# Patient Record
Sex: Male | Born: 1990 | Race: White | Hispanic: No | Marital: Single | Smoking: Current some day smoker
Health system: Southern US, Community
[De-identification: ages and names within clinical notes are randomized; demographics above are authoritative.]

## PROBLEM LIST (undated history)

## (undated) DIAGNOSIS — F4310 Post-traumatic stress disorder, unspecified: Secondary | ICD-10-CM

## (undated) DIAGNOSIS — F329 Major depressive disorder, single episode, unspecified: Secondary | ICD-10-CM

## (undated) DIAGNOSIS — T1491XA Suicide attempt, initial encounter: Secondary | ICD-10-CM

## (undated) DIAGNOSIS — F319 Bipolar disorder, unspecified: Secondary | ICD-10-CM

## (undated) DIAGNOSIS — K219 Gastro-esophageal reflux disease without esophagitis: Secondary | ICD-10-CM

## (undated) DIAGNOSIS — R519 Headache, unspecified: Secondary | ICD-10-CM

## (undated) DIAGNOSIS — E079 Disorder of thyroid, unspecified: Secondary | ICD-10-CM

## (undated) DIAGNOSIS — F32A Depression, unspecified: Secondary | ICD-10-CM

## (undated) DIAGNOSIS — Z87442 Personal history of urinary calculi: Secondary | ICD-10-CM

## (undated) DIAGNOSIS — E039 Hypothyroidism, unspecified: Secondary | ICD-10-CM

## (undated) DIAGNOSIS — R51 Headache: Secondary | ICD-10-CM

## (undated) DIAGNOSIS — T50901A Poisoning by unspecified drugs, medicaments and biological substances, accidental (unintentional), initial encounter: Secondary | ICD-10-CM

## (undated) HISTORY — DX: Poisoning by unspecified drugs, medicaments and biological substances, accidental (unintentional), initial encounter: T50.901A

---
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
## 2010-06-12 LAB — ETHANOL: Alcohol, Ethyl (B): <5 mg/dL (ref 0–10)

## 2010-06-12 LAB — URINALYSIS, ROUTINE W REFLEX MICROSCOPIC
Bilirubin Urine: NEGATIVE
Glucose, UA: NEGATIVE mg/dL
Ketones, ur: NEGATIVE mg/dL
pH: 6 (ref 5.0–8.0)

## 2010-06-12 LAB — CBC
HCT: 45.2 % (ref 39.0–52.0)
Platelets: 217 10*3/uL (ref 150–400)
RDW: 12.6 % (ref 11.5–15.5)

## 2010-06-12 LAB — DIFFERENTIAL
Basophils Absolute: 0.1 10*3/uL (ref 0.0–0.1)
Basophils Relative: 1 % (ref 0–1)
Eosinophils Absolute: 0.5 10*3/uL (ref 0.0–0.7)
Eosinophils Relative: 5 % (ref 0–5)
Lymphocytes Relative: 48 % — ABNORMAL HIGH (ref 12–46)

## 2010-06-12 LAB — POCT I-STAT, CHEM 8
BUN: 13 mg/dL (ref 6–23)
Sodium: 140 mEq/L (ref 135–145)
TCO2: 27 mmol/L (ref 0–100)

## 2010-06-12 LAB — RAPID URINE DRUG SCREEN, HOSP PERFORMED
Benzodiazepines: POSITIVE — AB
Cocaine: NOT DETECTED

## 2010-07-14 ENCOUNTER — Inpatient Hospital Stay: Payer: Self-pay | Admitting: Unknown Physician Specialty
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
## 2010-12-01 LAB — DIFFERENTIAL
Basophils Relative: 0
Lymphocytes Relative: 42
Monocytes Absolute: 0.9
Monocytes Relative: 7
Neutro Abs: 6.4

## 2010-12-01 LAB — DRUGS OF ABUSE SCREEN W/O ALC, ROUTINE URINE
Amphetamine Screen, Ur: NEGATIVE
Barbiturate Quant, Ur: NEGATIVE
Benzodiazepines.: NEGATIVE
Cocaine Metabolites: NEGATIVE
Methadone: NEGATIVE
Phencyclidine (PCP): NEGATIVE

## 2010-12-01 LAB — CBC
HCT: 46.2
Hemoglobin: 15.9
MCHC: 34.3
RBC: 5.32

## 2010-12-01 LAB — URINALYSIS, ROUTINE W REFLEX MICROSCOPIC
Bilirubin Urine: NEGATIVE
Glucose, UA: NEGATIVE
Hgb urine dipstick: NEGATIVE
Ketones, ur: NEGATIVE
pH: 6

## 2010-12-01 LAB — LIPID PANEL
LDL Cholesterol: 92
Triglycerides: 133

## 2010-12-01 LAB — HEPATIC FUNCTION PANEL
AST: 23
Albumin: 3.6
Alkaline Phosphatase: 63
Total Bilirubin: 0.9

## 2010-12-01 LAB — BASIC METABOLIC PANEL
CO2: 24
Calcium: 9.2
Potassium: 3.9
Sodium: 137

## 2010-12-01 LAB — HEMOGLOBIN A1C
Hgb A1c MFr Bld: 5.5
Mean Plasma Glucose: 119

## 2010-12-22 LAB — BASIC METABOLIC PANEL
BUN: 10
Chloride: 105
Creatinine, Ser: 0.81

## 2010-12-22 LAB — DRUGS OF ABUSE SCREEN W/O ALC, ROUTINE URINE
Amphetamine Screen, Ur: NEGATIVE
Creatinine,U: 284.2
Marijuana Metabolite: POSITIVE — AB
Propoxyphene: NEGATIVE

## 2010-12-22 LAB — URINALYSIS, ROUTINE W REFLEX MICROSCOPIC
Ketones, ur: NEGATIVE
Nitrite: NEGATIVE
Specific Gravity, Urine: 1.036 — ABNORMAL HIGH
pH: 6

## 2010-12-22 LAB — DIFFERENTIAL
Basophils Absolute: 0
Basophils Relative: 0
Eosinophils Absolute: 0.2
Neutrophils Relative %: 50

## 2010-12-22 LAB — GC/CHLAMYDIA PROBE AMP, URINE: Chlamydia, Swab/Urine, PCR: NEGATIVE

## 2010-12-22 LAB — OPIATE, QUANTITATIVE, URINE
Hydrocodone: 3000 ng/mL
Hydromorphone GC/MS Conf: NEGATIVE

## 2010-12-22 LAB — HEPATIC FUNCTION PANEL
Bilirubin, Direct: 0.2
Indirect Bilirubin: 0.6

## 2010-12-22 LAB — COMPREHENSIVE METABOLIC PANEL
AST: 25
Albumin: 3.5
Calcium: 9
Creatinine, Ser: 0.82

## 2010-12-22 LAB — TSH: TSH: 1.224

## 2010-12-22 LAB — LIPID PANEL
LDL Cholesterol: 77
Total CHOL/HDL Ratio: 2.8
Triglycerides: 81
VLDL: 16

## 2010-12-22 LAB — GAMMA GT: GGT: 26

## 2010-12-22 LAB — CBC
MCHC: 33.9
MCV: 85.6
Platelets: 282
WBC: 9.6

## 2010-12-22 LAB — T4, FREE: Free T4: 1.17

## 2011-12-09 LAB — COMPREHENSIVE METABOLIC PANEL
Albumin: 3.5 g/dL (ref 3.4–5.0)
Alkaline Phosphatase: 69 U/L (ref 50–136)
Calcium, Total: 7.8 mg/dL — ABNORMAL LOW (ref 8.5–10.1)
Chloride: 111 mmol/L — ABNORMAL HIGH (ref 98–107)
Co2: 24 mmol/L (ref 21–32)
Potassium: 3.6 mmol/L (ref 3.5–5.1)
SGOT(AST): 89 U/L — ABNORMAL HIGH (ref 15–37)
Sodium: 146 mmol/L — ABNORMAL HIGH (ref 136–145)
Total Protein: 6.5 g/dL (ref 6.4–8.2)

## 2011-12-09 LAB — DRUG SCREEN, URINE
Amphetamines, Ur Screen: NEGATIVE (ref ?–1000)
Benzodiazepine, Ur Scrn: NEGATIVE (ref ?–200)
Cannabinoid 50 Ng, Ur ~~LOC~~: NEGATIVE (ref ?–50)
Cocaine Metabolite,Ur ~~LOC~~: NEGATIVE (ref ?–300)
MDMA (Ecstasy)Ur Screen: NEGATIVE (ref ?–500)
Opiate, Ur Screen: NEGATIVE (ref ?–300)
Phencyclidine (PCP) Ur S: NEGATIVE (ref ?–25)

## 2011-12-09 LAB — TSH: Thyroid Stimulating Horm: 1.04 u[IU]/mL

## 2011-12-09 LAB — URINALYSIS, COMPLETE
Blood: NEGATIVE
Ketone: NEGATIVE
Nitrite: NEGATIVE
Protein: NEGATIVE
Specific Gravity: 1.002 (ref 1.003–1.030)
WBC UR: NONE SEEN /HPF (ref 0–5)

## 2011-12-09 LAB — CBC
HGB: 16 g/dL (ref 13.0–18.0)
MCH: 31.4 pg (ref 26.0–34.0)
RBC: 5.08 10*6/uL (ref 4.40–5.90)
WBC: 8.6 10*3/uL (ref 3.8–10.6)

## 2011-12-09 LAB — SALICYLATE LEVEL: Salicylates, Serum: 2.4 mg/dL

## 2011-12-09 LAB — ACETAMINOPHEN LEVEL: Acetaminophen: <2 ug/mL

## 2011-12-10 ENCOUNTER — Inpatient Hospital Stay: Payer: Self-pay | Admitting: Psychiatry

## 2011-12-10 LAB — ETHANOL: Ethanol %: 0.127 % — ABNORMAL HIGH (ref 0.000–0.080)

## 2013-01-02 ENCOUNTER — Ambulatory Visit: Payer: Self-pay

## 2013-01-12 ENCOUNTER — Ambulatory Visit: Payer: Self-pay

## 2013-04-19 ENCOUNTER — Ambulatory Visit: Payer: Self-pay | Admitting: Internal Medicine

## 2013-08-11 ENCOUNTER — Inpatient Hospital Stay: Payer: Self-pay | Admitting: Psychiatry

## 2013-08-11 LAB — COMPREHENSIVE METABOLIC PANEL
AST: 35 U/L (ref 15–37)
Albumin: 4.4 g/dL (ref 3.4–5.0)
Alkaline Phosphatase: 57 U/L
Anion Gap: 9 (ref 7–16)
BUN: 12 mg/dL (ref 7–18)
Bilirubin,Total: 0.3 mg/dL (ref 0.2–1.0)
CALCIUM: 8.6 mg/dL (ref 8.5–10.1)
CHLORIDE: 113 mmol/L — AB (ref 98–107)
CO2: 19 mmol/L — AB (ref 21–32)
Creatinine: 0.69 mg/dL (ref 0.60–1.30)
EGFR (African American): >60
EGFR (Non-African Amer.): >60
GLUCOSE: 103 mg/dL — AB (ref 65–99)
Osmolality: 281 (ref 275–301)
Potassium: 3.5 mmol/L (ref 3.5–5.1)
SGPT (ALT): 68 U/L (ref 12–78)
Sodium: 141 mmol/L (ref 136–145)
Total Protein: 7.7 g/dL (ref 6.4–8.2)

## 2013-08-11 LAB — URINALYSIS, COMPLETE
BACTERIA: NONE SEEN
Bilirubin,UR: NEGATIVE
Blood: NEGATIVE
Glucose,UR: NEGATIVE mg/dL (ref 0–75)
Ketone: NEGATIVE
LEUKOCYTE ESTERASE: NEGATIVE
Nitrite: NEGATIVE
Ph: 7 (ref 4.5–8.0)
Protein: NEGATIVE
RBC,UR: <1 /HPF (ref 0–5)
SPECIFIC GRAVITY: 1.002 (ref 1.003–1.030)
SQUAMOUS EPITHELIAL: NONE SEEN
WBC UR: <1 /HPF (ref 0–5)

## 2013-08-11 LAB — DRUG SCREEN, URINE

## 2013-08-11 LAB — SALICYLATE LEVEL: SALICYLATES, SERUM: 2.2 mg/dL

## 2013-08-11 LAB — CBC
HCT: 51.5 % (ref 40.0–52.0)
HGB: 17.4 g/dL (ref 13.0–18.0)
MCH: 30.1 pg (ref 26.0–34.0)
MCHC: 33.8 g/dL (ref 32.0–36.0)
MCV: 89 fL (ref 80–100)
PLATELETS: 200 10*3/uL (ref 150–440)
RBC: 5.79 10*6/uL (ref 4.40–5.90)
RDW: 14 % (ref 11.5–14.5)
WBC: 14.2 10*3/uL — AB (ref 3.8–10.6)

## 2013-08-11 LAB — ETHANOL
ETHANOL %: 0.151 % — AB (ref 0.000–0.080)
Ethanol: 151 mg/dL

## 2013-08-11 LAB — ACETAMINOPHEN LEVEL

## 2013-09-06 ENCOUNTER — Emergency Department: Payer: Self-pay | Admitting: Emergency Medicine

## 2013-09-06 LAB — CBC WITH DIFFERENTIAL/PLATELET
BASOS ABS: 0.1 10*3/uL (ref 0.0–0.1)
BASOS PCT: 0.4 %
EOS ABS: 0 10*3/uL (ref 0.0–0.7)
EOS PCT: 0 %
HCT: 53.3 % — AB (ref 40.0–52.0)
HGB: 18.3 g/dL — ABNORMAL HIGH (ref 13.0–18.0)
LYMPHS ABS: 2.4 10*3/uL (ref 1.0–3.6)
LYMPHS PCT: 11.3 %
MCH: 30.1 pg (ref 26.0–34.0)
MCHC: 34.4 g/dL (ref 32.0–36.0)
MCV: 88 fL (ref 80–100)
MONO ABS: 0.6 x10 3/mm (ref 0.2–1.0)
Monocyte %: 2.7 %
NEUTROS ABS: 18.1 10*3/uL — AB (ref 1.4–6.5)
NEUTROS PCT: 85.6 %
Platelet: 297 10*3/uL (ref 150–440)
RBC: 6.09 10*6/uL — ABNORMAL HIGH (ref 4.40–5.90)
RDW: 13.4 % (ref 11.5–14.5)
WBC: 21.2 10*3/uL — AB (ref 3.8–10.6)

## 2013-09-06 LAB — URINALYSIS, COMPLETE
Bacteria: NONE SEEN
Bilirubin,UR: NEGATIVE
Blood: NEGATIVE
Glucose,UR: NEGATIVE mg/dL (ref 0–75)
Leukocyte Esterase: NEGATIVE
Nitrite: NEGATIVE
Ph: 6 (ref 4.5–8.0)
Protein: 30
RBC,UR: 1 /HPF (ref 0–5)
Specific Gravity: 1.025 (ref 1.003–1.030)
Squamous Epithelial: 1
WBC UR: 1 /HPF (ref 0–5)

## 2013-09-07 LAB — COMPREHENSIVE METABOLIC PANEL
ALT: 63 U/L (ref 12–78)
ANION GAP: 9 (ref 7–16)
Albumin: 4.1 g/dL (ref 3.4–5.0)
Alkaline Phosphatase: 66 U/L
BILIRUBIN TOTAL: 0.8 mg/dL (ref 0.2–1.0)
BUN: 12 mg/dL (ref 7–18)
CREATININE: 0.8 mg/dL (ref 0.60–1.30)
Calcium, Total: 9.5 mg/dL (ref 8.5–10.1)
Chloride: 105 mmol/L (ref 98–107)
Co2: 25 mmol/L (ref 21–32)
EGFR (Non-African Amer.): >60
Glucose: 116 mg/dL — ABNORMAL HIGH (ref 65–99)
OSMOLALITY: 278 (ref 275–301)
POTASSIUM: 3 mmol/L — AB (ref 3.5–5.1)
SGOT(AST): 42 U/L — ABNORMAL HIGH (ref 15–37)
Sodium: 139 mmol/L (ref 136–145)
Total Protein: 8.1 g/dL (ref 6.4–8.2)

## 2013-09-07 LAB — LIPASE, BLOOD: LIPASE: 87 U/L (ref 73–393)

## 2014-05-01 ENCOUNTER — Emergency Department: Payer: Self-pay | Admitting: Emergency Medicine

## 2014-06-25 NOTE — H&P (Signed)
PATIENT NAME:  [name], [name] MR#:  [id number] DATE OF BIRTH:  1990/09/06  DATE OF ADMISSION:  12/10/2011  IDENTIFYING INFORMATION AND CHIEF COMPLAINT: This is a 24 year old man brought to the Emergency Room by his father because of excessive alcohol abuse.   CHIEF COMPLAINT: "I drank too much yesterday."   HISTORY OF PRESENT ILLNESS: Information obtained from the patient and from the chart. He was brought to the hospital by his father who reported that the patient has been drinking excessively ever since his birthday on 09/25/2011. The patient has been drinking almost constantly. He has become extremely dysfunctional. The patient admits to all of this today and me that he has been going through about four to five of the 1/2 gallon bottles of vodka each week. He has an impression that this has been an excessive amount. He says he does this because it helps him to relax. He admits that it has been causing him difficulties on several fronts. It has disrupted his sleep schedule and made his life schedule generally chaotic, makes him sick to his stomach, makes him feel tired, has cost him a great deal of money and has made it impossible for him to do much of anything else. The patient also claims that he has been using his prescribed medication correctly. He denies that he has been using any other drugs. He says that his mood recently has been mostly okay. He denies any suicidal or homicidal ideation. He denies hallucinations. He denies manic symptoms. He denies psychosis. He says that generally he feels like he gets along fairly well at home. He has been going to see Dr. Janeece RiggersSu for outpatient treatment. The patient claims his current medications are Lamictal 100 mg twice a day, Celexa 40 mg a day, Nexium 40 mg a day, Xanax 1 mg three times daily, and Synthroid 75 mcg a day.   PAST PSYCHIATRIC HISTORY: The patient has had a prior psychiatric hospitalization at our facility twice and has also been evaluated by  psychiatric consult. His history began in childhood and he has a history of attention deficit disorder symptoms and of being given a diagnosis of bipolar disorder as a child. More recently he has a history of abuse of multiple drugs. He has been hospitalized at least once for intentional overdose of drugs in the intention of getting high rather than killing himself. He was here last in May of 2012 and at that time was abusing Valium. It is not clear that he has any serious suicide attempts. He does not appear to have a history of violence in the past.   SOCIAL HISTORY: The patient is living with his biological parents. He not currently in school. He has been at [employer] in the past, but is not currently going to school or working. He says that his heavy drinking prevents him from doing much else. His social life seems to be pretty much limited to staying at home drinking heavily.   PAST MEDICAL HISTORY:  1. Hypothyroidism.  2. Gross obesity.  3. Gastric reflux symptoms.   SUBSTANCE ABUSE HISTORY: As noted above, he has a history of abuse of multiple drugs, especially benzodiazepines. Most recently has started drinking heavily. He seems to have a tendency to become rapidly out of control with substances when he does use them. He does not have a history of DTs or seizures because he has not yet had an opportunity to try to detox.   MEDICATIONS: As noted above, they appear  to be the following. 1. Lamictal 100 mg twice a day.  2. Celexa 40 mg per day.  3. Nexium 40 mg per day.  4. Xanax 1 mg three times daily. (I am not sure if I believe the Xanax. His drug screen is currently negative for benzodiazepines).  5. Synthroid 75 mcg per day.   ALLERGIES: Sulfa drugs and latex.   REVIEW OF SYSTEMS: When I see him today he complains of feeling hot and cold and shaky all over. Feeling very tired. Somewhat confused. Denies that he is having hallucinations. Denies suicidal or homicidal  ideation. Has felt somewhat sick to his stomach and had a hard time keeping food down.   MENTAL STATUS EXAMINATION: Extremely overweight young man. Very disheveled, dirty, and extremely malodorous. Passively cooperative with the interview. Eye contact decreased. Psychomotor activity sluggish. Speech decreased in total amount and slow. Affect constricted, a little blunted, but he does smile appropriately at times. Mood stated as feeling sick. Thoughts are slow, but there is no sign of loosening of associations or delusional content. He denies hallucinations. He denies any suicidal or homicidal ideation. Short and longer term memory both impaired. Overall intelligence appears to probably be about average. Judgment and insight have recently been poor, although he does understand and admits that he has been drinking too much.    PHYSICAL EXAMINATION:   GENERAL: The patient weighs 356 pounds. He is sluggish but does not appear to be in great physical distress. There are no skin lesions identified.   HEENT: Pupils are equal and reactive. Mucous membranes dry. Face is symmetric.   NECK AND BACK: Nontender.   MUSCULOSKELETAL: Full range of motion at all extremities. Strength and reflexes normal, upper and lower.   NEUROLOGICAL: Cranial nerves symmetric and normal.   LUNGS: Clear to auscultation.   HEART: Regular rate and rhythm. No extra sounds.   ABDOMEN: Obese, normal bowel sounds.   VITAL SIGNS: Blood pressure 131/59, respirations 18, pulse 79, and temperature 98.7.   ASSESSMENT: A 24 year old man who as a child was given the diagnoses of attention deficit/hyperactivity disorder and bipolar disorder but who as an adult seems to have primarily presented with substance abuse problems overwhelming everything else. Currently he has been drinking an extraordinary amount and presented to the Emergency Room with a blood alcohol level of almost 300. He currently has normal vital signs and is lucid and  not delirious. He is feeling sick but is not grossly shaky. He is already being treated with the alcohol withdrawal protocol. He needs detox treatment and consideration of further substance abuse treatment.   TREATMENT PLAN: Admit to the hospital and put him on the detox protocol. Continue the other medications, but not the Xanax. His benzodiazepine screen was negative which makes me a little bit less concerned about benzodiazepine withdrawal, but that should be covered by the CIWA protocol as well. Engage him in groups and activities, especially those oriented towards substance abuse. Try and get collateral history with the full treatment team. Engage him in planning for further substance abuse treatment.     DIAGNOSIS PRINCIPLE AND PRIMARY:   AXIS I: Alcohol dependence.   SECONDARY DIAGNOSES:   AXIS I:  1. Benzodiazepine dependence.  2. Bipolar disorder, not otherwise specified.   AXIS II: Deferred.   AXIS III: Morbid obesity, hypothyroidism.   AXIS IV: Severe from the heavy substance abuse, lack of any current social stimulation or activity.   AXIS V: Functioning at time of evaluation is 30. ____________________________  Audery Amel, MD jtc:slb D: 12/10/2011 16:04:00 ET T: 12/10/2011 16:33:08 ET JOB#: [id number]  cc: Audery Amel, MD, <Dictator> Audery Amel MD ELECTRONICALLY SIGNED 12/10/2011 17:10

## 2014-06-25 NOTE — Consult Note (Signed)
was seen for discharge disposition at the request of Attending, Audery AmelJohn T. Clapacs, MD to explore possibility of CD-IOP treatment at this inpatient discharge. Patient was positive for past treatment in the CD-IOP and was able to express desires of returning to treatment in the CD-IOP with goals of developing relapse prevention skills. Based on his history of substance abuse as well as distress in family, his management of emotional distress and his history of inability to remain abstinence the option of residential treatment for substance abuse treatment was also explored and may in fact be the better option upon discharge from this inpatient treatment. He insisted that the CD-IOP would be the best option for him. He was oriented to the CD-IOP. If discharge plans are for patient to participate in the CD-IOP he may began CD-IOP Friday December 17, 2011 at 4PM at which time he will sign all documents for admission into the CD-IOP.    Electronic Signatures: Huel Cotehomas, Richard (PsyD)  (Signed on 08-Oct-13 23:02)  Authored  Last Updated: 08-Oct-13 23:02 by Huel Cotehomas, Richard (PsyD)

## 2014-06-25 NOTE — Discharge Summary (Signed)
PATIENT NAME:  [name], [name] MR#:  [id number] DATE OF BIRTH:  Jul 27, 1990  DATE OF ADMISSION:  12/10/2011 DATE OF DISCHARGE:  12/15/2011  HOSPITAL COURSE: See dictated history and physical for details of admission. This 24 year old man was admitted to the hospital very intoxicated, somewhat agitated. He had been drinking a very large amount of vodka and had rapidly escalated his drinking habit since his 21st birthday a couple of months ago. He had been somewhat aggressive when he came into the hospital, incoherent, disorganized and potentially threatening. Family could not manage him. In the hospital, patient has not been violent or agitated or threatening. He was treated with the standard alcohol detox plan and has tolerated that well. No seizures or delirium tremens developed. He was able to detox readily over a few days with only minor shakiness and vital sign abnormalities. At this time his blood pressure has improved greatly. He was still running a bit of a high blood pressure even after other symptoms of detox had subsided and so was started on a low dose of antihypertensive medicine. He was engaged in daily individual therapy as well as therapy groups and educated about substance abuse problems. His affect and mood became euthymic and appropriate. He was treated with his usual outpatient medicines for depression and tolerated these well. A consultation was obtained with Dr. Maisie Fus from the intensive outpatient program who has worked with the patient before. Dr. Maisie Fus is willing to have the patient return to IOP and that will be the plan for follow up starting this Friday. Patient will also continue to follow up with Dr. Janeece Riggers in the community. We recommended to the patient that he can consider going to a residential program but he declined and is not interested at this point and would prefer to go to outpatient treatment.   LABORATORY, DIAGNOSTIC AND RADIOLOGICAL DATA: Admission labs showed a chemistry  panel with a glucose slightly elevated at 111, BUN low at 4, sodium 146, chloride 111, calcium 7.8, ALT 91, AST 89. Initial alcohol level in the Emergency Room was 296. TSH 1.04. Drug screen negative. Follow-up alcohol level after several hours was down to 127. CBC was normal. Urinalysis unremarkable. Acetaminophen and salicylates negative.   DISCHARGE MEDICATIONS:  1. Lisinopril 10 mg p.o. daily.  2. Trazodone 100 mg p.o. at bedtime.  3. Levothyroxine 75 mcg p.o. q.a.m.  4. Lamictal 100 mg b.i.d.  5. Nexium 40 mg b.i.d.  6. Celexa 40 mg per day.   MENTAL STATUS EXAM AT DISCHARGE: Casually but neatly dressed young man, looks his stated age. Cooperative and pleasant in the interaction. Good eye contact. Normal psychomotor activity. Speech normal in rate, tone, and volume. Affect euthymic, reactive, and appropriate. Mood stated as being much better. Thoughts are lucid with no sign of loosening of associations or delusional thinking. Denies any hallucinations. Denies suicidal or homicidal ideation. Normal intelligence is evident. Normal short-term and longer term memory with improved judgment and insight.   DISPOSITION: Discharge back home with his family. Follow up with IOP and with Dr. Janeece Riggers and also encourage him to get involved with Alcoholics Anonymous as a longer term program.   DIAGNOSIS PRINCIPLE AND PRIMARY:  AXIS I: Alcohol dependence.   SECONDARY DIAGNOSES:  AXIS I:  1. Depression, not otherwise specified, rule out bipolar disorder type II.  2. Benzodiazepine abuse.    AXIS II: Deferred.   AXIS III:  1. Hypertension. 2. Obesity. 3. Gastric reflux symptoms.   AXIS IV: Moderate to severe  stress from lack of a social life or much activity.   AXIS V: Functioning at time of discharge 60.   ____________________________ Audery AmelJohn T. Ilhan Debenedetto, MD jtc:cms D: 12/15/2011 12:36:16 ET T: 12/15/2011 13:09:21 ET JOB#: [id number] cc: Audery AmelJohn T. Rozalia Dino, MD, <Dictator> Audery AmelJOHN T Ankita Newcomer  MD ELECTRONICALLY SIGNED 12/15/2011 16:58

## 2014-06-29 NOTE — Consult Note (Signed)
Brief Consult Note: Diagnosis: Schizoaffective disorder.   Patient was seen by consultant.   Recommend further assessment or treatment.   Orders entered.   Comments: Psychotic patient to be admitted to psychiatry when bed available. No behavioral problems, accepts medication but is very disorganized and hallucinating.  Electronic Signatures: Kristine LineaPucilowska, Augustus Zurawski (MD)  (Signed 06-Jun-15 10:26)  Authored: Brief Consult Note   Last Updated: 06-Jun-15 10:26 by Kristine LineaPucilowska, Darleen Moffitt (MD)

## 2014-06-29 NOTE — H&P (Signed)
PATIENT NAME:  [name], [name] MR#:  [id number] DATE OF BIRTH:  04-Jun-1990  DATE OF ADMISSION:  08/11/2013  REFERRING PHYSICIAN: Emergency Room M.D.   ATTENDING PHYSICIAN: Devlyn Parish B. Jennet MaduroPucilowska, M.D.   IDENTIFYING DATA: [name] is a 24 year old male with history of bipolar disorder, PTSD and substance abuse.   CHIEF COMPLAINT: "I need help."   HISTORY OF PRESENT ILLNESS: [name] has a history of alcohol and opioid dependence. He was at [facility] about a year ago. He maintained sobriety up until one month ago when he relapsed on alcohol and pain killers. He is drinking a case of beer each day.  He has been taking up 300 mg of oxycodone daily, He steals oxycodone from his parents.  His mother has cancer and the father has pain issues as well. He became increasingly depressed and guilty about his substance abuse and decided to come to the hospital for help. He also has a history of bipolar illness and has been treated on and off since the age of 24. For the past two years, he has been in the care of Dr. Janeece RiggersSu at [facility]. He is being prescribed Lamictal and Celexa for bipolar and Xanax 1 mg 3 times a day for PTSD. He endorses many symptoms of depression with poor sleep, decreased appetite, anhedonia, feeling of guilt, hopelessness, worthlessness, poor memory and concentration, low energy, social isolation and decreased interest. He denies feeling suicide. He denies symptoms suggestive of bipolar mania. There are no psychotic symptoms. He denies IV drug use, but in the past, he did use heroin.   PAST PSYCHIATRIC HISTORY: First hospitalization at the age of 24 after an overdose on multiple opioids. He was hospitalized then. He was in the hospital 6 times about.  He went most often for substance abuse, detox or depression. He had one substance abuse rehab treatment for one year ago at [facility].   FAMILY PSYCHIATRIC HISTORY: There is no history of bipolar, substance abuse or  suicide in the family.   PAST MEDICAL HISTORY: None.   ALLERGIES: SULFA AND LATEX.   MEDICATIONS ON ADMISSION: Lamictal 150 mg twice daily, Celexa 40 mg daily, Xanax 1 mg 3 times daily.   SOCIAL HISTORY: Dropped out of school in the eleventh grade. He had some drug charges, went to jail and missed too much school and no GED. He lives with his parents. He has insurance through his parents. He denies any current legal charges.   REVIEW OF SYSTEMS:  CONSTITUTIONAL: Positive for chills from withdrawal and positive for gradual weight gain.  EYES: No double or blurred vision.  ENT: No hearing loss.  RESPIRATORY: No shortness of breath or cough.  CARDIOVASCULAR: No chest pain or orthopnea.  GASTROINTESTINAL: Positive for nausea and diarrhea.  GENITOURINARY: No incontinence or frequency.  ENDOCRINE: No heat or cold intolerance.  LYMPHATIC: No anemia or easy bruising.  INTEGUMENTARY: No acne or rash.  MUSCULOSKELETAL: Positive for muscle aches.  NEUROLOGIC: No tingling or weakness.  PSYCHIATRIC: See history of present illness for details.   PHYSICAL EXAMINATION: VITAL SIGNS:  Blood pressure 138/100, pulse 99, respirations 20, temperature 98.3.  GENERAL: This is an obese, young male in no acute distress.  HEENT: The pupils are equal, round, and reactive to light. Sclerae are anicteric.  NECK: Supple. No thyromegaly.  LUNGS: Clear to auscultation. No dullness to percussion.  HEART: Regular rhythm and rate. No murmurs, rubs, or gallops.  ABDOMEN: Soft, nontender, nondistended. Positive bowel sounds.  MUSCULOSKELETAL: Normal muscle strength in all extremities.  SKIN: No rashes or bruises.  LYMPHATIC: No cervical adenopathy.  NEUROLOGIC: Cranial nerves II through XII are intact.   LABORATORY DATA: Chemistries are within normal limits. Blood alcohol level is 0.151. LFTs within normal limits. Urine tox screen negative for substances. CBC within normal limits except for white blood count of  14.2. Urinalysis is not suggestive of urinary tract infection. Serum acetaminophen and salicylate are low.   MENTAL STATUS EXAMINATION ON ADMISSION: The patient is alert and oriented to person, place, time and situation. He is interviewed in his room he is in bed, feeling physically sick. He maintains limited eye contact. He is poorly groomed. His speech is slow. There is _____  of speech. His mood is depressed with flat affect. Thought process is logical and goal oriented. He denies thoughts of hurting himself or others. There are no delusions or paranoia. There are no auditory or visual hallucinations. His cognition is grossly intact. His registration, recall, short and long-term memory are intact. He is of average intelligence and fund of knowledge. His insight and judgment are poor.   SUICIDE RISK ASSESSMENT ON ADMISSION: This is a patient with a history of depression and mood instability, anxiety and substance use, who came to the hospital asking for opioid dependence and alcohol detox:   INITIAL DIAGNOSES:  AXIS I: Bipolar I disorder, depressed; alcohol dependence, opioid dependence.  AXIS II: Deferred.  AXIS III: Obesity. Opioids and alcohol withdrawal.  AXIS IV: Mental illness, substance abuse.  AXIS V: Global assessment of functioning: 35.   PLAN: The patient was admitted to [facility] unit for safety, stabilization and medication management. He was initially placed on suicide precautions and was closely monitored for any unsafe behaviors. He underwent full psychiatric and risk assessment. He received pharmacotherapy, individual and group psychotherapy, substance abuse counseling, and support from therapeutic milieu.  1. Alcohol detox: He is on the CIWA protocol, we will monitor for symptoms of withdrawal.  2. Opioid detox. He is treated symptomatically for opiates withdrawal. 3.  Mood. We will continue Lamictal and Celexa as prescribed by Dr. Janeece Riggers.   4. Anxiety: The patient claims that he has Xanax for PTSD, we will hold that.  5. Substance abuse treatment: The patient realizes that he should go back to substance abuse rehab, uncertain if he wants to do so.   DISPOSITION: To be established.    ____________________________ Ellin Goodie. Jennet Maduro, MD jbp:sg D: 08/12/2013 11:21:50 ET T: 08/12/2013 11:54:13 ET JOB#: [id number]  cc: Christyanna Mckeon B. Jennet Maduro, MD, <Dictator> Shari Prows MD ELECTRONICALLY SIGNED 08/15/2013 6:59

## 2014-09-24 ENCOUNTER — Emergency Department: Payer: Federal, State, Local not specified - PPO

## 2014-09-24 ENCOUNTER — Encounter: Payer: Self-pay | Admitting: Emergency Medicine

## 2014-09-24 ENCOUNTER — Emergency Department
Admission: EM | Admit: 2014-09-24 | Discharge: 2014-09-24 | Disposition: A | Discharge status: Home / Self Care | Payer: Federal, State, Local not specified - PPO | Attending: Emergency Medicine | Admitting: Emergency Medicine

## 2014-09-24 DIAGNOSIS — R109 Unspecified abdominal pain: Secondary | ICD-10-CM

## 2014-09-24 DIAGNOSIS — N200 Calculus of kidney: Secondary | ICD-10-CM | POA: Insufficient documentation

## 2014-09-24 DIAGNOSIS — Z720 Tobacco use: Secondary | ICD-10-CM | POA: Diagnosis not present

## 2014-09-24 HISTORY — DX: Disorder of thyroid, unspecified: E07.9

## 2014-09-24 LAB — COMPREHENSIVE METABOLIC PANEL
ALK PHOS: 55 U/L (ref 38–126)
ALT: 35 U/L (ref 17–63)
AST: 24 U/L (ref 15–41)
Albumin: 4.8 g/dL (ref 3.5–5.0)
Anion gap: 9 (ref 5–15)
BILIRUBIN TOTAL: 0.9 mg/dL (ref 0.3–1.2)
BUN: 10 mg/dL (ref 6–20)
CO2: 25 mmol/L (ref 22–32)
Calcium: 9 mg/dL (ref 8.9–10.3)
Chloride: 106 mmol/L (ref 101–111)
Creatinine, Ser: 1.04 mg/dL (ref 0.61–1.24)
GFR calc non Af Amer: >60 mL/min (ref >60)
Glucose, Bld: 109 mg/dL — ABNORMAL HIGH (ref 65–99)
POTASSIUM: 3.9 mmol/L (ref 3.5–5.1)
Sodium: 140 mmol/L (ref 135–145)
TOTAL PROTEIN: 7.6 g/dL (ref 6.5–8.1)

## 2014-09-24 LAB — URINALYSIS COMPLETE WITH MICROSCOPIC (ARMC ONLY)
Bilirubin Urine: NEGATIVE
Glucose, UA: NEGATIVE mg/dL
Leukocytes, UA: NEGATIVE
Nitrite: NEGATIVE
Protein, ur: 100 mg/dL — AB
SQUAMOUS EPITHELIAL / LPF: NONE SEEN
Specific Gravity, Urine: 1.024 (ref 1.005–1.030)
pH: 5 (ref 5.0–8.0)

## 2014-09-24 LAB — CBC
HEMATOCRIT: 51.7 % (ref 40.0–52.0)
Hemoglobin: 17.2 g/dL (ref 13.0–18.0)
MCH: 29.5 pg (ref 26.0–34.0)
MCHC: 33.3 g/dL (ref 32.0–36.0)
MCV: 88.6 fL (ref 80.0–100.0)
Platelets: 201 10*3/uL (ref 150–440)
RBC: 5.83 MIL/uL (ref 4.40–5.90)
RDW: 14 % (ref 11.5–14.5)
WBC: 12.7 10*3/uL — ABNORMAL HIGH (ref 3.8–10.6)

## 2014-09-24 MED ORDER — KETOROLAC TROMETHAMINE 10 MG PO TABS
10.0000 mg | ORAL_TABLET | Freq: Once | ORAL | Status: AC
  Administered 2014-09-24: 10 mg via ORAL
  Filled 2014-09-24: qty 1

## 2014-09-24 MED ORDER — KETOROLAC TROMETHAMINE 30 MG/ML IJ SOLN
INTRAMUSCULAR | Status: AC
  Administered 2014-09-24: 30 mg via INTRAVENOUS
  Filled 2014-09-24: qty 1

## 2014-09-24 MED ORDER — KETOROLAC TROMETHAMINE 30 MG/ML IJ SOLN
30.0000 mg | Freq: Once | INTRAMUSCULAR | Status: AC
  Administered 2014-09-24: 30 mg via INTRAVENOUS

## 2014-09-24 MED ORDER — ONDANSETRON HCL 4 MG/2ML IJ SOLN
4.0000 mg | Freq: Once | INTRAMUSCULAR | Status: AC
Start: 2014-09-24 — End: 2014-09-24
  Administered 2014-09-24: 4 mg via INTRAVENOUS

## 2014-09-24 MED ORDER — ONDANSETRON HCL 4 MG/2ML IJ SOLN
4.0000 mg | Freq: Once | INTRAMUSCULAR | Status: AC
  Administered 2014-09-24: 4 mg via INTRAVENOUS

## 2014-09-24 MED ORDER — ONDANSETRON HCL 4 MG/2ML IJ SOLN
INTRAMUSCULAR | Status: AC
  Administered 2014-09-24: 4 mg via INTRAVENOUS
  Filled 2014-09-24: qty 2

## 2014-09-24 MED ORDER — KETOROLAC TROMETHAMINE 10 MG PO TABS: 10.0000 mg | ORAL_TABLET | Freq: Three times a day (TID) | ORAL | Status: DC | PRN

## 2014-09-24 MED ORDER — MORPHINE SULFATE 4 MG/ML IJ SOLN
4.0000 mg | Freq: Once | INTRAMUSCULAR | Status: AC
  Administered 2014-09-24: 4 mg via INTRAVENOUS

## 2014-09-24 MED ORDER — SODIUM CHLORIDE 0.9 % IV BOLUS (SEPSIS)
1000.0000 mL | Freq: Once | INTRAVENOUS | Status: AC
  Administered 2014-09-24: 1000 mL via INTRAVENOUS

## 2014-09-24 MED ORDER — MORPHINE SULFATE 4 MG/ML IJ SOLN
INTRAMUSCULAR | Status: AC
  Administered 2014-09-24: 4 mg via INTRAVENOUS
  Filled 2014-09-24: qty 1

## 2014-09-24 NOTE — ED Notes (Signed)

## 2014-09-24 NOTE — Discharge Instructions (Signed)

## 2014-09-24 NOTE — ED Notes (Signed)
Attempted IV access x2 unsuccessful 

## 2014-09-24 NOTE — ED Notes (Signed)
Patient transported to CT 

## 2014-09-24 NOTE — ED Notes (Addendum)
Patient present to ED with complaint of back pain since 10 pm last night, reports vomited x 1 here. Patient states was sleeping when back pain woke him up. Patient diaphoretic and shivering during triage. Patient has even and unlabored respirations. Patient denies history of kidney stone or gallstones. Patient alert and oriented x 4, call bell within reach.

## 2014-09-24 NOTE — ED Provider Notes (Signed)
[facility] Emergency Department Provider Note  ____________________________________________  Time seen: 1:00 AM  I have reviewed the triage vital signs and the nursing notes.   HISTORY  Chief Complaint Back Pain and Emesis     HPI [name] is a 24 y.o. male presents with acute onset of right back/flank pain at 10 PM accompanied by one episode of nonbloody emesis. He denies any fever no diarrhea or constipation. Patient denies any dysuria or hematuria     Past Medical History  Diagnosis Date  . Thyroid disease     There are no active problems to display for this patient.   History reviewed. No pertinent past surgical history.  Current Outpatient Rx  Name  Route  Sig  Dispense  Refill  . ketorolac (TORADOL) 10 MG tablet   Oral   Take 1 tablet (10 mg total) by mouth every 8 (eight) hours as needed.   20 tablet   0     Allergies Sulfa antibiotics  No family history on file.  Social History History  Substance Use Topics  . Smoking status: Current Some Day Smoker -- 1.00 packs/day    Types: Cigarettes  . Smokeless tobacco: Not on file  . Alcohol Use: No    Review of Systems  Constitutional: Negative for fever. Eyes: Negative for visual changes. ENT: Negative for sore throat. Cardiovascular: Negative for chest pain. Respiratory: Negative for shortness of breath. Gastrointestinal: Positive for right flank pain and vomiting Genitourinary: Negative for dysuria. Musculoskeletal: Positive for back pain. Skin: Negative for rash. Neurological: Negative for headaches, focal weakness or numbness.   10-point ROS otherwise negative.  ____________________________________________   PHYSICAL EXAM:  VITAL SIGNS: ED Triage Vitals  Enc Vitals Group     BP 09/24/14 0041 135/83 mmHg     Pulse Rate 09/24/14 0041 87     Resp 09/24/14 0041 20     Temp 09/24/14 0041 97.4 F (36.3 C)     Temp Source 09/24/14 0041 Oral     SpO2  09/24/14 0041 97 %     Weight 09/24/14 0041 320 lb (145.151 kg)     Height 09/24/14 0041  (1.702 m)     Head Cir --      Peak Flow --      Pain Score 09/24/14 0042 10     Pain Loc --      Pain Edu? --      Excl. in GC? --      Constitutional: Alert and oriented. Apparent distress Eyes: Conjunctivae are normal. PERRL. Normal extraocular movements. ENT   Head: Normocephalic and atraumatic.   Nose: No congestion/rhinnorhea.   Mouth/Throat: Mucous membranes are moist.   Neck: No stridor. Hematological/Lymphatic/Immunilogical: No cervical lymphadenopathy. Cardiovascular: Normal rate, regular rhythm. Normal and symmetric distal pulses are present in all extremities. No murmurs, rubs, or gallops. Respiratory: Normal respiratory effort without tachypnea nor retractions. Breath sounds are clear and equal bilaterally. No wheezes/rales/rhonchi. Gastrointestinal: Soft and nontender. No distention. There is no CVA tenderness. Genitourinary: deferred Musculoskeletal: Nontender with normal range of motion in all extremities. No joint effusions.  No lower extremity tenderness nor edema. Neurologic:  Normal speech and language. No gross focal neurologic deficits are appreciated. Speech is normal.  Skin:  Skin is warm, dry and intact. No rash noted. Psychiatric: Mood and affect are normal. Speech and behavior are normal. Patient exhibits appropriate insight and judgment.  ____________________________________________    LABS (pertinent positives/negatives)  Labs Reviewed  CBC -  Abnormal; Notable for the following:    WBC 12.7 (*)    All other components within normal limits  COMPREHENSIVE METABOLIC PANEL - Abnormal; Notable for the following:    Glucose, Bld 109 (*)    All other components within normal limits  URINALYSIS COMPLETEWITH MICROSCOPIC ([facility] ONLY) - Abnormal; Notable for the following:    Color, Urine AMBER (*)    APPearance HAZY (*)    Ketones, ur TRACE (*)     Hgb urine dipstick 3+ (*)    Protein, ur 100 (*)    Bacteria, UA RARE (*)    All other components within normal limits      RADIOLOGY  CT abdomen and pelvis revealed:  IMPRESSION: Minimal right-sided hydronephrosis, with an obstructing 5 mm stone noted in the distal right ureter, just above the right vesicoureteral junction.   Electronically Signed By: Roanna RaiderJeffery Chang M.D. On: 09/24/2014 02:18     INITIAL IMPRESSION / ASSESSMENT AND PLAN / ED COURSE  Pertinent labs & imaging results that were available during my care of the patient were reviewed by me and considered in my medical decision making (see chart for details).  History physical exam CAT scan consistent with right kidney stone. Patient received IV morphine and Zofran. Pain persisted as such patient received IV Toradol 30 mg as improvement of pain  ____________________________________________   FINAL CLINICAL IMPRESSION(S) / ED DIAGNOSES  Final diagnoses:  Right flank pain  Right kidney stone      Darci Currentandolph N Brown, MD 09/24/14 [phone]

## 2014-10-01 ENCOUNTER — Encounter: Payer: Self-pay | Admitting: Emergency Medicine

## 2014-10-01 ENCOUNTER — Emergency Department: Payer: Federal, State, Local not specified - PPO

## 2014-10-01 ENCOUNTER — Emergency Department
Admission: EM | Admit: 2014-10-01 | Discharge: 2014-10-01 | Disposition: A | Discharge status: Home / Self Care | Payer: Federal, State, Local not specified - PPO | Attending: Emergency Medicine | Admitting: Emergency Medicine

## 2014-10-01 DIAGNOSIS — R109 Unspecified abdominal pain: Secondary | ICD-10-CM | POA: Diagnosis present

## 2014-10-01 DIAGNOSIS — Z79899 Other long term (current) drug therapy: Secondary | ICD-10-CM | POA: Insufficient documentation

## 2014-10-01 DIAGNOSIS — Z720 Tobacco use: Secondary | ICD-10-CM | POA: Insufficient documentation

## 2014-10-01 DIAGNOSIS — N200 Calculus of kidney: Secondary | ICD-10-CM | POA: Diagnosis not present

## 2014-10-01 LAB — URINALYSIS COMPLETE WITH MICROSCOPIC (ARMC ONLY)
BACTERIA UA: NONE SEEN
BILIRUBIN URINE: NEGATIVE
Glucose, UA: NEGATIVE mg/dL
LEUKOCYTES UA: NEGATIVE
NITRITE: NEGATIVE
PH: 5 (ref 5.0–8.0)
Protein, ur: 30 mg/dL — AB
SQUAMOUS EPITHELIAL / LPF: NONE SEEN
Specific Gravity, Urine: 1.027 (ref 1.005–1.030)

## 2014-10-01 LAB — BASIC METABOLIC PANEL
Anion gap: 10 (ref 5–15)
BUN: 15 mg/dL (ref 6–20)
CO2: 24 mmol/L (ref 22–32)
Calcium: 9.3 mg/dL (ref 8.9–10.3)
Chloride: 106 mmol/L (ref 101–111)
Creatinine, Ser: 1.29 mg/dL — ABNORMAL HIGH (ref 0.61–1.24)
GFR calc non Af Amer: >60 mL/min (ref >60)
Glucose, Bld: 93 mg/dL (ref 65–99)
POTASSIUM: 3.7 mmol/L (ref 3.5–5.1)
Sodium: 140 mmol/L (ref 135–145)

## 2014-10-01 MED ORDER — SODIUM CHLORIDE 0.9 % IV BOLUS (SEPSIS)
1000.0000 mL | Freq: Once | INTRAVENOUS | Status: AC
  Administered 2014-10-01: 1000 mL via INTRAVENOUS

## 2014-10-01 MED ORDER — KETOROLAC TROMETHAMINE 30 MG/ML IJ SOLN
30.0000 mg | Freq: Once | INTRAMUSCULAR | Status: AC
  Administered 2014-10-01: 30 mg via INTRAVENOUS

## 2014-10-01 MED ORDER — OXYCODONE-ACETAMINOPHEN 10-325 MG PO TABS: 1.0000 | ORAL_TABLET | Freq: Four times a day (QID) | ORAL | Status: DC | PRN

## 2014-10-01 MED ORDER — ONDANSETRON HCL 4 MG/2ML IJ SOLN
INTRAMUSCULAR | Status: AC
  Administered 2014-10-01: 4 mg via INTRAVENOUS
  Filled 2014-10-01: qty 2

## 2014-10-01 MED ORDER — ONDANSETRON HCL 4 MG/2ML IJ SOLN
4.0000 mg | Freq: Once | INTRAMUSCULAR | Status: AC
  Administered 2014-10-01: 4 mg via INTRAVENOUS

## 2014-10-01 MED ORDER — KETOROLAC TROMETHAMINE 30 MG/ML IJ SOLN
INTRAMUSCULAR | Status: AC
  Administered 2014-10-01: 30 mg via INTRAVENOUS
  Filled 2014-10-01: qty 15

## 2014-10-01 MED ORDER — HYDROMORPHONE HCL 1 MG/ML IJ SOLN
INTRAMUSCULAR | Status: AC
  Administered 2014-10-01: 1 mg via INTRAVENOUS
  Filled 2014-10-01: qty 1

## 2014-10-01 MED ORDER — HYDROMORPHONE HCL 1 MG/ML IJ SOLN
1.0000 mg | Freq: Once | INTRAMUSCULAR | Status: AC
  Administered 2014-10-01: 1 mg via INTRAVENOUS

## 2014-10-01 NOTE — ED Provider Notes (Signed)
[facility] Emergency Department Provider Note  ____________________________________________  Time seen: 5:30 AM  I have reviewed the triage vital signs and the nursing notes.   HISTORY  Chief Complaint Flank Pain      HPI [name] is a 24 y.o. male presents with worsening right flank pain and vomiting since last night. Patient recently diagnosed on 09/24/2014 with a 5 mm distal right ureter stone. Of note patient has an appointment Dr. Evelene Croon on Thursday. Patient states that he is unable to tolerate by mouth meds secondary to pain and vomiting     Past Medical History  Diagnosis Date  . Thyroid disease     There are no active problems to display for this patient.   History reviewed. No pertinent past surgical history.  Current Outpatient Rx  Name  Route  Sig  Dispense  Refill  . citalopram (CELEXA) 40 MG tablet   Oral   Take 40 mg by mouth daily.         Marland Kitchen esomeprazole (NEXIUM) 40 MG capsule   Oral   Take 40 mg by mouth daily at 12 noon.         Marland Kitchen ketorolac (TORADOL) 10 MG tablet   Oral   Take 1 tablet (10 mg total) by mouth every 8 (eight) hours as needed.   20 tablet   0   . lamoTRIgine (LAMICTAL) 150 MG tablet   Oral   Take 150 mg by mouth 2 (two) times daily.         Marland Kitchen levothyroxine (SYNTHROID, LEVOTHROID) 25 MCG tablet   Oral   Take 25 mcg by mouth daily before breakfast.         . naltrexone (DEPADE) 50 MG tablet   Oral   Take 25 mg by mouth daily.           Allergies Sulfa antibiotics  History reviewed. No pertinent family history.  Social History History  Substance Use Topics  . Smoking status: Current Some Day Smoker -- 1.00 packs/day    Types: Cigarettes  . Smokeless tobacco: Not on file  . Alcohol Use: No    Review of Systems  Constitutional: Negative for fever. Eyes: Negative for visual changes. ENT: Negative for sore throat. Cardiovascular: Negative for chest pain. Respiratory:  Negative for shortness of breath. Gastrointestinal: Positive for abdominal pain & vomiting. Genitourinary: Positive for dysuria. Musculoskeletal: Negative for back pain. Positive for right flank pain Skin: Negative for rash. Neurological: Negative for headaches, focal weakness or numbness.   10-point ROS otherwise negative.  ____________________________________________   PHYSICAL EXAM:  VITAL SIGNS: ED Triage Vitals  Enc Vitals Group     BP 10/01/14 0429 117/65 mmHg     Pulse Rate 10/01/14 0429 95     Resp 10/01/14 0429 18     Temp 10/01/14 0429 97.9 F (36.6 C)     Temp Source 10/01/14 0429 Oral     SpO2 10/01/14 0429 100 %     Weight 10/01/14 0429 320 lb (145.151 kg)     Height 10/01/14 0429  (1.702 m)     Head Cir --      Peak Flow --      Pain Score 10/01/14 0428 10     Pain Loc --      Pain Edu? --      Excl. in GC? --     Constitutional: Alert and oriented. Apparent distress Eyes: Conjunctivae are normal. PERRL. Normal extraocular movements. ENT  Head: Normocephalic and atraumatic.   Nose: No congestion/rhinnorhea.   Mouth/Throat: Mucous membranes are moist.   Neck: No stridor. Hematological/Lymphatic/Immunilogical: No cervical lymphadenopathy. Cardiovascular: Normal rate, regular rhythm. Normal and symmetric distal pulses are present in all extremities. No murmurs, rubs, or gallops. Respiratory: Normal respiratory effort without tachypnea nor retractions. Breath sounds are clear and equal bilaterally. No wheezes/rales/rhonchi. Gastrointestinal: Soft and nontender. No distention. There is no CVA tenderness. Genitourinary: deferred Musculoskeletal: Nontender with normal range of motion in all extremities. No joint effusions.  No lower extremity tenderness nor edema. Neurologic:  Normal speech and language. No gross focal neurologic deficits are appreciated. Speech is normal.  Skin:  Skin is warm, dry and intact. No rash noted. Psychiatric: Mood  and affect are normal. Speech and behavior are normal. Patient exhibits appropriate insight and judgment.  ____________________________________________    LABS (pertinent positives/negatives)  Labs Reviewed  BASIC METABOLIC PANEL - Abnormal; Notable for the following:    Creatinine, Ser 1.29 (*)    All other components within normal limits  URINALYSIS COMPLETEWITH MICROSCOPIC ([facility] ONLY)      RADIOLOGY  DG Abd 1 View (Final result) Result time: 10/01/14 07:04:39   Final result by Rad Results In Interface (10/01/14 07:04:39)   Narrative:   CLINICAL DATA: Initial evaluation for acute right kidney stone.  EXAM: ABDOMEN - 1 VIEW  COMPARISON: Prior CT from 09/24/2014.  FINDINGS: Persistent small 4-5 mm calcification overlying the right hemipelvis, suspicious for persistent distal right ureteral stone. No other calcifications along the expected course of the right renal collecting system. Left-sided vascular phlebolith noted. No left-sided calculi identified.  Visualized bowel gas pattern within normal limits.  No acute osseus abnormality.  IMPRESSION: Persistent 4-5 mm calcific density overlying the right hemipelvis, suspicious for persistent distal right ureteral stone given the patient's symptoms.   Electronically Signed By: Rise Mu M.D. On: 10/01/2014 07:04       INITIAL IMPRESSION / ASSESSMENT AND PLAN / ED COURSE  Pertinent labs & imaging results that were available during my care of the patient were reviewed by me and considered in my medical decision making (see chart for details).  Patient received Dilaudid and Zofran for analgesia and antiemetic   ____________________________________________   FINAL CLINICAL IMPRESSION(S) / ED DIAGNOSES  Final diagnoses:  Right kidney stone      Darci Current, MD 10/01/14 2250

## 2014-10-01 NOTE — ED Notes (Addendum)
Pt to triage via w/c, appears uncomfortable, grimacing; st dx with kidney stone here; has appt with Dr Evelene Croon Thursday am; st having right flank pain radiating in abdomen accomp by N/V

## 2014-10-01 NOTE — Discharge Instructions (Signed)

## 2014-11-12 ENCOUNTER — Encounter: Payer: Self-pay | Admitting: Emergency Medicine

## 2014-11-12 ENCOUNTER — Emergency Department: Payer: Federal, State, Local not specified - PPO

## 2014-11-12 ENCOUNTER — Inpatient Hospital Stay
Admission: EM | Admit: 2014-11-12 | Discharge: 2014-11-25 | DRG: 981 | Discharge status: Against Medical Advice | Payer: Federal, State, Local not specified - PPO | Attending: Internal Medicine | Admitting: Internal Medicine

## 2014-11-12 DIAGNOSIS — E039 Hypothyroidism, unspecified: Secondary | ICD-10-CM | POA: Diagnosis present

## 2014-11-12 DIAGNOSIS — K5900 Constipation, unspecified: Secondary | ICD-10-CM | POA: Diagnosis present

## 2014-11-12 DIAGNOSIS — T50901A Poisoning by unspecified drugs, medicaments and biological substances, accidental (unintentional), initial encounter: Secondary | ICD-10-CM | POA: Diagnosis present

## 2014-11-12 DIAGNOSIS — R0602 Shortness of breath: Secondary | ICD-10-CM

## 2014-11-12 DIAGNOSIS — J690 Pneumonitis due to inhalation of food and vomit: Secondary | ICD-10-CM | POA: Diagnosis present

## 2014-11-12 DIAGNOSIS — F840 Autistic disorder: Secondary | ICD-10-CM | POA: Diagnosis present

## 2014-11-12 DIAGNOSIS — E876 Hypokalemia: Secondary | ICD-10-CM | POA: Diagnosis not present

## 2014-11-12 DIAGNOSIS — F17210 Nicotine dependence, cigarettes, uncomplicated: Secondary | ICD-10-CM | POA: Diagnosis present

## 2014-11-12 DIAGNOSIS — J80 Acute respiratory distress syndrome: Secondary | ICD-10-CM | POA: Diagnosis present

## 2014-11-12 DIAGNOSIS — Z6841 Body Mass Index (BMI) 40.0 and over, adult: Secondary | ICD-10-CM

## 2014-11-12 DIAGNOSIS — T424X2A Poisoning by benzodiazepines, intentional self-harm, initial encounter: Secondary | ICD-10-CM | POA: Diagnosis not present

## 2014-11-12 DIAGNOSIS — E870 Hyperosmolality and hypernatremia: Secondary | ICD-10-CM | POA: Diagnosis present

## 2014-11-12 DIAGNOSIS — Z4659 Encounter for fitting and adjustment of other gastrointestinal appliance and device: Secondary | ICD-10-CM

## 2014-11-12 DIAGNOSIS — J9602 Acute respiratory failure with hypercapnia: Secondary | ICD-10-CM | POA: Diagnosis present

## 2014-11-12 DIAGNOSIS — I959 Hypotension, unspecified: Secondary | ICD-10-CM | POA: Diagnosis present

## 2014-11-12 DIAGNOSIS — R001 Bradycardia, unspecified: Secondary | ICD-10-CM | POA: Diagnosis present

## 2014-11-12 DIAGNOSIS — E8770 Fluid overload, unspecified: Secondary | ICD-10-CM | POA: Diagnosis present

## 2014-11-12 DIAGNOSIS — A419 Sepsis, unspecified organism: Secondary | ICD-10-CM | POA: Diagnosis present

## 2014-11-12 DIAGNOSIS — J9690 Respiratory failure, unspecified, unspecified whether with hypoxia or hypercapnia: Secondary | ICD-10-CM

## 2014-11-12 DIAGNOSIS — J9600 Acute respiratory failure, unspecified whether with hypoxia or hypercapnia: Secondary | ICD-10-CM

## 2014-11-12 DIAGNOSIS — J9601 Acute respiratory failure with hypoxia: Secondary | ICD-10-CM | POA: Diagnosis present

## 2014-11-12 DIAGNOSIS — R1319 Other dysphagia: Secondary | ICD-10-CM | POA: Diagnosis present

## 2014-11-12 DIAGNOSIS — F1210 Cannabis abuse, uncomplicated: Secondary | ICD-10-CM | POA: Diagnosis present

## 2014-11-12 DIAGNOSIS — R40243 Glasgow coma scale score 3-8: Secondary | ICD-10-CM | POA: Diagnosis present

## 2014-11-12 DIAGNOSIS — R197 Diarrhea, unspecified: Secondary | ICD-10-CM | POA: Diagnosis present

## 2014-11-12 DIAGNOSIS — Z452 Encounter for adjustment and management of vascular access device: Secondary | ICD-10-CM

## 2014-11-12 DIAGNOSIS — Y92003 Bedroom of unspecified non-institutional (private) residence as the place of occurrence of the external cause: Secondary | ICD-10-CM

## 2014-11-12 DIAGNOSIS — D6489 Other specified anemias: Secondary | ICD-10-CM | POA: Diagnosis present

## 2014-11-12 DIAGNOSIS — R031 Nonspecific low blood-pressure reading: Secondary | ICD-10-CM | POA: Diagnosis present

## 2014-11-12 DIAGNOSIS — R451 Restlessness and agitation: Secondary | ICD-10-CM | POA: Diagnosis present

## 2014-11-12 DIAGNOSIS — J15211 Pneumonia due to Methicillin susceptible Staphylococcus aureus: Secondary | ICD-10-CM | POA: Diagnosis present

## 2014-11-12 DIAGNOSIS — G92 Toxic encephalopathy: Secondary | ICD-10-CM | POA: Diagnosis present

## 2014-11-12 DIAGNOSIS — F4310 Post-traumatic stress disorder, unspecified: Secondary | ICD-10-CM | POA: Diagnosis present

## 2014-11-12 DIAGNOSIS — R6520 Severe sepsis without septic shock: Secondary | ICD-10-CM | POA: Diagnosis present

## 2014-11-12 DIAGNOSIS — T17890A Other foreign object in other parts of respiratory tract causing asphyxiation, initial encounter: Secondary | ICD-10-CM | POA: Diagnosis present

## 2014-11-12 DIAGNOSIS — T50904A Poisoning by unspecified drugs, medicaments and biological substances, undetermined, initial encounter: Secondary | ICD-10-CM

## 2014-11-12 DIAGNOSIS — F319 Bipolar disorder, unspecified: Secondary | ICD-10-CM | POA: Diagnosis present

## 2014-11-12 LAB — CBC
HEMATOCRIT: 44.6 % (ref 40.0–52.0)
HEMOGLOBIN: 15 g/dL (ref 13.0–18.0)
MCH: 30.1 pg (ref 26.0–34.0)
MCHC: 33.6 g/dL (ref 32.0–36.0)
MCV: 89.4 fL (ref 80.0–100.0)
Platelets: 167 10*3/uL (ref 150–440)
RBC: 4.98 MIL/uL (ref 4.40–5.90)
RDW: 14.1 % (ref 11.5–14.5)
WBC: 10.6 10*3/uL (ref 3.8–10.6)

## 2014-11-12 NOTE — ED Notes (Signed)
Pt presents to ED via EMS from personal home with c/o of drug overdose. EMS states pt was found on floor face down, conscious but not alert and oriented. 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 EMS states they are unknown to consumed substance this evening. Pt arrived to ER noted sleeping soundly, difficult to arouse.

## 2014-11-12 NOTE — ED Provider Notes (Signed)
[facility] Emergency Department Provider Note  ____________________________________________  Time seen: Approximately 11:22 PM  I have reviewed the triage vital signs and the nursing notes.   HISTORY  Chief Complaint Drug Overdose  Limited by unresponsiveness  HPI [name] is a 24 y.o. male with a history of "high functioning autism" and morbid obesity who presents by EMS after they were called out by family for possible drug overdose.  The patient was found face down on the floor, apparently conscious but not responding more alert.  The family does not know what if any substances he might have taken.  He does admit to marijuana use to the family, but it is unknown if he smoked it today.  Upon arrival in the emergency department he is soundly asleep and sonorous. He has been "down" an unknown period of time, unknown speed of onset, but his condition is severe at this point.  EMS reports that he was at SPO2 88% on room air.  In the emergency department on 4 L nasal cannulae he is 94%.   Past Medical History  Diagnosis Date  . Thyroid disease     Patient Active Problem List   Diagnosis Date Noted  . Respiratory failure 11/13/2014    History reviewed. No pertinent past surgical history.  Current Outpatient Rx  Name  Route  Sig  Dispense  Refill  . citalopram (CELEXA) 40 MG tablet   Oral   Take 40 mg by mouth daily.         Marland Kitchen esomeprazole (NEXIUM) 40 MG capsule   Oral   Take 40 mg by mouth daily at 12 noon.         Marland Kitchen ketorolac (TORADOL) 10 MG tablet   Oral   Take 1 tablet (10 mg total) by mouth every 8 (eight) hours as needed.   20 tablet   0   . lamoTRIgine (LAMICTAL) 150 MG tablet   Oral   Take 150 mg by mouth 2 (two) times daily.         Marland Kitchen levothyroxine (SYNTHROID, LEVOTHROID) 25 MCG tablet   Oral   Take 25 mcg by mouth daily before breakfast.         . naltrexone (DEPADE) 50 MG tablet   Oral   Take 25 mg by mouth  daily.         Marland Kitchen oxyCODONE-acetaminophen (PERCOCET) 10-325 MG per tablet   Oral   Take 1 tablet by mouth every 6 (six) hours as needed for pain.   20 tablet   0     Allergies Sulfa antibiotics  No family history on file.  Social History Social History  Substance Use Topics  . Smoking status: Current Some Day Smoker -- 1.00 packs/day    Types: Cigarettes  . Smokeless tobacco: None  . Alcohol Use: No    Review of Systems Unable to obtain secondary to obtundation  ____________________________________________   PHYSICAL EXAM:  VITAL SIGNS: ED Triage Vitals  Enc Vitals Group     BP 11/12/14 2310 102/91 mmHg     Pulse Rate 11/12/14 2310 72     Resp 11/12/14 2310 17     Temp 11/12/14 2310 97.6 F (36.4 C)     Temp Source 11/12/14 2310 Axillary     SpO2 11/12/14 2310 94 %     Weight 11/12/14 2310 317 lb 7.4 oz (144 kg)     Height 11/12/14 2310 5\' 8"  (1.727 m)     Head  Cir --      Peak Flow --      Pain Score --      Pain Loc --      Pain Edu? --      Excl. in GC? --     Constitutional: Morbidly obese, obtunded, snoring Eyes: Conjunctivae are normal.  Sluggish and minimally responsive pupils.  Random eye movements when the lids are forcibly opened. Head: Atraumatic. Nose: No congestion/rhinnorhea. Mouth/Throat: Mucous membranes are moist.  Oropharynx non-erythematous.  No gag reflex with tongue depressor Neck: No stridor.   Cardiovascular: Normal rate, regular rhythm. Grossly normal heart sounds.  Good peripheral circulation. Respiratory: Normal respiratory effort.  No retractions. Lungs CTAB. Gastrointestinal: Soft, obese, and nontender. No distention. No abdominal bruits. No CVA tenderness. Musculoskeletal: No lower extremity tenderness nor edema.  No joint effusions. Neurologic:  Moving all 4 extremities in a jerking manner and randomly, pulling at EKG wires.  Unable to otherwise assess neurological status.  GCS 6 for minimal withdrawal to pain Skin:  Skin  is warm, dry and intact. No rash noted.   ____________________________________________   LABS (all labs ordered are listed, but only abnormal results are displayed)  Labs Reviewed  COMPREHENSIVE METABOLIC PANEL - Abnormal; Notable for the following:    Calcium 8.5 (*)    Total Protein 6.2 (*)    All other components within normal limits  ACETAMINOPHEN LEVEL - Abnormal; Notable for the following:    Acetaminophen (Tylenol), Serum <10 (*)    All other components within normal limits  URINE DRUG SCREEN, QUALITATIVE ([facility] ONLY) - Abnormal; Notable for the following:    Cannabinoid 50 Ng, Ur [place] POSITIVE (*)    Benzodiazepine, Ur Scrn POSITIVE (*)    All other components within normal limits  BLOOD GAS, ARTERIAL - Abnormal; Notable for the following:    pH, Arterial 7.26 (*)    pCO2 arterial 56 (*)    pO2, Arterial 68 (*)    Acid-base deficit 2.8 (*)    Allens test (pass/fail) POSITIVE (*)    All other components within normal limits  ETHANOL  SALICYLATE LEVEL  CBC  MAGNESIUM  TROPONIN I  LACTIC ACID, PLASMA  URINALYSIS COMPLETEWITH MICROSCOPIC ([facility] ONLY)  ____________________________________________  EKG  ED ECG REPORT #1 I, Anvith Mauriello, the attending physician, personally viewed and interpreted this ECG.   Date: 11/12/2014  EKG Time: 23:29  Rate: 142  Rhythm: Sinus rhythm, but extensive artifact makes this an invalid ECG  Axis: Unable to determine  Intervals:Unable to determine  ST&T Change: Unable to appreciate.  Extensive artifact requires at the ECG is performed again.  This one is somewhat concerning for torsades but I do not believe it is true.  ED ECG REPORT #2 I, Matei Magnone, the attending physician, personally viewed and interpreted this ECG.  Date: 11/12/2014 EKG Time: 23:34 Rate: 65 Rhythm: normal sinus rhythm QRS Axis: normal Intervals: normal ST/T Wave abnormalities: normal Conduction Disutrbances: none Narrative Interpretation:  unremarkable   ____________________________________________  RADIOLOGY   Ct Head Wo Contrast  11/13/2014   CLINICAL DATA:  History drug overdose. Unresponsive. Found down on floor. Difficult to arouse.  EXAM: CT HEAD WITHOUT CONTRAST  TECHNIQUE: Contiguous axial images were obtained from the base of the skull through the vertex without intravenous contrast.  COMPARISON:  04/19/2013  FINDINGS: Ventricles and sulci appear symmetrical. No mass effect or midline shift. No abnormal extra-axial fluid collections. Gray-white matter junctions are distinct. Basal cisterns are not effaced. No evidence of  acute intracranial hemorrhage. No depressed skull fractures. Visualized paranasal sinuses and mastoid air cells are not opacified.  IMPRESSION: No acute intracranial abnormalities.   Electronically Signed   By: Burman Nieves M.D.   On: 11/13/2014 00:50   Dg Chest Portable 1 View  11/13/2014   CLINICAL DATA:  Post intubation  EXAM: PORTABLE CHEST - 1 VIEW  COMPARISON:  06/30/2009  FINDINGS: Interval placement of an endotracheal tube with tip measuring 3.2 cm above the carina. Shallow inspiration. Heart size and pulmonary vascularity are normal for technique. No focal airspace disease or consolidation in the lungs. No blunting of costophrenic angles. No pneumothorax.  IMPRESSION: Endotracheal tube tip measures 3.2 cm above the carina. Shallow inspiration. No evidence of active pulmonary disease.   Electronically Signed   By: Burman Nieves M.D.   On: 11/13/2014 02:19    ____________________________________________   PROCEDURES  Procedure(s) performed: intubation, see procedure note(s).   INTUBATION Performed by: Loleta Rose  Required items: required blood products, implants, devices, and special equipment available Patient identity confirmed: provided demographic data and hospital-assigned identification number Time out: Immediately prior to procedure a "time out" was called to verify the correct  patient, procedure, equipment, support staff and site/side marked as required.  Indications: airway protection, GCS 6  Intubation method: Glidescope Laryngoscopy   Preoxygenation: NRB and passive  oxygenation during procedure  Sedatives: Etomidate 20 mg IV Paralytic: Succinylcholine 140 mg IV  Tube Size: 8.0 cuffed (placed at 22 cm at teeth)  Post-procedure assessment: chest rise and ETCO2 monitor Breath sounds: equal and absent over the epigastrium Tube secured with: ETT holder Chest x-ray interpreted by radiologist and me.  Chest x-ray findings: endotracheal tube in appropriate position  Patient tolerated the procedure well.  Extensive oral secretions were present during the intubation even after suctioning.  He was mildly bradycardic prior to the procedure with a heart rate around 50.  His heart rate dropped to 39 during the procedure and I gave him atropine 1 mg IV which improved his heart rate back up to the 60s.    Critical Care performed: Yes, see critical care note(s)   CRITICAL CARE Performed by: Loleta Rose   Total critical care time: 45 minutes  Critical care time was exclusive of separately billable procedures and treating other patients.  Critical care was necessary to treat or prevent imminent or life-threatening deterioration.  Critical care was time spent personally by me on the following activities: development of treatment plan with patient and/or surrogate as well as nursing, discussions with consultants, evaluation of patient's response to treatment, examination of patient, obtaining history from patient or surrogate, ordering and performing treatments and interventions, ordering and review of laboratory studies, ordering and review of radiographic studies, pulse oximetry and re-evaluation of patient's condition.  ____________________________________________   INITIAL IMPRESSION / ASSESSMENT AND PLAN / ED COURSE  Pertinent labs & imaging results that  were available during my care of the patient were reviewed by me and considered in my medical decision making (see chart for details).  The patient is essentially unresponsive.  He is sonorous and morbidly obese.  While stabilizing him, we checked a urine drug screen which is negative for narcotics, so there is no point in trying naloxone.  After his initial resuscitation and workup was done, I reassessed him and he has no gag reflex.  His GCS is 64 slightly withdrawing to pain.    ____________________________________________  FINAL CLINICAL IMPRESSION(S) / ED DIAGNOSES  Final diagnoses:  Acute respiratory  failure, unspecified whether with hypoxia or hypercapnia  Overdose, undetermined intent, initial encounter      NEW MEDICATIONS STARTED DURING THIS VISIT:  New Prescriptions   No medications on file     Loleta Rose, MD 11/13/14 [phone]

## 2014-11-13 ENCOUNTER — Emergency Department: Payer: Federal, State, Local not specified - PPO

## 2014-11-13 DIAGNOSIS — A419 Sepsis, unspecified organism: Secondary | ICD-10-CM | POA: Diagnosis not present

## 2014-11-13 DIAGNOSIS — Z6841 Body Mass Index (BMI) 40.0 and over, adult: Secondary | ICD-10-CM | POA: Diagnosis not present

## 2014-11-13 DIAGNOSIS — F319 Bipolar disorder, unspecified: Secondary | ICD-10-CM | POA: Diagnosis not present

## 2014-11-13 DIAGNOSIS — T17890A Other foreign object in other parts of respiratory tract causing asphyxiation, initial encounter: Secondary | ICD-10-CM | POA: Diagnosis present

## 2014-11-13 DIAGNOSIS — G9340 Encephalopathy, unspecified: Secondary | ICD-10-CM

## 2014-11-13 DIAGNOSIS — G92 Toxic encephalopathy: Secondary | ICD-10-CM | POA: Diagnosis present

## 2014-11-13 DIAGNOSIS — E870 Hyperosmolality and hypernatremia: Secondary | ICD-10-CM | POA: Diagnosis present

## 2014-11-13 DIAGNOSIS — J80 Acute respiratory distress syndrome: Secondary | ICD-10-CM | POA: Diagnosis not present

## 2014-11-13 DIAGNOSIS — R401 Stupor: Secondary | ICD-10-CM | POA: Diagnosis not present

## 2014-11-13 DIAGNOSIS — R451 Restlessness and agitation: Secondary | ICD-10-CM | POA: Diagnosis present

## 2014-11-13 DIAGNOSIS — J690 Pneumonitis due to inhalation of food and vomit: Secondary | ICD-10-CM | POA: Diagnosis not present

## 2014-11-13 DIAGNOSIS — R6520 Severe sepsis without septic shock: Secondary | ICD-10-CM | POA: Diagnosis present

## 2014-11-13 DIAGNOSIS — F4310 Post-traumatic stress disorder, unspecified: Secondary | ICD-10-CM | POA: Diagnosis present

## 2014-11-13 DIAGNOSIS — J15211 Pneumonia due to Methicillin susceptible Staphylococcus aureus: Secondary | ICD-10-CM | POA: Diagnosis present

## 2014-11-13 DIAGNOSIS — F1210 Cannabis abuse, uncomplicated: Secondary | ICD-10-CM | POA: Diagnosis present

## 2014-11-13 DIAGNOSIS — D6489 Other specified anemias: Secondary | ICD-10-CM | POA: Diagnosis present

## 2014-11-13 DIAGNOSIS — E876 Hypokalemia: Secondary | ICD-10-CM | POA: Diagnosis not present

## 2014-11-13 DIAGNOSIS — R001 Bradycardia, unspecified: Secondary | ICD-10-CM | POA: Diagnosis present

## 2014-11-13 DIAGNOSIS — R410 Disorientation, unspecified: Secondary | ICD-10-CM | POA: Diagnosis not present

## 2014-11-13 DIAGNOSIS — E8770 Fluid overload, unspecified: Secondary | ICD-10-CM | POA: Diagnosis present

## 2014-11-13 DIAGNOSIS — I959 Hypotension, unspecified: Secondary | ICD-10-CM | POA: Diagnosis not present

## 2014-11-13 DIAGNOSIS — J9602 Acute respiratory failure with hypercapnia: Secondary | ICD-10-CM | POA: Diagnosis present

## 2014-11-13 DIAGNOSIS — J9601 Acute respiratory failure with hypoxia: Secondary | ICD-10-CM | POA: Diagnosis not present

## 2014-11-13 DIAGNOSIS — J9600 Acute respiratory failure, unspecified whether with hypoxia or hypercapnia: Secondary | ICD-10-CM | POA: Diagnosis not present

## 2014-11-13 DIAGNOSIS — E039 Hypothyroidism, unspecified: Secondary | ICD-10-CM | POA: Diagnosis present

## 2014-11-13 DIAGNOSIS — T424X2A Poisoning by benzodiazepines, intentional self-harm, initial encounter: Secondary | ICD-10-CM | POA: Diagnosis present

## 2014-11-13 DIAGNOSIS — R031 Nonspecific low blood-pressure reading: Secondary | ICD-10-CM | POA: Diagnosis present

## 2014-11-13 DIAGNOSIS — T50904A Poisoning by unspecified drugs, medicaments and biological substances, undetermined, initial encounter: Secondary | ICD-10-CM | POA: Diagnosis not present

## 2014-11-13 DIAGNOSIS — R40243 Glasgow coma scale score 3-8: Secondary | ICD-10-CM | POA: Diagnosis present

## 2014-11-13 DIAGNOSIS — K5900 Constipation, unspecified: Secondary | ICD-10-CM | POA: Diagnosis present

## 2014-11-13 DIAGNOSIS — R1319 Other dysphagia: Secondary | ICD-10-CM | POA: Diagnosis present

## 2014-11-13 DIAGNOSIS — F840 Autistic disorder: Secondary | ICD-10-CM | POA: Diagnosis present

## 2014-11-13 DIAGNOSIS — Y92003 Bedroom of unspecified non-institutional (private) residence as the place of occurrence of the external cause: Secondary | ICD-10-CM | POA: Diagnosis not present

## 2014-11-13 DIAGNOSIS — F17210 Nicotine dependence, cigarettes, uncomplicated: Secondary | ICD-10-CM | POA: Diagnosis present

## 2014-11-13 DIAGNOSIS — R197 Diarrhea, unspecified: Secondary | ICD-10-CM | POA: Diagnosis present

## 2014-11-13 HISTORY — DX: Acute respiratory failure with hypoxia: J96.01

## 2014-11-13 LAB — URINALYSIS COMPLETE WITH MICROSCOPIC (ARMC ONLY)
Bacteria, UA: NONE SEEN
Bilirubin Urine: NEGATIVE
Glucose, UA: NEGATIVE mg/dL
Hgb urine dipstick: NEGATIVE
Ketones, ur: NEGATIVE mg/dL
Leukocytes, UA: NEGATIVE
Nitrite: NEGATIVE
Protein, ur: NEGATIVE mg/dL
RBC / HPF: NONE SEEN RBC/hpf (ref 0–5)
Specific Gravity, Urine: 1.017 (ref 1.005–1.030)
pH: 6 (ref 5.0–8.0)

## 2014-11-13 LAB — BLOOD GAS, ARTERIAL
ACID-BASE DEFICIT: 2.8 mmol/L — AB (ref 0.0–2.0)
Allens test (pass/fail): POSITIVE — AB
BICARBONATE: 25.1 meq/L (ref 21.0–28.0)
FIO2: 0.5
O2 Saturation: 90.1 %
PEEP/CPAP: 5 cmH2O
PH ART: 7.26 — AB (ref 7.350–7.450)
Patient temperature: 37
RATE: 16 resp/min
VT: 550 mL
pCO2 arterial: 56 mmHg — ABNORMAL HIGH (ref 32.0–48.0)
pO2, Arterial: 68 mmHg — ABNORMAL LOW (ref 83.0–108.0)

## 2014-11-13 LAB — URINE DRUG SCREEN, QUALITATIVE (ARMC ONLY)
Amphetamines, Ur Screen: NOT DETECTED
BARBITURATES, UR SCREEN: NOT DETECTED
BENZODIAZEPINE, UR SCRN: POSITIVE — AB
CANNABINOID 50 NG, UR ~~LOC~~: POSITIVE — AB
COCAINE METABOLITE, UR ~~LOC~~: NOT DETECTED
MDMA (Ecstasy)Ur Screen: NOT DETECTED
METHADONE SCREEN, URINE: NOT DETECTED
OPIATE, UR SCREEN: NOT DETECTED
PHENCYCLIDINE (PCP) UR S: NOT DETECTED
Tricyclic, Ur Screen: NOT DETECTED

## 2014-11-13 LAB — COMPREHENSIVE METABOLIC PANEL
ALK PHOS: 51 U/L (ref 38–126)
ALT: 28 U/L (ref 17–63)
ANION GAP: 6 (ref 5–15)
AST: 21 U/L (ref 15–41)
Albumin: 3.8 g/dL (ref 3.5–5.0)
BILIRUBIN TOTAL: 0.7 mg/dL (ref 0.3–1.2)
BUN: 15 mg/dL (ref 6–20)
CALCIUM: 8.5 mg/dL — AB (ref 8.9–10.3)
CO2: 27 mmol/L (ref 22–32)
Chloride: 108 mmol/L (ref 101–111)
Creatinine, Ser: 0.82 mg/dL (ref 0.61–1.24)
GFR calc non Af Amer: >60 mL/min (ref >60)
Glucose, Bld: 93 mg/dL (ref 65–99)
Potassium: 3.8 mmol/L (ref 3.5–5.1)
Sodium: 141 mmol/L (ref 135–145)
TOTAL PROTEIN: 6.2 g/dL — AB (ref 6.5–8.1)

## 2014-11-13 LAB — LACTIC ACID, PLASMA: LACTIC ACID, VENOUS: 0.7 mmol/L (ref 0.5–2.0)

## 2014-11-13 LAB — ETHANOL
Alcohol, Ethyl (B): <5 mg/dL (ref <5)
Alcohol, Ethyl (B): <5 mg/dL (ref <5)

## 2014-11-13 LAB — ACETAMINOPHEN LEVEL

## 2014-11-13 LAB — SALICYLATE LEVEL

## 2014-11-13 LAB — TRIGLYCERIDES: Triglycerides: 36 mg/dL (ref <150)

## 2014-11-13 LAB — HEMOGLOBIN A1C: Hgb A1c MFr Bld: 5.4 % (ref 4.0–6.0)

## 2014-11-13 LAB — MRSA PCR SCREENING: MRSA by PCR: NEGATIVE

## 2014-11-13 LAB — MAGNESIUM: MAGNESIUM: 2.2 mg/dL (ref 1.7–2.4)

## 2014-11-13 LAB — TROPONIN I

## 2014-11-13 MED ORDER — SUCCINYLCHOLINE CHLORIDE 20 MG/ML IJ SOLN
INTRAMUSCULAR | Status: AC | PRN
  Administered 2014-11-13: 140 mg via INTRAVENOUS

## 2014-11-13 MED ORDER — CITALOPRAM HYDROBROMIDE 20 MG PO TABS
40.0000 mg | ORAL_TABLET | Freq: Every day | ORAL | Status: DC
  Administered 2014-11-13: 40 mg via ORAL
  Filled 2014-11-13 (×2): qty 2

## 2014-11-13 MED ORDER — HEPARIN SODIUM (PORCINE) 5000 UNIT/ML IJ SOLN
5000.0000 [IU] | Freq: Three times a day (TID) | INTRAMUSCULAR | Status: DC
  Administered 2014-11-13 – 2014-11-22 (×27): 5000 [IU] via SUBCUTANEOUS
  Filled 2014-11-13 (×27): qty 1

## 2014-11-13 MED ORDER — PROPOFOL 1000 MG/100ML IV EMUL
5.0000 ug/kg/min | Freq: Once | INTRAVENOUS | Status: AC
  Administered 2014-11-13: 5 ug/kg/min via INTRAVENOUS
  Filled 2014-11-13: qty 100

## 2014-11-13 MED ORDER — VECURONIUM BROMIDE 10 MG IV SOLR
10.0000 mg | Freq: Once | INTRAVENOUS | Status: AC
  Administered 2014-11-13: 10 mg via INTRAVENOUS

## 2014-11-13 MED ORDER — VECURONIUM BROMIDE 10 MG IV SOLR
10.0000 mg | INTRAVENOUS | Status: AC
  Administered 2014-11-13: 10 mg via INTRAVENOUS

## 2014-11-13 MED ORDER — VECURONIUM BROMIDE 10 MG IV SOLR
10.0000 mg | INTRAVENOUS | Status: DC | PRN
  Administered 2014-11-13: 10 mg via INTRAVENOUS

## 2014-11-13 MED ORDER — FENTANYL 2500MCG IN NS 250ML (10MCG/ML) PREMIX INFUSION
10.0000 ug/h | INTRAVENOUS | Status: DC
  Administered 2014-11-13: 25 ug/h via INTRAVENOUS
  Administered 2014-11-13: 10 ug/h via INTRAVENOUS
  Filled 2014-11-13 (×2): qty 250

## 2014-11-13 MED ORDER — VECURONIUM BROMIDE 10 MG IV SOLR
INTRAVENOUS | Status: AC
  Filled 2014-11-13: qty 10

## 2014-11-13 MED ORDER — LEVOTHYROXINE SODIUM 25 MCG PO TABS
25.0000 ug | ORAL_TABLET | Freq: Every day | ORAL | Status: DC
  Administered 2014-11-13: 25 ug
  Filled 2014-11-13 (×2): qty 1

## 2014-11-13 MED ORDER — SODIUM CHLORIDE 0.9 % IV BOLUS (SEPSIS)
1000.0000 mL | INTRAVENOUS | Status: AC
  Administered 2014-11-13: 1000 mL via INTRAVENOUS

## 2014-11-13 MED ORDER — PANTOPRAZOLE SODIUM 40 MG IV SOLR
40.0000 mg | INTRAVENOUS | Status: DC
  Administered 2014-11-13 – 2014-11-18 (×6): 40 mg via INTRAVENOUS
  Filled 2014-11-13 (×6): qty 40

## 2014-11-13 MED ORDER — CHLORHEXIDINE GLUCONATE 0.12% ORAL RINSE (MEDLINE KIT)
15.0000 mL | Freq: Two times a day (BID) | OROMUCOSAL | Status: DC
  Administered 2014-11-13 – 2014-11-16 (×6): 15 mL via OROMUCOSAL
  Filled 2014-11-13 (×9): qty 15

## 2014-11-13 MED ORDER — ATROPINE SULFATE 1 MG/ML IJ SOLN
INTRAMUSCULAR | Status: AC | PRN
  Administered 2014-11-13: 1 mg via INTRAVENOUS

## 2014-11-13 MED ORDER — ANTISEPTIC ORAL RINSE SOLUTION (CORINZ)
7.0000 mL | Freq: Four times a day (QID) | OROMUCOSAL | Status: DC
  Administered 2014-11-13 – 2014-11-16 (×12): 7 mL via OROMUCOSAL
  Filled 2014-11-13 (×17): qty 7

## 2014-11-13 MED ORDER — ATROPINE SULFATE 0.1 MG/ML IJ SOLN
INTRAMUSCULAR | Status: AC
  Administered 2014-11-13: 1 mg
  Filled 2014-11-13: qty 10

## 2014-11-13 MED ORDER — ETOMIDATE 2 MG/ML IV SOLN: 20.0000 mg | Freq: Once | INTRAVENOUS | Status: AC

## 2014-11-13 MED ORDER — ALPRAZOLAM 1 MG PO TABS
1.0000 mg | ORAL_TABLET | Freq: Three times a day (TID) | ORAL | Status: DC
  Administered 2014-11-13 (×2): 1 mg via ORAL
  Filled 2014-11-13 (×3): qty 1

## 2014-11-13 MED ORDER — MORPHINE SULFATE (PF) 2 MG/ML IV SOLN: 2.0000 mg | INTRAVENOUS | Status: DC | PRN

## 2014-11-13 MED ORDER — PROPOFOL 1000 MG/100ML IV EMUL
5.0000 ug/kg/min | INTRAVENOUS | Status: DC
  Administered 2014-11-13: 15 ug/kg/min via INTRAVENOUS
  Administered 2014-11-13: 20 ug/kg/min via INTRAVENOUS
  Administered 2014-11-13: 25 ug/kg/min via INTRAVENOUS
  Administered 2014-11-14: 15.046 ug/kg/min via INTRAVENOUS
  Filled 2014-11-13 (×4): qty 100

## 2014-11-13 MED ORDER — INFLUENZA VAC SPLIT QUAD 0.5 ML IM SUSY
0.5000 mL | PREFILLED_SYRINGE | INTRAMUSCULAR | Status: DC
  Filled 2014-11-13 (×2): qty 0.5

## 2014-11-13 MED ORDER — SODIUM CHLORIDE 0.9 % IV SOLN
INTRAVENOUS | Status: DC
  Administered 2014-11-13: 14:00:00 via INTRAVENOUS
  Administered 2014-11-13: 125 mL/h via INTRAVENOUS
  Administered 2014-11-13 – 2014-11-14 (×4): via INTRAVENOUS

## 2014-11-13 MED ORDER — ACETAMINOPHEN 325 MG PO TABS: 650.0000 mg | ORAL_TABLET | Freq: Four times a day (QID) | ORAL | Status: DC | PRN

## 2014-11-13 MED ORDER — LAMOTRIGINE 25 MG PO TABS
150.0000 mg | ORAL_TABLET | Freq: Two times a day (BID) | ORAL | Status: DC
  Administered 2014-11-13: 150 mg via ORAL
  Filled 2014-11-13 (×2): qty 6

## 2014-11-13 MED ORDER — SUCCINYLCHOLINE CHLORIDE 20 MG/ML IJ SOLN: 140.0000 mg | Freq: Once | INTRAMUSCULAR | Status: AC

## 2014-11-13 MED ORDER — ATROPINE SULFATE 0.1 MG/ML IJ SOLN
INTRAMUSCULAR | Status: AC
  Filled 2014-11-13: qty 10

## 2014-11-13 MED ORDER — ETOMIDATE 2 MG/ML IV SOLN
INTRAVENOUS | Status: AC | PRN
  Administered 2014-11-13: 20 mg via INTRAVENOUS

## 2014-11-13 MED ORDER — ACETAMINOPHEN 650 MG RE SUPP: 650.0000 mg | Freq: Four times a day (QID) | RECTAL | Status: DC | PRN

## 2014-11-13 NOTE — Progress Notes (Signed)
eLink Physician-Brief Progress Note Patient Name: [name] DOB: 24-Dec-1990 MRN: [id number]   Date of Service  11/13/2014  HPI/Events of Note  26 M with h/o of hypothyroid, high- functioning autism and MO presenting to ED with obtundation/AMS and concern for drug OD.  UDS positive for Cannabis and benzos.  Sinus brady on EKB but HD stable.  ABG shows resp acidosis.  Patient intubated in ED due to [facility] and AMS.  Admitted to hospitalist service.   Current sedation of propofol/fentanyl.  eICU Interventions  Plan of care per primary team PPI added as stress ulcer propy - best practice Continue to monitor via [facility]     Intervention Category Intermediate Interventions: Best-practice therapies (e.g. DVT, beta blocker, etc.) Evaluation Type: New Patient Evaluation  Markeia Harkless 11/13/2014, 4:01 AM

## 2014-11-13 NOTE — ED Notes (Signed)
Pt's father, Saxon Barich, can be reached at [phone]

## 2014-11-13 NOTE — H&P (Addendum)
[name] is an 24 y.o. male.   Chief Complaint: Loss of consciousness HPI: The patient presents emergency department via EMS after being found face down in his room. His parents report hearing a fall and then finding the patient as described above. In the emergency department the patient was initially sonorous but responsive to pain with coordinated motor response. However, initial physical examination is documented as having no gag reflex. Despite this finding the patient was initially handling his secretions appropriately. Respiratory rate was also normal albeit somewhat shallow. The patient was sent to CT to evaluate for intracranial abnormalities and was observed by this examiner to cooperate with radiology tech and partially rolled to the side as would a child who does not want to be disturbed while sleeping. CT head was normal. However, after return from radiology the patient reportedly had copious secretions and was more deeply obtunded. He also reportedly displayed some decorticate posturing which prompted the emergency department physician to intubate and call the hospitalist service for admission.  Past Medical History  Diagnosis Date  . Thyroid disease     History reviewed. No pertinent past surgical history.  No family history on file. unable to obtain as the patient's family is not at bedside Social History:  reports that he has been smoking Cigarettes.  He has been smoking about 1.00 pack per day. He does not have any smokeless tobacco history on file. He reports that he does not drink alcohol. His drug history is not on file.  Allergies:  Allergies  Allergen Reactions  . Sulfa Antibiotics     Medications Prior to Admission  Medication Sig Dispense Refill  . citalopram (CELEXA) 40 MG tablet Take 40 mg by mouth daily.    Marland Kitchen esomeprazole (NEXIUM) 40 MG capsule Take 40 mg by mouth daily at 12 noon.    Marland Kitchen ketorolac (TORADOL) 10 MG tablet Take 1 tablet (10 mg total) by mouth  every 8 (eight) hours as needed. 20 tablet 0  . lamoTRIgine (LAMICTAL) 150 MG tablet Take 150 mg by mouth 2 (two) times daily.    Marland Kitchen levothyroxine (SYNTHROID, LEVOTHROID) 25 MCG tablet Take 25 mcg by mouth daily before breakfast.    . naltrexone (DEPADE) 50 MG tablet Take 25 mg by mouth daily.    Marland Kitchen oxyCODONE-acetaminophen (PERCOCET) 10-325 MG per tablet Take 1 tablet by mouth every 6 (six) hours as needed for pain. 20 tablet 0    Results for orders placed or performed during the hospital encounter of 11/12/14 (from the past 48 hour(s))  Comprehensive metabolic panel     Status: Abnormal   Collection Time: 11/12/14 11:26 PM  Result Value Ref Range   Sodium 141 135 - 145 mmol/L   Potassium 3.8 3.5 - 5.1 mmol/L   Chloride 108 101 - 111 mmol/L   CO2 27 22 - 32 mmol/L   Glucose, Bld 93 65 - 99 mg/dL   BUN 15 6 - 20 mg/dL   Creatinine, Ser 0.82 0.61 - 1.24 mg/dL   Calcium 8.5 (L) 8.9 - 10.3 mg/dL   Total Protein 6.2 (L) 6.5 - 8.1 g/dL   Albumin 3.8 3.5 - 5.0 g/dL   AST 21 15 - 41 U/L   ALT 28 17 - 63 U/L   Alkaline Phosphatase 51 38 - 126 U/L   Total Bilirubin 0.7 0.3 - 1.2 mg/dL   GFR calc non Af Amer >60 >60 mL/min   GFR calc Af Amer >60 >60 mL/min    Comment: (  NOTE) The eGFR has been calculated using the CKD EPI equation. This calculation has not been validated in all clinical situations. eGFR's persistently <60 mL/min signify possible Chronic Kidney Disease.    Anion gap 6 5 - 15  Ethanol (ETOH)     Status: None   Collection Time: 11/12/14 11:26 PM  Result Value Ref Range   Alcohol, Ethyl (B) <5 <5 mg/dL    Comment:        LOWEST DETECTABLE LIMIT FOR SERUM ALCOHOL IS 5 mg/dL FOR MEDICAL PURPOSES ONLY   Salicylate level     Status: None   Collection Time: 11/12/14 11:26 PM  Result Value Ref Range   Salicylate Lvl <5.0 2.8 - 30.0 mg/dL  Acetaminophen level     Status: Abnormal   Collection Time: 11/12/14 11:26 PM  Result Value Ref Range   Acetaminophen (Tylenol), Serum  <10 (L) 10 - 30 ug/mL    Comment:        THERAPEUTIC CONCENTRATIONS VARY SIGNIFICANTLY. A RANGE OF 10-30 ug/mL MAY BE AN EFFECTIVE CONCENTRATION FOR MANY PATIENTS. HOWEVER, SOME ARE BEST TREATED AT CONCENTRATIONS OUTSIDE THIS RANGE. ACETAMINOPHEN CONCENTRATIONS >150 ug/mL AT 4 HOURS AFTER INGESTION AND >50 ug/mL AT 12 HOURS AFTER INGESTION ARE OFTEN ASSOCIATED WITH TOXIC REACTIONS.   CBC     Status: None   Collection Time: 11/12/14 11:26 PM  Result Value Ref Range   WBC 10.6 3.8 - 10.6 K/uL   RBC 4.98 4.40 - 5.90 MIL/uL   Hemoglobin 15.0 13.0 - 18.0 g/dL   HCT 44.6 40.0 - 52.0 %   MCV 89.4 80.0 - 100.0 fL   MCH 30.1 26.0 - 34.0 pg   MCHC 33.6 32.0 - 36.0 g/dL   RDW 14.1 11.5 - 14.5 %   Platelets 167 150 - 440 K/uL  Magnesium     Status: None   Collection Time: 11/12/14 11:26 PM  Result Value Ref Range   Magnesium 2.2 1.7 - 2.4 mg/dL  Troponin I     Status: None   Collection Time: 11/12/14 11:26 PM  Result Value Ref Range   Troponin I <0.03 <0.031 ng/mL    Comment:        NO INDICATION OF MYOCARDIAL INJURY.   Urine Drug Screen, Qualitative ([facility] only)     Status: Abnormal   Collection Time: 11/12/14 11:43 PM  Result Value Ref Range   Tricyclic, Ur Screen NONE DETECTED NONE DETECTED   Amphetamines, Ur Screen NONE DETECTED NONE DETECTED   MDMA (Ecstasy)Ur Screen NONE DETECTED NONE DETECTED   Cocaine Metabolite,Ur [place] NONE DETECTED NONE DETECTED   Opiate, Ur Screen NONE DETECTED NONE DETECTED   Phencyclidine (PCP) Ur S NONE DETECTED NONE DETECTED   Cannabinoid 50 Ng, Ur [place] POSITIVE (A) NONE DETECTED   Barbiturates, Ur Screen NONE DETECTED NONE DETECTED   Benzodiazepine, Ur Scrn POSITIVE (A) NONE DETECTED   Methadone Scn, Ur NONE DETECTED NONE DETECTED    Comment: (NOTE) 932  Tricyclics, urine               Cutoff 1000 ng/mL 200  Amphetamines, urine             Cutoff 1000 ng/mL 300  MDMA (Ecstasy), urine           Cutoff 500 ng/mL 400  Cocaine Metabolite, urine        Cutoff 300 ng/mL 500  Opiate, urine  Cutoff 300 ng/mL 600  Phencyclidine (PCP), urine      Cutoff 25 ng/mL 700  Cannabinoid, urine              Cutoff 50 ng/mL 800  Barbiturates, urine             Cutoff 200 ng/mL 900  Benzodiazepine, urine           Cutoff 200 ng/mL 1000 Methadone, urine                Cutoff 300 ng/mL 1100 1200 The urine drug screen provides only a preliminary, unconfirmed 1300 analytical test result and should not be used for non-medical 1400 purposes. Clinical consideration and professional judgment should 1500 be applied to any positive drug screen result due to possible 1600 interfering substances. A more specific alternate chemical method 1700 must be used in order to obtain a confirmed analytical result.  1800 Gas chromato graphy / mass spectrometry (GC/MS) is the preferred 1900 confirmatory method.   Lactic acid, plasma     Status: None   Collection Time: 11/12/14 11:43 PM  Result Value Ref Range   Lactic Acid, Venous 0.7 0.5 - 2.0 mmol/L  Blood gas, arterial     Status: Abnormal (Preliminary result)   Collection Time: 11/13/14  2:26 AM  Result Value Ref Range   FIO2 0.50    Delivery systems VENTILATOR    Mode ASSIST CONTROL    VT 550 mL   LHR 16 resp/min   Peep/cpap 5.0 cm H20   Expiratory PAP PENDING    pH, Arterial 7.26 (L) 7.350 - 7.450   pCO2 arterial 56 (H) 32.0 - 48.0 mmHg   pO2, Arterial 68 (L) 83.0 - 108.0 mmHg   Bicarbonate 25.1 21.0 - 28.0 mEq/L   Acid-base deficit 2.8 (H) 0.0 - 2.0 mmol/L   O2 Saturation 90.1 %   Patient temperature 37.0    Collection site RIGHT RADIAL    Sample type ARTERIAL DRAW    Allens test (pass/fail) POSITIVE (A) PASS   Ct Head Wo Contrast  11/13/2014   CLINICAL DATA:  History drug overdose. Unresponsive. Found down on floor. Difficult to arouse.  EXAM: CT HEAD WITHOUT CONTRAST  TECHNIQUE: Contiguous axial images were obtained from the base of the skull through the vertex without intravenous  contrast.  COMPARISON:  04/19/2013  FINDINGS: Ventricles and sulci appear symmetrical. No mass effect or midline shift. No abnormal extra-axial fluid collections. Gray-white matter junctions are distinct. Basal cisterns are not effaced. No evidence of acute intracranial hemorrhage. No depressed skull fractures. Visualized paranasal sinuses and mastoid air cells are not opacified.  IMPRESSION: No acute intracranial abnormalities.   Electronically Signed   By: Lucienne Capers M.D.   On: 11/13/2014 00:50   Dg Chest Portable 1 View  11/13/2014   CLINICAL DATA:  Post intubation  EXAM: PORTABLE CHEST - 1 VIEW  COMPARISON:  06/30/2009  FINDINGS: Interval placement of an endotracheal tube with tip measuring 3.2 cm above the carina. Shallow inspiration. Heart size and pulmonary vascularity are normal for technique. No focal airspace disease or consolidation in the lungs. No blunting of costophrenic angles. No pneumothorax.  IMPRESSION: Endotracheal tube tip measures 3.2 cm above the carina. Shallow inspiration. No evidence of active pulmonary disease.   Electronically Signed   By: Lucienne Capers M.D.   On: 11/13/2014 02:19    Review of Systems  Unable to perform ROS: intubated    Blood pressure 116/81, pulse 60, temperature 97.6  F (36.4 C), temperature source Axillary, resp. rate 16, height '5\' 8"'  (1.727 m), weight 144 kg (317 lb 7.4 oz), SpO2 100 %. Physical Exam  Nursing note and vitals reviewed. Constitutional: He appears well-developed and well-nourished. He appears lethargic. He is intubated.  HENT:  Head: Normocephalic and atraumatic.  Mouth/Throat: Oropharynx is clear and moist.  Eyes: Conjunctivae are normal. No scleral icterus. Right pupil is not reactive. Left pupil is not reactive. Pupils are equal.  Does not track  Neck: Neck supple. No JVD present. No tracheal deviation present. No thyromegaly present.  Cardiovascular: Normal heart sounds and intact distal pulses.  Exam reveals no gallop  and no friction rub.   No murmur heard. Intermittent bradycardia  Respiratory: Breath sounds normal. He is intubated.  GI: Soft. Bowel sounds are normal. He exhibits no distension. There is no tenderness.  Genitourinary: Penis normal.  Musculoskeletal: Normal range of motion. He exhibits no edema.  Patient not following commands but exhibits normal range of motion in all 4 extremities when agitated  Lymphadenopathy:    He has no cervical adenopathy.  Neurological: He has normal strength. He appears lethargic. No cranial nerve deficit or sensory deficit. GCS eye subscore is 2. GCS verbal subscore is 1. GCS motor subscore is 6.  Skin: Skin is warm and dry. No rash noted. No erythema.  Psychiatric:  Patient is nonverbal and thus cannot dissipate and psychological evaluation     Assessment/Plan This is a 24 year old Caucasian male admitted for altered mental status and acute respiratory failure. 1. Altered mental status: Urine tox screen positive for benzodiazepines and cannabis. It is unclear if the patient is a chronic and is long-term benzodiazepine user but wisely, he was not given flumazenil to reverse potential overdose as he may have a seizure disorder based on his home medication of Lamictal. However, it is also possible this medication is being used for mood stabilization. Nonetheless, I will defer flumazenil administration at this time as the patient is already intubated. His home medication list also includes daily naltrexone. It is unclear why the patient has this medication. He is a known marijuana smoker however there is no reported history of opiate abuse. Different diagnosis for altered mental status also includes overdose of Lamictal. 2. Acute respiratory failure: Initial ABG shows mild hypercarbia. Following intubation mechanical ventilation rate increased from 16 to 20. Oxygenation tidal volumes appropriate for ideal body weight. Lungs are clear. Prior to pharmacologic paralyzation  the patient was breathing over the ventilator. Wean ventilator support as mental status improves.  No indication of pneumonia.  No signs or symptoms of sepsis. 3. Obtundation: Neurologic insult difficult to interpret as the patient's level of alertness appears to have waxed and waned. There is no report by EMS of respiratory support en route. The patient maintained oxygen saturations and appropriate respiratory rate for the majority of the time in the emergency department. He was intubated mostly for airway protection. Also the report of posturing is contrary to what we observe at this time when the patient was not pharmacologically paralyzed. Furthermore, original physical exam is positive for absence of gag reflex which we did not see when trying to reposition the tube (advanced 2 cm) prior to pharmacologic paralysis. At this time the patient is sedated and paralyzed with propofol, fentanyl, and vecuronium when necessary as my goal is to steadily lessen sedation and pharmacologic paralysis in order to obtain a reliable neurologic exam by morning. Ideally, I would like to place patient on an infusion of  Precedex but he originally had bradycardia of 50 bpm that decreased to 20 bpm following intubation which prompted the emergency staff to give atropine. At that time, only propofol was used to maintain sedation, however repositioning of the endotracheal tube required more propofol which dropped the patient's pressure. Thus, a combination of propofol, fentanyl, and as needed vecuronium bolus will be used for sedation. 4. Bradycardia: Stable; continue to monitor telemetry 5. Hypothyroidism: Check TSH. Continue Synthroid 6. Morbid Obesity: BMI is 47.7; encouraged healthy diet and exercise 7. Fluid/electrolytes/nutrition: Normal saline at maintenance rate 8. DVT prophylaxis: heparin 9. GI prophylaxis: ppi if intubated longer than 24 hours The patient is a full code. Time spent on admission orders and critical  patient care approximately 45 minutes   Harrie Foreman 11/13/2014, 3:45 AM

## 2014-11-13 NOTE — ED Notes (Signed)
This RN, RT, Luis, RN and [employer] remains at bedside.

## 2014-11-13 NOTE — Consult Note (Signed)
[facility] Consultation      Name: [name] MRN: [id number] DOB: 14-Aug-1990    ADMISSION DATE:  11/12/2014   CHIEF COMPLAINT:   Acute mental status changes, resp failure   HISTORY OF PRESENT ILLNESS  24 yo white male admitted to ICU for acute mental status changes, The patient presents emergency department via EMS after being found face down in his room. His parents report hearing a fall and then finding the patient as described above.   In the emergency department the patient was initially sonorous but responsive to pain with coordinated motor response. initial physical examination is documented as having no gag reflex. Despite this finding the patient was initially handling his secretions appropriately.    CT head was normal.  after return from radiology the patient reportedly had copious secretions and was more deeply obtunded. He also reportedly displayed some decorticate posturing which prompted the emergency department physician to intubate.     SIGNIFICANT EVENTS   Intubated 9/6 Suicide attempt?   PAST MEDICAL HISTORY    :  Past Medical History  Diagnosis Date  . Thyroid disease    History reviewed. No pertinent past surgical history. Prior to Admission medications   Medication Sig Start Date End Date Taking? Authorizing Provider  citalopram (CELEXA) 40 MG tablet Take 40 mg by mouth daily.    Historical Provider, MD  esomeprazole (NEXIUM) 40 MG capsule Take 40 mg by mouth daily at 12 noon.    Historical Provider, MD  ketorolac (TORADOL) 10 MG tablet Take 1 tablet (10 mg total) by mouth every 8 (eight) hours as needed. 09/24/14   Gregor Hams, MD  lamoTRIgine (LAMICTAL) 150 MG tablet Take 150 mg by mouth 2 (two) times daily.    Historical Provider, MD  levothyroxine (SYNTHROID, LEVOTHROID) 25 MCG tablet Take 25 mcg by mouth daily before breakfast.    Historical Provider, MD  naltrexone (DEPADE) 50 MG tablet Take 25 mg by mouth daily.     Historical Provider, MD  oxyCODONE-acetaminophen (PERCOCET) 10-325 MG per tablet Take 1 tablet by mouth every 6 (six) hours as needed for pain. 10/01/14 10/01/15  Gregor Hams, MD   Allergies  Allergen Reactions  . Sulfa Antibiotics      FAMILY HISTORY   No family history on file.    SOCIAL HISTORY    reports that he has been smoking Cigarettes.  He has been smoking about 1.00 pack per day. He does not have any smokeless tobacco history on file. He reports that he does not drink alcohol. His drug history is not on file.  Review of Systems  Unable to perform ROS: critical illness      VITAL SIGNS    Temp:  [97.6 F (36.4 C)-97.9 F (36.6 C)] 97.9 F (36.6 C) (09/07 0800) Pulse Rate:  [44-104] 50 (09/07 0700) Resp:  [16-23] 20 (09/07 0700) BP: (75-154)/(27-124) 104/67 mmHg (09/07 0700) SpO2:  [94 %-100 %] 99 % (09/07 0700) FiO2 (%):  [50 %] 50 % (09/07 0340) Weight:  [313 lb 0.9 oz (142 kg)-317 lb 7.4 oz (144 kg)] 313 lb 0.9 oz (142 kg) (09/07 0400) HEMODYNAMICS:   VENTILATOR SETTINGS: Vent Mode:  [-] PRVC FiO2 (%):  [50 %] 50 % Set Rate:  [16 bmp-20 bmp] 20 bmp Vt Set:  [550 mL] 550 mL PEEP:  [5 cmH20] 5 cmH20 INTAKE / OUTPUT:  Intake/Output Summary (Last 24 hours) at 11/13/14 0852 Last data filed at 11/13/14 0800  Gross  per 24 hour  Intake 563.22 ml  Output    550 ml  Net  13.22 ml       PHYSICAL EXAM   Physical Exam  Constitutional: No distress.  HENT:  Head: Normocephalic and atraumatic.  Eyes: Pupils are equal, round, and reactive to light. No scleral icterus.  Neck: Normal range of motion. Neck supple.  Cardiovascular: Normal rate and regular rhythm.   No murmur heard. Pulmonary/Chest: No respiratory distress. He has no wheezes. He has rales.  resp distress  Abdominal: Soft. He exhibits no distension. There is no tenderness.  Musculoskeletal: He exhibits no edema.  Neurological: He displays normal reflexes. Coordination normal.  gcs<8T    Skin: Skin is warm. No rash noted. He is not diaphoretic.       LABS   LABS:  CBC  Recent Labs Lab 11/12/14 2326  WBC 10.6  HGB 15.0  HCT 44.6  PLT 167   Coag's No results for input(s): APTT, INR in the last 168 hours. BMET  Recent Labs Lab 11/12/14 2326  NA 141  K 3.8  CL 108  CO2 27  BUN 15  CREATININE 0.82  GLUCOSE 93   Electrolytes  Recent Labs Lab 11/12/14 2326  CALCIUM 8.5*  MG 2.2   Sepsis Markers  Recent Labs Lab 11/12/14 2343  LATICACIDVEN 0.7   ABG  Recent Labs Lab 11/13/14 0226  PHART 7.26*  PCO2ART 56*  PO2ART 68*   Liver Enzymes  Recent Labs Lab 11/12/14 2326  AST 21  ALT 28  ALKPHOS 51  BILITOT 0.7  ALBUMIN 3.8   Cardiac Enzymes  Recent Labs Lab 11/12/14 2326  TROPONINI <0.03   Glucose No results for input(s): GLUCAP in the last 168 hours.   Recent Results (from the past 240 hour(s))  MRSA PCR Screening     Status: None   Collection Time: 11/13/14  3:40 AM  Result Value Ref Range Status   MRSA by PCR NEGATIVE NEGATIVE Final    Comment:        The GeneXpert MRSA Assay (FDA approved for NASAL specimens only), is one component of a comprehensive MRSA colonization surveillance program. It is not intended to diagnose MRSA infection nor to guide or monitor treatment for MRSA infections.      Current facility-administered medications:  .  0.9 %  sodium chloride infusion, , Intravenous, Continuous, Harrie Foreman, MD, Last Rate: 125 mL/hr at 11/13/14 0700 .  acetaminophen (TYLENOL) tablet 650 mg, 650 mg, Oral, Q6H PRN **OR** acetaminophen (TYLENOL) suppository 650 mg, 650 mg, Rectal, Q6H PRN, Harrie Foreman, MD .  antiseptic oral rinse solution (CORINZ), 7 mL, Mouth Rinse, QID, Harrie Foreman, MD, 7 mL at 11/13/14 0400 .  chlorhexidine gluconate (PERIDEX) 0.12 % solution 15 mL, 15 mL, Mouth Rinse, BID, Harrie Foreman, MD, 15 mL at 11/13/14 0719 .  citalopram (CELEXA) tablet 40 mg, 40 mg, Oral,  Daily, Harrie Foreman, MD, 40 mg at 11/13/14 0824 .  fentaNYL 25101mg in NS 2568m(1053mml) infusion-PREMIX, 10 mcg/hr, Intravenous, Continuous, CorHinda KehrD, Last Rate: 10 mL/hr at 11/13/14 0828, 100 mcg/hr at 11/13/14 0828 .  heparin injection 5,000 Units, 5,000 Units, Subcutaneous, 3 times per day, MicHarrie ForemanD, 5,000 Units at 11/13/14 [id number] [START ON 11/14/2014] Influenza vac split quadrivalent PF (FLUARIX) injection 0.5 mL, 0.5 mL, Intramuscular, Tomorrow-1000, MicHarrie ForemanD .  levothyroxine (SYNTHROID, LEVOTHROID) tablet 25 mcg, 25 mcg, Per Tube, QAC breakfast, MicNorva Riffle  Marcille Blanco, MD, 25 mcg at 11/13/14 [id number] .  morphine 2 MG/ML injection 2 mg, 2 mg, Intravenous, Q4H PRN, Harrie Foreman, MD .  pantoprazole (PROTONIX) injection 40 mg, 40 mg, Intravenous, Q24H, Colbert Coyer, MD, 40 mg at 11/13/14 0501 .  propofol (DIPRIVAN) 1000 MG/100ML infusion, 5-80 mcg/kg/min, Intravenous, Titrated, Harrie Foreman, MD, Last Rate: 17.3 mL/hr at 11/13/14 0700, 20 mcg/kg/min at 11/13/14 0700 .  vecuronium (NORCURON) 10 MG injection, , , ,  .  vecuronium (NORCURON) injection 10 mg, 10 mg, Intravenous, Q1H PRN, Harrie Foreman, MD, 10 mg at 11/13/14 0719  IMAGING    Ct Head Wo Contrast  11/13/2014   CLINICAL DATA:  History drug overdose. Unresponsive. Found down on floor. Difficult to arouse.  EXAM: CT HEAD WITHOUT CONTRAST  TECHNIQUE: Contiguous axial images were obtained from the base of the skull through the vertex without intravenous contrast.  COMPARISON:  04/19/2013  FINDINGS: Ventricles and sulci appear symmetrical. No mass effect or midline shift. No abnormal extra-axial fluid collections. Gray-white matter junctions are distinct. Basal cisterns are not effaced. No evidence of acute intracranial hemorrhage. No depressed skull fractures. Visualized paranasal sinuses and mastoid air cells are not opacified.  IMPRESSION: No acute intracranial abnormalities.    Electronically Signed   By: Lucienne Capers M.D.   On: 11/13/2014 00:50   Dg Chest Portable 1 View  11/13/2014   CLINICAL DATA:  Post intubation  EXAM: PORTABLE CHEST - 1 VIEW  COMPARISON:  06/30/2009  FINDINGS: Interval placement of an endotracheal tube with tip measuring 3.2 cm above the carina. Shallow inspiration. Heart size and pulmonary vascularity are normal for technique. No focal airspace disease or consolidation in the lungs. No blunting of costophrenic angles. No pneumothorax.  IMPRESSION: Endotracheal tube tip measures 3.2 cm above the carina. Shallow inspiration. No evidence of active pulmonary disease.   Electronically Signed   By: Lucienne Capers M.D.   On: 11/13/2014 02:19      Indwelling Urinary Catheter continued, requirement due to   Reason to continue Indwelling Urinary Catheter for strict Intake/Output monitoring for hemodynamic instability         Ventilator continued, requirement due to, resp failure    Ventilator Sedation RASS 0 to -2   MAJOR EVENTS/TEST RESULTS: intubated 9/6 CT head and CXR no acute issues    INDWELLING DEVICES::  MICRO DATA: MRSA PCR >>negative Urine  Blood Resp   ANTIMICROBIALS:     ASSESSMENT/PLAN    24 yo obese white male admitted to [facility] for acute encephalopathy from possible acute drug abuse and possible drug overdose ?suicide  PULMONARY -Respiratory Failure -continue Full MV support -continue Bronchodilator Therapy -Wean Fio2 and PEEP as tolerated -will perform SAT/SBt when respiratory parameters are met   CARDIOVASCULAR -continue monitoring  RENAL Follow chem 7 -follow UO  GASTROINTESTINAL GI prophylaxis  HEMATOLOGIC -Follow h/h  INFECTIOUS -No infection at this point   ENDOCRINE -Follow FSBS  NEUROLOGIC - intubated and sedated - minimal sedation to achieve a RASS goal: -1   Plan for neuro assessment and wean vent as tolerated   I have personally obtained a history, examined the patient,  evaluated laboratory and independently reviewed  imaging results, formulated the assessment and plan and placed orders.  The Patient requires high complexity decision making for assessment and support, frequent evaluation and titration of therapies, application of advanced monitoring technologies and extensive interpretation of multiple databases. Critical Care Time devoted to patient care services described in this note is  45 minutes.   Overall, patient is critically ill, prognosis is guarded. Patient at high risk for cardiac arrest and death.    Corrin Parker, M.D.  [facility]  Medical Director [facility]

## 2014-11-13 NOTE — ED Notes (Signed)
Dr. Sheryle Hail at bedside for eval.

## 2014-11-13 NOTE — Progress Notes (Signed)
Initial Nutrition Assessment     INTERVENTION:   Coordination of Care: discussed nutritional poc during ICU rounds; per MD Kasa, hoping to extubate. If unable to extubate within 24 hours, recommend initiation of EN   NUTRITION DIAGNOSIS:   Inadequate oral intake related to acute illness as evidenced by NPO status.  GOAL:   Patient will meet greater than or equal to 90% of their needs  MONITOR:    (Energy Intake, Anthropometrics, Electrolyte/Renal Profile, Digestive System)  REASON FOR ASSESSMENT:   Ventilator    ASSESSMENT:    Pt admitted s/p overdose with AMS, acute respiratory failure requiring intubation  Past Medical History  Diagnosis Date  . Thyroid disease     Diet Order:  Diet NPO time specified  Food and nutrition related history: unable to assess  Electrolyte and Renal Profile:  Recent Labs Lab 11/12/14 2326  BUN 15  CREATININE 0.82  NA 141  K 3.8  MG 2.2   Glucose Profile: No results for input(s): GLUCAP in the last 72 hours.   Meds: NS at 125 ml/hr, diprivan, fentanyl  Nutrition Focused Physical Exam:  Unable to complete Nutrition-Focused physical exam at this time.    Height:   Ht Readings from Last 1 Encounters:  11/13/14  (1.727 m)    Weight: based on weight encounters, 2% wt loss in >1 month  Wt Readings from Last 1 Encounters:  11/13/14 313 lb 0.9 oz (142 kg)   Wt Readings from Last 10 Encounters:  11/13/14 313 lb 0.9 oz (142 kg)  10/01/14 320 lb (145.151 kg)  09/24/14 320 lb (145.151 kg)    BMI:  Body mass index is 47.61 kg/(m^2).  Estimated Nutritional Needs:   Kcal:  1610-9604 kcals (11-14 kcals/kg)   Protein:  140-175 g (2.0-2.5 g/kg) using IBW 70 kg  Fluid:  1750-2100 mL (25-30 ml/kg)   HIGH Care Level  Romelle Starcher MS, RD, LDN [phone] Pager

## 2014-11-13 NOTE — Progress Notes (Signed)
Notified Dr. Belia Heman of patients heart rate staying in the range from 37-38. Most recent blood pressure was 118/80 with map of 93. No new orders or changes at this time.

## 2014-11-13 NOTE — Progress Notes (Signed)
[facility] - [place] at [facility]   PATIENT NAME: [name]    MR#:  [id number]  DATE OF BIRTH:  December 09, 1990  SUBJECTIVE: 24 year old male patient admitted for acute respiratory failure secondary to possible drug overdose. Right now intubated, sedated. Seen in the ICU. They're trying to wean off sedation and see if he can be extubated. Patient's registered nurse spoke with family patient's father ,.according to him ,Joell (patientt) was trying to commit suicide. Patient's mother expired 6 months ago. He is living with his father. Father is not in the room will obtain more history.   CHIEF COMPLAINT:   Chief Complaint  Patient presents with  . Drug Overdose    REVIEW OF SYSTEMS:    Review of Systems  Unable to perform ROS: intubated    Nutrition: npo Tolerating Diet: Tolerating PT:      DRUG ALLERGIES:   Allergies  Allergen Reactions  . Sulfa Antibiotics     VITALS:  Blood pressure 104/67, pulse 50, temperature 97.9 F (36.6 C), temperature source Oral, resp. rate 20, height 5\' 8"  (1.727 m), weight 142 kg (313 lb 0.9 oz), SpO2 99 %.  PHYSICAL EXAMINATION:   Physical Exam  GENERAL:  24 y.o.-year-old patient lying in the bed abated, sedated.  EYES: Pupils equal, round, reactive to light.. No scleral icterus. Extraocular muscles intact.  HEENT: Head atraumatic, normocephalic. Oropharynx and nasopharynx clear.  NECK:  Supple, no jugular venous distention. No thyroid enlargement, no tenderness.  LUNGS: Normal breath sounds bilaterally, no wheezing, rales,rhonchi or crepitation. No use of accessory muscles of respiration.  CARDIOVASCULAR: S1, S2 normal. No murmurs, rubs, or gallops.  ABDOMEN: Soft, nontender, nondistended. Bowel sounds present. No organomegaly or mass.  EXTREMITIES: No pedal edema, cyanosis, or clubbing.  NEUROLOGIC: Intubated, sedated. PSYCHIATRIC: intubated , sedated. SKIN: No obvious rash, lesion, or ulcer.    LABORATORY  PANEL:   CBC  Recent Labs Lab 11/12/14 2326  WBC 10.6  HGB 15.0  HCT 44.6  PLT 167   ------------------------------------------------------------------------------------------------------------------  Chemistries   Recent Labs Lab 11/12/14 2326  NA 141  K 3.8  CL 108  CO2 27  GLUCOSE 93  BUN 15  CREATININE 0.82  CALCIUM 8.5*  MG 2.2  AST 21  ALT 28  ALKPHOS 51  BILITOT 0.7   ------------------------------------------------------------------------------------------------------------------  Cardiac Enzymes  Recent Labs Lab 11/12/14 2326  TROPONINI <0.03   ------------------------------------------------------------------------------------------------------------------  RADIOLOGY:  Ct Head Wo Contrast  11/13/2014   CLINICAL DATA:  History drug overdose. Unresponsive. Found down on floor. Difficult to arouse.  EXAM: CT HEAD WITHOUT CONTRAST  TECHNIQUE: Contiguous axial images were obtained from the base of the skull through the vertex without intravenous contrast.  COMPARISON:  04/19/2013  FINDINGS: Ventricles and sulci appear symmetrical. No mass effect or midline shift. No abnormal extra-axial fluid collections. Gray-white matter junctions are distinct. Basal cisterns are not effaced. No evidence of acute intracranial hemorrhage. No depressed skull fractures. Visualized paranasal sinuses and mastoid air cells are not opacified.  IMPRESSION: No acute intracranial abnormalities.   Electronically Signed   By: Burman Nieves M.D.   On: 11/13/2014 00:50   Dg Chest Portable 1 View  11/13/2014   CLINICAL DATA:  Post intubation  EXAM: PORTABLE CHEST - 1 VIEW  COMPARISON:  06/30/2009  FINDINGS: Interval placement of an endotracheal tube with tip measuring 3.2 cm above the carina. Shallow inspiration. Heart size and pulmonary vascularity are normal for technique. No focal airspace disease or consolidation in the  lungs. No blunting of costophrenic angles. No pneumothorax.   IMPRESSION: Endotracheal tube tip measures 3.2 cm above the carina. Shallow inspiration. No evidence of active pulmonary disease.   Electronically Signed   By: Burman Nieves M.D.   On: 11/13/2014 02:19     ASSESSMENT AND PLAN:   Active Problems:   Respiratory failure   1,Acute respiratory failure secondary to possible drug overdose.; Pulmonary following. Patient to be weaned off the ventilator,and try weaning trials. 2. possible suicidal attempt; if he is extubated we will now have suicidal precautions, seated at bedside, psychiatric evaluation. 3. Bipolar disorder: Continue nothing by mouth, patient was on Lamictal, Lexapro these medications should be addressed by psychiatry CODE STATUS full code.     All the records are reviewed and case discussed with Care Management/Social Workerr. Management plans discussed with the patient, family and they are in agreement.  CODE STATUS: Full  TOTAL TIME TAKING CARE OF THIS PATIENT: 35 minutes. Critical care  POSSIBLE D/C IN 1-2 DAYS, DEPENDING ON CLINICAL CONDITION.   Katha Hamming M.D on 11/13/2014 at 12:11 PM  Between 7am to 6pm - Pager - [phone]  After 6pm go to [email or web address] - password [email or web address] [facility]  [place] [facility]  Office  [phone]  CC: Primary care physician; Derwood Kaplan, MD

## 2014-11-13 NOTE — ED Notes (Signed)
Patient transported to CT via stretcher.

## 2014-11-13 NOTE — Progress Notes (Signed)
Notified Dr. Belia Heman of patient with low heart rate down to 37. Order received to change monitor alarm parameters to a low of 35. Other vital signs have been within normal limits. Will continue to assess and monitor.

## 2014-11-13 NOTE — ED Notes (Signed)
MD at bedside for reeval

## 2014-11-13 NOTE — Progress Notes (Signed)
Discussed with patient's father at bedside that prior to EMS arrival the patient was able to verbalize that he had taken a handful of Xanax 2 mg. His father is concerned this may be a suicide attempt. The patient has an extensive psychiatric history as well as incidence of overdose in the past.

## 2014-11-13 NOTE — Care Management Note (Addendum)
Case Management Note  Patient Details  Name: [name] MRN: [id number] Date of Birth: [birth year]  Subjective/Objective:  RNCM consult for concerns of patient stealing prescriptions from his dad who is on disability. Will discuss with CSW and follow.  Psych consult pending.                 Action/Plan:   Expected Discharge Date:                  Expected Discharge Plan:     In-House Referral:  Clinical Social Work  Discharge planning Services  CM Consult  Post Acute Care Choice:    Choice offered to:     DME Arranged:    DME Agency:     HH Arranged:    HH Agency:     Status of Service:  In process, will continue to follow  Medicare Important Message Given:    Date Medicare IM Given:    Medicare IM give by:    Date Additional Medicare IM Given:    Additional Medicare Important Message give by:     If discussed at Long Length of Stay Meetings, dates discussed:    Additional Comments:  Marily Memos, RN 11/13/2014, 3:06 PM

## 2014-11-14 DIAGNOSIS — T50901A Poisoning by unspecified drugs, medicaments and biological substances, accidental (unintentional), initial encounter: Secondary | ICD-10-CM | POA: Diagnosis present

## 2014-11-14 MED ORDER — LORAZEPAM 2 MG/ML IJ SOLN
INTRAMUSCULAR | Status: AC
  Administered 2014-11-14: 2 mg
  Filled 2014-11-14: qty 1

## 2014-11-14 MED ORDER — LEVOTHYROXINE SODIUM 100 MCG IV SOLR
37.5000 ug | Freq: Every day | INTRAVENOUS | Status: DC
Start: 2014-11-14 — End: 2014-11-18
  Administered 2014-11-14 – 2014-11-18 (×5): 37.5 ug via INTRAVENOUS
  Filled 2014-11-14 (×5): qty 5

## 2014-11-14 MED ORDER — ZIPRASIDONE MESYLATE 20 MG IM SOLR
20.0000 mg | Freq: Two times a day (BID) | INTRAMUSCULAR | Status: DC | PRN
  Administered 2014-11-14 – 2014-11-17 (×3): 20 mg via INTRAMUSCULAR
  Filled 2014-11-14 (×4): qty 20

## 2014-11-14 MED ORDER — DIAZEPAM 5 MG/ML IJ SOLN
5.0000 mg | INTRAMUSCULAR | Status: DC | PRN
  Administered 2014-11-14 (×2): 5 mg via INTRAVENOUS
  Filled 2014-11-14 (×2): qty 2

## 2014-11-14 MED ORDER — STERILE WATER FOR INJECTION IJ SOLN
INTRAMUSCULAR | Status: AC
  Administered 2014-11-14: 12:00:00
  Filled 2014-11-14: qty 10

## 2014-11-14 MED ORDER — ZIPRASIDONE MESYLATE 20 MG IM SOLR
20.0000 mg | Freq: Once | INTRAMUSCULAR | Status: AC
  Administered 2014-11-14: 20 mg via INTRAMUSCULAR
  Filled 2014-11-14 (×2): qty 20

## 2014-11-14 MED ORDER — HALOPERIDOL LACTATE 5 MG/ML IJ SOLN
5.0000 mg | Freq: Once | INTRAMUSCULAR | Status: AC
  Administered 2014-11-14: 5 mg via INTRAVENOUS
  Filled 2014-11-14: qty 1

## 2014-11-14 MED ORDER — DEXMEDETOMIDINE HCL IN NACL 400 MCG/100ML IV SOLN
0.4000 ug/kg/h | INTRAVENOUS | Status: DC
  Administered 2014-11-14: 1 ug/kg/h via INTRAVENOUS
  Administered 2014-11-14: 0.7 ug/kg/h via INTRAVENOUS
  Administered 2014-11-14: 0.8 ug/kg/h via INTRAVENOUS
  Administered 2014-11-14: 1.1 ug/kg/h via INTRAVENOUS
  Administered 2014-11-15 (×2): 0.984 ug/kg/h via INTRAVENOUS
  Administered 2014-11-15: 1 ug/kg/h via INTRAVENOUS
  Filled 2014-11-14 (×8): qty 100

## 2014-11-14 MED ORDER — DIPHENHYDRAMINE HCL 50 MG/ML IJ SOLN
50.0000 mg | Freq: Once | INTRAMUSCULAR | Status: AC
  Administered 2014-11-14: 50 mg via INTRAVENOUS
  Filled 2014-11-14: qty 1

## 2014-11-14 MED ORDER — LORAZEPAM 2 MG/ML IJ SOLN
2.0000 mg | Freq: Once | INTRAMUSCULAR | Status: AC
  Administered 2014-11-15: 2 mg via INTRAVENOUS
  Filled 2014-11-14: qty 1

## 2014-11-14 NOTE — Progress Notes (Signed)
Dr. Belia Heman in the room and gave a verbal order to extubate. He was suctioned for a small amount of thick tan secretions prior to extubation. He was extubated to a 6L nasal cannula. He is voicing and has a weak cough after extubation.

## 2014-11-14 NOTE — Progress Notes (Signed)
Pt was extubated this morning at 0830.  He was extremely agitated, angry and impulsive. Wanting in and OOB. Very unsteady on feet. Unable to reason. Confused to place, time and situation. Wanting to go home now. Wanting cigarette. Wanting catheter out. Told Dr Letha Cape he did not try to kill himself.

## 2014-11-14 NOTE — Progress Notes (Signed)
Speech Therapy Note: received order, reviewed chart notes. Pt is currently lethargic, recently extubated this morning and tolerating this w/ 6 liters [place] support. Pt has a sitter in the room and has been agitated per NSG. This increases pt's risk for aspiration. Rec. Continue NPO status at this time w/ alternative means for meds such as IV w/ oral care as able for hygiene and stim. ST services will f/u tomorrow w/ BSE. NSG updated and agreed.

## 2014-11-14 NOTE — Consult Note (Signed)
[facility] [place] [facility] Consultation      Name: [name] MRN: [id number] DOB: [birth year]    ADMISSION DATE:  11/12/2014   CHIEF COMPLAINT:   Acute mental status changes, resp failure   HISTORY OF PRESENT ILLNESS  Plan for SAT/SBt today and extubation, patient very agitated, but responds to verbal stimuli, good cough and gag reflex    SIGNIFICANT EVENTS   Intubated 9/6 Suicide attempt?   PAST MEDICAL HISTORY    :  Past Medical History  Diagnosis Date  . Thyroid disease    History reviewed. No pertinent past surgical history. Prior to Admission medications   Medication Sig Start Date End Date Taking? Authorizing Provider  citalopram (CELEXA) 40 MG tablet Take 40 mg by mouth daily.    Historical Provider, MD  esomeprazole (NEXIUM) 40 MG capsule Take 40 mg by mouth daily at 12 noon.    Historical Provider, MD  ketorolac (TORADOL) 10 MG tablet Take 1 tablet (10 mg total) by mouth every 8 (eight) hours as needed. 09/24/14   Darci Current, MD  lamoTRIgine (LAMICTAL) 150 MG tablet Take 150 mg by mouth 2 (two) times daily.    Historical Provider, MD  levothyroxine (SYNTHROID, LEVOTHROID) 25 MCG tablet Take 25 mcg by mouth daily before breakfast.    Historical Provider, MD  naltrexone (DEPADE) 50 MG tablet Take 25 mg by mouth daily.    Historical Provider, MD  oxyCODONE-acetaminophen (PERCOCET) 10-325 MG per tablet Take 1 tablet by mouth every 6 (six) hours as needed for pain. 10/01/14 10/01/15  Darci Current, MD   Allergies  Allergen Reactions  . Sulfa Antibiotics Other (See Comments)    Reaction:  Unknown      FAMILY HISTORY   No family history on file.    SOCIAL HISTORY    reports that he has been smoking Cigarettes.  He has been smoking about 1.00 pack per day. He does not have any smokeless tobacco history on file. He reports that he does not drink alcohol. His drug history is not on file.  Review of Systems  Unable to perform ROS: critical  illness      VITAL SIGNS    Temp:  [97.3 F (36.3 C)-98 F (36.7 C)] 98 F (36.7 C) (09/08 0726) Pulse Rate:  [30-51] 44 (09/08 0726) Resp:  [20] 20 (09/08 0726) BP: (101-153)/(60-90) 121/70 mmHg (09/08 0700) SpO2:  [97 %-100 %] 98 % (09/08 0726) FiO2 (%):  [30 %-40 %] 40 % (09/08 0815) Weight:  [322 lb 8.5 oz (146.3 kg)] 322 lb 8.5 oz (146.3 kg) (09/08 0541) HEMODYNAMICS:   VENTILATOR SETTINGS: Vent Mode:  [-] PRVC FiO2 (%):  [30 %-40 %] 40 % Set Rate:  [20 bmp] 20 bmp Vt Set:  [550 mL] 550 mL PEEP:  [5 cmH20] 5 cmH20 INTAKE / OUTPUT:  Intake/Output Summary (Last 24 hours) at 11/14/14 0916 Last data filed at 11/14/14 0800  Gross per 24 hour  Intake 1786.94 ml  Output   1000 ml  Net 786.94 ml       PHYSICAL EXAM   Physical Exam  Constitutional: No distress.  HENT:  Head: Normocephalic and atraumatic.  Eyes: Pupils are equal, round, and reactive to light. No scleral icterus.  Neck: Normal range of motion. Neck supple.  Cardiovascular: Normal rate and regular rhythm.   No murmur heard. Pulmonary/Chest: No respiratory distress. He has no wheezes. He has rales.  Abdominal: Soft. He exhibits no distension. There is no tenderness.  Musculoskeletal: He exhibits no edema.  Neurological: He displays normal reflexes. Coordination normal.  gcs<8T  Skin: Skin is warm. No rash noted. He is not diaphoretic.       LABS   LABS:  CBC  Recent Labs Lab 11/12/14 2326  WBC 10.6  HGB 15.0  HCT 44.6  PLT 167   Coag's No results for input(s): APTT, INR in the last 168 hours. BMET  Recent Labs Lab 11/12/14 2326  NA 141  K 3.8  CL 108  CO2 27  BUN 15  CREATININE 0.82  GLUCOSE 93   Electrolytes  Recent Labs Lab 11/12/14 2326  CALCIUM 8.5*  MG 2.2   Sepsis Markers  Recent Labs Lab 11/12/14 2343  LATICACIDVEN 0.7   ABG  Recent Labs Lab 11/13/14 0226  PHART 7.26*  PCO2ART 56*  PO2ART 68*   Liver Enzymes  Recent Labs Lab  11/12/14 2326  AST 21  ALT 28  ALKPHOS 51  BILITOT 0.7  ALBUMIN 3.8   Cardiac Enzymes  Recent Labs Lab 11/12/14 2326  TROPONINI <0.03   Glucose No results for input(s): GLUCAP in the last 168 hours.   Recent Results (from the past 240 hour(s))  MRSA PCR Screening     Status: None   Collection Time: 11/13/14  3:40 AM  Result Value Ref Range Status   MRSA by PCR NEGATIVE NEGATIVE Final    Comment:        The GeneXpert MRSA Assay (FDA approved for NASAL specimens only), is one component of a comprehensive MRSA colonization surveillance program. It is not intended to diagnose MRSA infection nor to guide or monitor treatment for MRSA infections.      Current facility-administered medications:  .  0.9 %  sodium chloride infusion, , Intravenous, Continuous, Arnaldo Natal, MD, Last Rate: 125 mL/hr at 11/14/14 0700 .  acetaminophen (TYLENOL) tablet 650 mg, 650 mg, Oral, Q6H PRN **OR** acetaminophen (TYLENOL) suppository 650 mg, 650 mg, Rectal, Q6H PRN, Arnaldo Natal, MD .  ALPRAZolam Prudy Feeler) tablet 1 mg, 1 mg, Oral, TID, Erin Fulling, MD, 1 mg at 11/13/14 2136 .  antiseptic oral rinse solution (CORINZ), 7 mL, Mouth Rinse, QID, Arnaldo Natal, MD, 7 mL at 11/14/14 0515 .  chlorhexidine gluconate (PERIDEX) 0.12 % solution 15 mL, 15 mL, Mouth Rinse, BID, Arnaldo Natal, MD, 15 mL at 11/13/14 2142 .  citalopram (CELEXA) tablet 40 mg, 40 mg, Oral, Daily, Arnaldo Natal, MD, 40 mg at 11/13/14 0824 .  dexmedetomidine (PRECEDEX) 400 MCG/100ML (4 mcg/mL) infusion, 0.4-1.2 mcg/kg/hr, Intravenous, Titrated, Erin Fulling, MD .  fentaNYL in NS (66mcg/ml) infusion-PREMIX, 10 mcg/hr, Intravenous, Continuous, Loleta Rose, MD, Last Rate: 20 mL/hr at 11/14/14 0800, 200 mcg/hr at 11/14/14 0800 .  heparin injection 5,000 Units, 5,000 Units, Subcutaneous, 3 times per day, Arnaldo Natal, MD, 5,000 Units at 11/14/14 0515 .  Influenza vac split quadrivalent PF  (FLUARIX) injection 0.5 mL, 0.5 mL, Intramuscular, Tomorrow-1000, Arnaldo Natal, MD .  lamoTRIgine (LAMICTAL) tablet 150 mg, 150 mg, Oral, BID, Erin Fulling, MD, 150 mg at 11/13/14 2137 .  levothyroxine (SYNTHROID, LEVOTHROID) tablet 25 mcg, 25 mcg, Per Tube, QAC breakfast, Arnaldo Natal, MD, 25 mcg at 11/14/14 0825 .  morphine 2 MG/ML injection 2 mg, 2 mg, Intravenous, Q4H PRN, Arnaldo Natal, MD .  pantoprazole (PROTONIX) injection 40 mg, 40 mg, Intravenous, Q24H, Zigmund Gottron, MD, 40 mg at 11/14/14 0515 .  propofol (DIPRIVAN) 1000 MG/100ML infusion,  5-80 mcg/kg/min, Intravenous, Titrated, Arnaldo Natal, MD, Last Rate: 21.6 mL/hr at 11/14/14 0800, 25 mcg/kg/min at 11/14/14 0800 .  vecuronium (NORCURON) injection 10 mg, 10 mg, Intravenous, Q1H PRN, Arnaldo Natal, MD, 10 mg at 11/13/14 0719 .  ziprasidone (GEODON) injection 20 mg, 20 mg, Intramuscular, Once, Katha Hamming, MD  IMAGING    No results found.    Indwelling Urinary Catheter continued, requirement due to   Reason to continue Indwelling Urinary Catheter for strict Intake/Output monitoring for hemodynamic instability         Ventilator continued, requirement due to, resp failure    Ventilator Sedation RASS 0 to -2   MAJOR EVENTS/TEST RESULTS: intubated 9/6 CT head and CXR no acute issues    INDWELLING DEVICES::  MICRO DATA: MRSA PCR >>negative Urine  Blood Resp   ANTIMICROBIALS:     ASSESSMENT/PLAN    24 yo obese white male admitted to ICu for acute encephalopathy from acute drug abuse and  drug overdose ?suicide  PULMONARY -Respiratory Failure-plan for extubation -oxygen therapy as needed   CARDIOVASCULAR -continue monitoring  RENAL Follow chem 7 -follow UO  GASTROINTESTINAL GI prophylaxis  HEMATOLOGIC -Follow h/h  INFECTIOUS -No infection at this point   ENDOCRINE -Follow FSBS  NEUROLOGIC -wean sedation, precedex as needed -psych eval, geodon as  needed     I have personally obtained a history, examined the patient, evaluated laboratory and independently reviewed  imaging results, formulated the assessment and plan and placed orders.  The Patient requires high complexity decision making for assessment and support, frequent evaluation and titration of therapies, application of advanced monitoring technologies and extensive interpretation of multiple databases. Critical Care Time devoted to patient care services described in this note is 45 minutes.   Overall, patient is critically ill, prognosis is guarded. Lucie Leather, M.D.  [facility]  Medical Director [facility] Medical Director [facility] Cardio-Pulmonary Department

## 2014-11-14 NOTE — Progress Notes (Signed)
[facility] - [place] at [facility]   PATIENT NAME: [name]    MR#:  [id number]  DATE OF BIRTH:  Mar 08, 1991  SUBJECTIVE: 24 year old male patient admitted for acute respiratory failure secondary to possible drug overdose.extubated this morning.very restless,wants to go homed.spoke to  His father over phone,he is concerned that pt may have  Taken  twelve   Xanax tablets. ,as suicidal attempt.pts mother passes away recently and he is having trouble with adjusting with this,  CHIEF COMPLAINT:   Chief Complaint  Patient presents with  . Drug Overdose    REVIEW OF SYSTEMS:    Review of Systems  Unable to perform ROS: intubated    Nutrition: npo Tolerating Diet: Tolerating PT:      DRUG ALLERGIES:   Allergies  Allergen Reactions  . Sulfa Antibiotics Other (See Comments)    Reaction:  Unknown     VITALS:  Blood pressure 121/70, pulse 44, temperature 98 F (36.7 C), temperature source Oral, resp. rate 20, height  (1.727 m), weight 146.3 kg (322 lb 8.5 oz), SpO2 98 %.  PHYSICAL EXAMINATION:   Physical Exam  GENERAL:  24 y.o.-year-old patient lying in the bed abated, sedated.  EYES: Pupils equal, round, reactive to light.. No scleral icterus. Extraocular muscles intact.  HEENT: Head atraumatic, normocephalic. Oropharynx and nasopharynx clear.  NECK:  Supple, no jugular venous distention. No thyroid enlargement, no tenderness.  LUNGS: Normal breath sounds bilaterally, no wheezing, rales,rhonchi or crepitation. No use of accessory muscles of respiration.  CARDIOVASCULAR: S1, S2 normal. No murmurs, rubs, or gallops.  ABDOMEN: Soft, nontender, nondistended. Bowel sounds present. No organomegaly or mass.  EXTREMITIES: No pedal edema, cyanosis, or clubbing.  NEUROLOGIC: Intubated, sedated. PSYCHIATRIC: intubated , sedated. SKIN: No obvious rash, lesion, or ulcer.    LABORATORY PANEL:   CBC  Recent Labs Lab 11/12/14 2326  WBC 10.6   HGB 15.0  HCT 44.6  PLT 167   ------------------------------------------------------------------------------------------------------------------  Chemistries   Recent Labs Lab 11/12/14 2326  NA 141  K 3.8  CL 108  CO2 27  GLUCOSE 93  BUN 15  CREATININE 0.82  CALCIUM 8.5*  MG 2.2  AST 21  ALT 28  ALKPHOS 51  BILITOT 0.7   ------------------------------------------------------------------------------------------------------------------  Cardiac Enzymes  Recent Labs Lab 11/12/14 2326  TROPONINI <0.03   ------------------------------------------------------------------------------------------------------------------  RADIOLOGY:  Ct Head Wo Contrast  11/13/2014   CLINICAL DATA:  History drug overdose. Unresponsive. Found down on floor. Difficult to arouse.  EXAM: CT HEAD WITHOUT CONTRAST  TECHNIQUE: Contiguous axial images were obtained from the base of the skull through the vertex without intravenous contrast.  COMPARISON:  04/19/2013  FINDINGS: Ventricles and sulci appear symmetrical. No mass effect or midline shift. No abnormal extra-axial fluid collections. Gray-white matter junctions are distinct. Basal cisterns are not effaced. No evidence of acute intracranial hemorrhage. No depressed skull fractures. Visualized paranasal sinuses and mastoid air cells are not opacified.  IMPRESSION: No acute intracranial abnormalities.   Electronically Signed   By: Burman Nieves M.D.   On: 11/13/2014 00:50   Dg Chest Portable 1 View  11/13/2014   CLINICAL DATA:  Post intubation  EXAM: PORTABLE CHEST - 1 VIEW  COMPARISON:  06/30/2009  FINDINGS: Interval placement of an endotracheal tube with tip measuring 3.2 cm above the carina. Shallow inspiration. Heart size and pulmonary vascularity are normal for technique. No focal airspace disease or consolidation in the lungs. No blunting of costophrenic angles. No pneumothorax.  IMPRESSION: Endotracheal  tube tip measures 3.2 cm above the carina.  Shallow inspiration. No evidence of active pulmonary disease.   Electronically Signed   By: Burman Nieves M.D.   On: 11/13/2014 02:19     ASSESSMENT AND PLAN:   Active Problems:   Respiratory failure   1,Acute respiratory failure secondary to possible drug overdose.with xanax;; s/p extubation.  2. possible suicidal attempt;with xanax;have suicidal precautions,emergency  ivc papers filled. 3. Bipolar disorder: restarted his psych meds   4.sitter,IVC,psych consult Speech eval    All the records are reviewed and case discussed with Care Management/Social Workerr. Management plans discussed with the patient, family and they are in agreement.  CODE STATUS: Full  TOTAL TIME TAKING CARE OF THIS PATIENT: 35 minutes. POSSIBLE D/C IN 1-2 DAYS, DEPENDING ON CLINICAL CONDITION.   Katha Hamming M.D on 11/14/2014 at 9:25 AM  Between 7am to 6pm - Pager - [phone]  After 6pm go to [email or web address] - password [email or web address]  [place] [facility]  Office  [phone]  CC: Primary care physician; Derwood Kaplan, MD

## 2014-11-14 NOTE — Progress Notes (Signed)
Contacted by patient's nurse, patient combative, unsteady on feet, wants to leave. On arrival to unit, patient standing at bedside being stabilized with assistance of 3 staff members. Patient very unsteady on feet, states he can stand on his own but sways and seems unsteady. Patient states he needs to go to the bathroom. Assisted to bedside commode, but patient voided on floor. Stands back up, remains very unsteady. Dr. Luberta Mutter present. Emergency IVC process initiated by MD. Attempt to get patient to sit in chair, remained there briefly. Back up to feet, very unstable. Encouraged patient to return to bed, patient fighting. [employer] officers present, orderlies and other staff members got patient back to bed. Patient kicking legs. At 0945, patient appears more relaxed, intermittently sleeping. Sister at bedside.

## 2014-11-14 NOTE — Progress Notes (Signed)
Nutrition Follow-up   INTERVENTION:   Coordination of Care: awaiting diet progression post extubation   NUTRITION DIAGNOSIS:   Inadequate oral intake related to acute illness as evidenced by NPO status.Continue but being addressed as SLP consulted post extubation  GOAL:   Patient will meet greater than or equal to 90% of their needs   MONITOR:    (Energy Intake, Anthropometrics, Electrolyte/Renal Profile, Digestive System)  ASSESSMENT:    Pt s/p extubation this AM, pt very agitated/combative post extubation, very unsteady on feet, currently IVC, 1:1 sitter at bedside. Pt lethargic at present  Diet Order:  Diet NPO time specified, SLP consulted  Electrolyte and Renal Profile:  Recent Labs Lab 11/12/14 2326  BUN 15  CREATININE 0.82  NA 141  K 3.8  MG 2.2   Glucose Profile: No results for input(s): GLUCAP in the last 72 hours.   Meds: NS at 125 ml/hr, geodon  Height:   Ht Readings from Last 1 Encounters:  11/13/14  (1.727 m)    Weight:   Wt Readings from Last 1 Encounters:  11/14/14 322 lb 8.5 oz (146.3 kg)    BMI:  Body mass index is 49.05 kg/(m^2).  Estimated Nutritional Needs:   Kcal:  1750-2100 kcals   Protein:  70-84 g (1.0-1.2 g/kg)  Fluid:  1750-2100 mL (25-30 ml/kg)    HIGH Care Level  Romelle Starcher MS, RD, LDN [phone] Pager

## 2014-11-14 NOTE — Progress Notes (Signed)
Pt was seen by psychiatry.  Not waking up enough to talk with DR. He will see again tomorrow. Precedex at 0.7. Brief periods of restlessness.  Sitting forward and moving to left side to return to sleep. Safety sitter at bedside. 2 sisters have been in to see. Oldest sister would like to be caledl if needed to calm pt.  She will return at 9am tomorrow to assist with waking pt up in am

## 2014-11-14 NOTE — Consult Note (Signed)
  Psychiatry: Consult for this 24 year old man who probably took an overdose of benzodiazepine's. Consult received. Chart reviewed. Attempted to interview the patient but he is currently on arousable. Case discussed with nursing in the critical care unit. 24 year old man with a known history of substance abuse as well as mood instability was found unconscious at home. Became more obtunded after being brought to the hospital. Briefly intubated. Now extubated. Drug screen positive for benzodiazepine's but not opiates.  2 examination now the patient is extubated but sound asleep. He has been placed on a Precedex drip. I tried to wake him up by speaking his name several times and gently shaking his hand and shoulder but he made no indication of being arousable. Vital signs are stable. Oxygenation stable.  Nursing reports that the patient has been agitated when he woke up after extubation and appeared to be confused and potentially at risk of harming himself which is why he is now on Precedex.  Reasonable concern about possible suicidality until he can be interviewed and evaluated. Continue current treatment. I will come by again tomorrow to reevaluate and see if we can get a clear picture where he is mentally.  Benzodiazepine intoxication. Benzodiazepine withdrawal. Overdose on benzodiazepine's

## 2014-11-14 NOTE — Plan of Care (Signed)
Problem: ICU Phase Progression Outcomes Goal: Initial discharge plan identified Outcome: Progressing Psych consult was made with Dr Larey Days  This morning pt denied  that he attempted suicide.  Goal: Voiding-avoid urinary catheter unless indicated Outcome: Completed/Met Date Met:  11/14/14 Foley removed per pt request and MD order.  He has been incontinent of urine Goal: Other ICU Phase Outcomes/Goals Outcome: Progressing Sats 93-95% on 4L/[place]. Lungs clear.Diminished bibasilar  Problem: Phase I Progression Outcomes Goal: Pain controlled Outcome: Progressing Denies pain Goal: Progress activity as tolerated unless otherwise ordered Outcome: Progressing Pt has been up in chair. Up to [facility]. Dangling at bedside. He is unsteady and impulsive Goal: Tolerating diet Outcome: Not Progressing NPO until fully awake.  IV hydration

## 2014-11-14 NOTE — Significant Event (Signed)
Emergency involuntary commitment papers completed and placed in chart

## 2014-11-15 ENCOUNTER — Inpatient Hospital Stay: Payer: Federal, State, Local not specified - PPO

## 2014-11-15 DIAGNOSIS — J690 Pneumonitis due to inhalation of food and vomit: Secondary | ICD-10-CM | POA: Diagnosis present

## 2014-11-15 DIAGNOSIS — T50904A Poisoning by unspecified drugs, medicaments and biological substances, undetermined, initial encounter: Secondary | ICD-10-CM

## 2014-11-15 HISTORY — DX: Pneumonitis due to inhalation of food and vomit: J69.0

## 2014-11-15 LAB — CBC
HCT: 43 % (ref 40.0–52.0)
HEMOGLOBIN: 14.3 g/dL (ref 13.0–18.0)
MCH: 29.5 pg (ref 26.0–34.0)
MCHC: 33.4 g/dL (ref 32.0–36.0)
MCV: 88.3 fL (ref 80.0–100.0)
PLATELETS: 142 10*3/uL — AB (ref 150–440)
RBC: 4.87 MIL/uL (ref 4.40–5.90)
RDW: 13.9 % (ref 11.5–14.5)
WBC: 16.5 10*3/uL — AB (ref 3.8–10.6)

## 2014-11-15 LAB — BLOOD GAS, ARTERIAL
ALLENS TEST (PASS/FAIL): POSITIVE — AB
Acid-base deficit: 4.1 mmol/L — ABNORMAL HIGH (ref 0.0–2.0)
Bicarbonate: 22.2 mEq/L (ref 21.0–28.0)
FIO2: 1
Mechanical Rate: 15
O2 SAT: 99.8 %
PATIENT TEMPERATURE: 37
PCO2 ART: 44 mmHg (ref 32.0–48.0)
PEEP: 5 cmH2O
PH ART: 7.31 — AB (ref 7.350–7.450)
PO2 ART: 228 mmHg — AB (ref 83.0–108.0)
RATE: 15 resp/min
VT: 500 mL

## 2014-11-15 LAB — COMPREHENSIVE METABOLIC PANEL
ALBUMIN: 3.2 g/dL — AB (ref 3.5–5.0)
ALK PHOS: 48 U/L (ref 38–126)
ALT: 25 U/L (ref 17–63)
AST: 26 U/L (ref 15–41)
Anion gap: 5 (ref 5–15)
BUN: 7 mg/dL (ref 6–20)
CALCIUM: 8.1 mg/dL — AB (ref 8.9–10.3)
CHLORIDE: 114 mmol/L — AB (ref 101–111)
CO2: 25 mmol/L (ref 22–32)
CREATININE: 0.67 mg/dL (ref 0.61–1.24)
GFR calc Af Amer: >60 mL/min (ref >60)
GFR calc non Af Amer: >60 mL/min (ref >60)
GLUCOSE: 107 mg/dL — AB (ref 65–99)
Potassium: 4.1 mmol/L (ref 3.5–5.1)
SODIUM: 144 mmol/L (ref 135–145)
Total Bilirubin: 1 mg/dL (ref 0.3–1.2)
Total Protein: 5.9 g/dL — ABNORMAL LOW (ref 6.5–8.1)

## 2014-11-15 LAB — TRIGLYCERIDES: Triglycerides: 68 mg/dL (ref <150)

## 2014-11-15 LAB — PHOSPHORUS: Phosphorus: 4.1 mg/dL (ref 2.5–4.6)

## 2014-11-15 LAB — GLUCOSE, CAPILLARY
Glucose-Capillary: 102 mg/dL — ABNORMAL HIGH (ref 65–99)
Glucose-Capillary: 107 mg/dL — ABNORMAL HIGH (ref 65–99)
Glucose-Capillary: 114 mg/dL — ABNORMAL HIGH (ref 65–99)

## 2014-11-15 MED ORDER — SENNOSIDES-DOCUSATE SODIUM 8.6-50 MG PO TABS: 1.0000 | ORAL_TABLET | Freq: Two times a day (BID) | ORAL | Status: DC | PRN

## 2014-11-15 MED ORDER — MIDAZOLAM HCL 2 MG/2ML IJ SOLN
4.0000 mg | Freq: Once | INTRAMUSCULAR | Status: AC
  Administered 2014-11-15: 4 mg via INTRAVENOUS

## 2014-11-15 MED ORDER — VECURONIUM BROMIDE 10 MG IV SOLR
20.0000 mg | Freq: Once | INTRAVENOUS | Status: AC
  Administered 2014-11-15: 20 mg via INTRAVENOUS

## 2014-11-15 MED ORDER — FENTANYL CITRATE (PF) 100 MCG/2ML IJ SOLN
INTRAMUSCULAR | Status: AC
  Administered 2014-11-15: 200 ug via INTRAVENOUS
  Filled 2014-11-15: qty 4

## 2014-11-15 MED ORDER — MIDAZOLAM HCL 2 MG/2ML IJ SOLN
INTRAMUSCULAR | Status: AC
  Filled 2014-11-15: qty 4

## 2014-11-15 MED ORDER — LORAZEPAM 2 MG/ML IJ SOLN
2.0000 mg | INTRAMUSCULAR | Status: DC | PRN
  Administered 2014-11-15 (×4): 2 mg via INTRAVENOUS
  Administered 2014-11-15: 1 mg via INTRAVENOUS
  Administered 2014-11-15: 2 mg via INTRAVENOUS
  Filled 2014-11-15 (×6): qty 1

## 2014-11-15 MED ORDER — LORAZEPAM 2 MG/ML IJ SOLN
INTRAMUSCULAR | Status: AC
  Filled 2014-11-15: qty 1

## 2014-11-15 MED ORDER — PROPOFOL 1000 MG/100ML IV EMUL
5.0000 ug/kg/min | INTRAVENOUS | Status: DC
  Administered 2014-11-15: 19.958 ug/kg/min via INTRAVENOUS
  Administered 2014-11-15: 5 ug/kg/min via INTRAVENOUS
  Administered 2014-11-16: 30 ug/kg/min via INTRAVENOUS
  Administered 2014-11-16: 40 ug/kg/min via INTRAVENOUS
  Administered 2014-11-16: 50 ug/kg/min via INTRAVENOUS
  Administered 2014-11-16: 40 ug/kg/min via INTRAVENOUS
  Administered 2014-11-16: 24.977 ug/kg/min via INTRAVENOUS
  Administered 2014-11-16: 40.033 ug/kg/min via INTRAVENOUS
  Filled 2014-11-15 (×8): qty 100

## 2014-11-15 MED ORDER — FREE WATER
100.0000 mL | Status: DC
Start: 2014-11-15 — End: 2014-11-21
  Administered 2014-11-15 – 2014-11-21 (×34): 100 mL

## 2014-11-15 MED ORDER — MIDAZOLAM HCL 2 MG/2ML IJ SOLN
INTRAMUSCULAR | Status: AC
  Administered 2014-11-15: 4 mg via INTRAVENOUS
  Filled 2014-11-15: qty 4

## 2014-11-15 MED ORDER — SODIUM CHLORIDE 0.9 % IV BOLUS (SEPSIS)
1000.0000 mL | Freq: Once | INTRAVENOUS | Status: AC
  Administered 2014-11-15: 1000 mL via INTRAVENOUS

## 2014-11-15 MED ORDER — BUDESONIDE 0.5 MG/2ML IN SUSP
0.5000 mg | Freq: Two times a day (BID) | RESPIRATORY_TRACT | Status: DC
  Administered 2014-11-15 – 2014-11-18 (×6): 0.5 mg via RESPIRATORY_TRACT
  Filled 2014-11-15 (×6): qty 2

## 2014-11-15 MED ORDER — SODIUM CHLORIDE 0.9 % IV SOLN
INTRAVENOUS | Status: DC
Start: 2014-11-15 — End: 2014-11-18
  Administered 2014-11-15: 125 mL/h via INTRAVENOUS
  Administered 2014-11-16: 10:00:00 via INTRAVENOUS
  Administered 2014-11-16: 75 mL/h via INTRAVENOUS
  Administered 2014-11-17 – 2014-11-18 (×2): via INTRAVENOUS

## 2014-11-15 MED ORDER — IPRATROPIUM-ALBUTEROL 0.5-2.5 (3) MG/3ML IN SOLN
3.0000 mL | RESPIRATORY_TRACT | Status: DC
  Administered 2014-11-15 – 2014-11-18 (×18): 3 mL via RESPIRATORY_TRACT
  Filled 2014-11-15 (×18): qty 3

## 2014-11-15 MED ORDER — FENTANYL CITRATE (PF) 100 MCG/2ML IJ SOLN
INTRAMUSCULAR | Status: AC
  Filled 2014-11-15: qty 2

## 2014-11-15 MED ORDER — VITAL HIGH PROTEIN PO LIQD
1000.0000 mL | ORAL | Status: DC
  Administered 2014-11-15: 1000 mL
  Administered 2014-11-15: 19:00:00

## 2014-11-15 MED ORDER — FENTANYL CITRATE (PF) 100 MCG/2ML IJ SOLN
200.0000 ug | Freq: Once | INTRAMUSCULAR | Status: AC
  Administered 2014-11-15: 200 ug via INTRAVENOUS

## 2014-11-15 MED ORDER — MIDAZOLAM HCL 2 MG/2ML IJ SOLN
2.0000 mg | INTRAMUSCULAR | Status: DC | PRN
  Administered 2014-11-15 – 2014-11-16 (×3): 2 mg via INTRAVENOUS
  Filled 2014-11-15 (×3): qty 2

## 2014-11-15 MED ORDER — CLINDAMYCIN PHOSPHATE 600 MG/50ML IV SOLN
600.0000 mg | Freq: Three times a day (TID) | INTRAVENOUS | Status: DC
  Administered 2014-11-15 – 2014-11-16 (×4): 600 mg via INTRAVENOUS
  Filled 2014-11-15 (×7): qty 50

## 2014-11-15 MED ORDER — FENTANYL 2500MCG IN NS 250ML (10MCG/ML) PREMIX INFUSION
50.0000 ug/h | INTRAVENOUS | Status: DC
  Administered 2014-11-15: 200 ug/h via INTRAVENOUS
  Administered 2014-11-15: 50 ug via INTRAVENOUS
  Administered 2014-11-16: 250 ug/h via INTRAVENOUS
  Filled 2014-11-15 (×2): qty 250

## 2014-11-15 MED ORDER — FENTANYL BOLUS VIA INFUSION
50.0000 ug | INTRAVENOUS | Status: DC | PRN
  Administered 2014-11-15: 50 ug via INTRAVENOUS
  Administered 2014-11-15: 100 ug via INTRAVENOUS
  Administered 2014-11-15 (×2): 50 ug via INTRAVENOUS
  Filled 2014-11-15: qty 100

## 2014-11-15 MED ORDER — FENTANYL 2500MCG IN NS 250ML (10MCG/ML) PREMIX INFUSION
INTRAVENOUS | Status: AC
  Administered 2014-11-15: 50 ug via INTRAVENOUS
  Filled 2014-11-15: qty 250

## 2014-11-15 NOTE — Progress Notes (Signed)
Speech Therapy Note: reviewed chart notes; consulted NSG re: pt's status. Pt has been orally intubated d/t decline in respiratory status since yesterday. ST will hold on BSE until pt's medical/respiratory status' are appropriate.

## 2014-11-15 NOTE — Progress Notes (Signed)
PHARMACY - CRITICAL CARE PROGRESS NOTE  Pharmacy Consult for Constipation Prevention    Allergies  Allergen Reactions  . Sulfa Antibiotics Other (See Comments)    Reaction:  Unknown     Patient Measurements: Height:  (172.7 cm) Weight: (!) 314 lb 13.1 oz (142.8 kg) IBW/kg (Calculated) : 68.4  Vital Signs: Temp: 98.4 F (36.9 C) (09/09 1151) Temp Source: Axillary (09/09 1151) BP: 129/44 mmHg (09/09 1100) Pulse Rate: 77 (09/09 1105) Intake/Output from previous day: 09/08 0701 - 09/09 0700 In: 3140.1 [I.V.:3140.1] Out: -  Intake/Output from this shift: Total I/O In: 1183.3 [I.V.:1183.3] Out: 1000 [Urine:1000] Vent settings for last 24 hours: Vent Mode:  [-] PRVC FiO2 (%):  [60 %-100 %] 60 % Set Rate:  [18 bmp] 18 bmp Vt Set:  [500 mL] 500 mL PEEP:  [5 cmH20] 5 cmH20  Labs:  Recent Labs  11/12/14 2326 11/15/14 1059  WBC 10.6 16.5*  HGB 15.0 14.3  HCT 44.6 43.0  PLT 167 142*  CREATININE 0.82  --   MG 2.2  --   ALBUMIN 3.8  --   PROT 6.2*  --   AST 21  --   ALT 28  --   ALKPHOS 51  --   BILITOT 0.7  --    Estimated Creatinine Clearance: 192.9 mL/min (by C-G formula based on Cr of 0.82).  No results for input(s): GLUCAP in the last 72 hours.  Microbiology: Recent Results (from the past 720 hour(s))  MRSA PCR Screening     Status: None   Collection Time: 11/13/14  3:40 AM  Result Value Ref Range Status   MRSA by PCR NEGATIVE NEGATIVE Final    Comment:        The GeneXpert MRSA Assay (FDA approved for NASAL specimens only), is one component of a comprehensive MRSA colonization surveillance program. It is not intended to diagnose MRSA infection nor to guide or monitor treatment for MRSA infections.     Medications:  Scheduled:  . antiseptic oral rinse  7 mL Mouth Rinse QID  . budesonide (PULMICORT) nebulizer solution  0.5 mg Nebulization BID  . chlorhexidine gluconate  15 mL Mouth Rinse BID  . clindamycin (CLEOCIN) IV  600 mg Intravenous  3 times per day  . heparin  5,000 Units Subcutaneous 3 times per day  . Influenza vac split quadrivalent PF  0.5 mL Intramuscular Tomorrow-1000  . ipratropium-albuterol  3 mL Nebulization Q4H  . levothyroxine  37.5 mcg Intravenous Daily  . LORazepam      . pantoprazole (PROTONIX) IV  40 mg Intravenous Q24H   Infusions:  . fentaNYL infusion INTRAVENOUS 150 mcg/hr (11/15/14 1120)  . propofol (DIPRIVAN) infusion Stopped (11/15/14 0940)   PRN: acetaminophen **OR** acetaminophen, fentaNYL, midazolam, morphine injection, senna-docusate, ziprasidone  Assessment: [name] with acute respiratory failure currently intubated and sedated on propofol and fentanyl.   Plan:  Patient has not had a BM to date this admission so will order senna/docusate 1 tablet po bid prn and f/u AM.   [name] D 11/15/2014,12:12 PM
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
## 2014-11-15 NOTE — Consult Note (Signed)
[facility] Consultation      Name: [name] MRN: [id number] DOB: 1990-08-23    ADMISSION DATE:  11/12/2014   CHIEF COMPLAINT:   Acute mental status changes, resp failure   HISTORY OF PRESENT ILLNESS  Patient failed trial of extubation, placed on 100% NRB mask last night, patient with  Increased WOB, using accessory muscles to breathe, patient with evidence of aspiration pneumonia  Patient emergently intubated,sedated. Central line placed emergently     PAST MEDICAL HISTORY    :  Past Medical History  Diagnosis Date  . Thyroid disease    History reviewed. No pertinent past surgical history. Prior to Admission medications   Medication Sig Start Date End Date Taking? Authorizing Provider  citalopram (CELEXA) 40 MG tablet Take 40 mg by mouth daily.    Historical Provider, MD  esomeprazole (NEXIUM) 40 MG capsule Take 40 mg by mouth daily at 12 noon.    Historical Provider, MD  ketorolac (TORADOL) 10 MG tablet Take 1 tablet (10 mg total) by mouth every 8 (eight) hours as needed. 09/24/14   Gregor Hams, MD  lamoTRIgine (LAMICTAL) 150 MG tablet Take 150 mg by mouth 2 (two) times daily.    Historical Provider, MD  levothyroxine (SYNTHROID, LEVOTHROID) 25 MCG tablet Take 25 mcg by mouth daily before breakfast.    Historical Provider, MD  naltrexone (DEPADE) 50 MG tablet Take 25 mg by mouth daily.    Historical Provider, MD  oxyCODONE-acetaminophen (PERCOCET) 10-325 MG per tablet Take 1 tablet by mouth every 6 (six) hours as needed for pain. 10/01/14 10/01/15  Gregor Hams, MD   Allergies  Allergen Reactions  . Sulfa Antibiotics Other (See Comments)    Reaction:  Unknown      FAMILY HISTORY   No family history on file.    SOCIAL HISTORY    reports that he has been smoking Cigarettes.  He has been smoking about 1.00 pack per day. He does not have any smokeless tobacco history on file. He reports that he does not drink alcohol. His drug  history is not on file.  Review of Systems  Unable to perform ROS: critical illness      VITAL SIGNS    Temp:  [97.8 F (36.6 C)] 97.8 F (36.6 C) (09/08 1500) Pulse Rate:  [29-109] 54 (09/09 0700) Resp:  [13-31] 18 (09/09 0622) BP: (109-145)/(74-106) 145/106 mmHg (09/09 0700) SpO2:  [90 %-100 %] 93 % (09/09 0622) Weight:  [314 lb 13.1 oz (142.8 kg)] 314 lb 13.1 oz (142.8 kg) (09/09 0435) HEMODYNAMICS:   VENTILATOR SETTINGS:   INTAKE / OUTPUT:  Intake/Output Summary (Last 24 hours) at 11/15/14 0837 Last data filed at 11/14/14 1700  Gross per 24 hour  Intake 1247.8 ml  Output      0 ml  Net 1247.8 ml       PHYSICAL EXAM   Physical Exam  Constitutional: He appears distressed.  HENT:  Head: Normocephalic and atraumatic.  Eyes: Pupils are equal, round, and reactive to light. No scleral icterus.  Neck: Normal range of motion. Neck supple.  Cardiovascular: Normal rate and regular rhythm.   No murmur heard. Pulmonary/Chest: He is in respiratory distress. He has no wheezes. He has rales.  Abdominal: Soft. He exhibits no distension. There is no tenderness.  Musculoskeletal: He exhibits no edema.  Neurological: He displays normal reflexes. Coordination normal.  gcs<8T  Skin: Skin is warm. No rash noted. He is diaphoretic.  LABS   LABS:  CBC  Recent Labs Lab 11/12/14 2326  WBC 10.6  HGB 15.0  HCT 44.6  PLT 167   Coag's No results for input(s): APTT, INR in the last 168 hours. BMET  Recent Labs Lab 11/12/14 2326  NA 141  K 3.8  CL 108  CO2 27  BUN 15  CREATININE 0.82  GLUCOSE 93   Electrolytes  Recent Labs Lab 11/12/14 2326  CALCIUM 8.5*  MG 2.2   Sepsis Markers  Recent Labs Lab 11/12/14 2343  LATICACIDVEN 0.7   ABG  Recent Labs Lab 11/13/14 0226  PHART 7.26*  PCO2ART 56*  PO2ART 68*   Liver Enzymes  Recent Labs Lab 11/12/14 2326  AST 21  ALT 28  ALKPHOS 51  BILITOT 0.7  ALBUMIN 3.8   Cardiac  Enzymes  Recent Labs Lab 11/12/14 2326  TROPONINI <0.03   Glucose No results for input(s): GLUCAP in the last 168 hours.   Recent Results (from the past 240 hour(s))  MRSA PCR Screening     Status: None   Collection Time: 11/13/14  3:40 AM  Result Value Ref Range Status   MRSA by PCR NEGATIVE NEGATIVE Final    Comment:        The GeneXpert MRSA Assay (FDA approved for NASAL specimens only), is one component of a comprehensive MRSA colonization surveillance program. It is not intended to diagnose MRSA infection nor to guide or monitor treatment for MRSA infections.      Current facility-administered medications:  .  0.9 %  sodium chloride infusion, , Intravenous, Continuous, Harrie Foreman, MD, Last Rate: 125 mL/hr at 11/14/14 2129 .  acetaminophen (TYLENOL) tablet 650 mg, 650 mg, Oral, Q6H PRN **OR** acetaminophen (TYLENOL) suppository 650 mg, 650 mg, Rectal, Q6H PRN, Harrie Foreman, MD .  antiseptic oral rinse solution (CORINZ), 7 mL, Mouth Rinse, QID, Harrie Foreman, MD, 7 mL at 11/14/14 1200 .  chlorhexidine gluconate (PERIDEX) 0.12 % solution 15 mL, 15 mL, Mouth Rinse, BID, Harrie Foreman, MD, 15 mL at 11/14/14 0800 .  dexmedetomidine (PRECEDEX) 400 MCG/100ML (4 mcg/mL) infusion, 0.4-1.2 mcg/kg/hr, Intravenous, Titrated, Flora Lipps, MD, Last Rate: 36 mL/hr at 11/15/14 0609, 0.984 mcg/kg/hr at 11/15/14 0609 .  diazepam (VALIUM) injection 5 mg, 5 mg, Intravenous, Q4H PRN, Flora Lipps, MD, 5 mg at 11/14/14 2026 .  fentaNYL (SUBLIMAZE) 100 MCG/2ML injection, , , ,  .  fentaNYL (SUBLIMAZE) 100 MCG/2ML injection, , , ,  .  fentaNYL (SUBLIMAZE) bolus via infusion 50-100 mcg, 50-100 mcg, Intravenous, Q1H PRN, Flora Lipps, MD .  fentaNYL (SUBLIMAZE) injection 200 mcg, 200 mcg, Intravenous, Once, Flora Lipps, MD .  fentaNYL 2546mg in NS 2521m(1038mml) infusion-PREMIX, 5 mcg/hr, Intravenous, Continuous, KurFlora LippsD .  heparin injection 5,000 Units, 5,000 Units,  Subcutaneous, 3 times per day, MicHarrie ForemanD, 5,000 Units at 11/15/14 [id number] Influenza vac split quadrivalent PF (FLUARIX) injection 0.5 mL, 0.5 mL, Intramuscular, Tomorrow-1000, MicHarrie ForemanD .  levothyroxine (SYNTHROID, LEVOTHROID) injection 37.5 mcg, 37.5 mcg, Intravenous, Daily, KurFlora LippsD, 37.5 mcg at 11/14/14 1215 .  LORazepam (ATIVAN) 2 MG/ML injection, , , ,  .  LORazepam (ATIVAN) injection 2-3 mg, 2-3 mg, Intravenous, Q1H PRN, DavLytle ButteD, 2 mg at 11/15/14 0608 .  midazolam (VERSED) 2 MG/2ML injection, , , ,  .  midazolam (VERSED) 2 MG/2ML injection, , , ,  .  midazolam (VERSED) injection 4 mg, 4 mg, Intravenous, Once,  Flora Lipps, MD .  morphine 2 MG/ML injection 2 mg, 2 mg, Intravenous, Q4H PRN, Harrie Foreman, MD .  pantoprazole (PROTONIX) injection 40 mg, 40 mg, Intravenous, Q24H, Colbert Coyer, MD, 40 mg at 11/15/14 0507 .  propofol (DIPRIVAN) 1000 MG/100ML infusion, 5-80 mcg/kg/min, Intravenous, Titrated, Flora Lipps, MD .  vecuronium (NORCURON) injection 20 mg, 20 mg, Intravenous, Once, Flora Lipps, MD .  ziprasidone (GEODON) injection 20 mg, 20 mg, Intramuscular, BID PRN, Flora Lipps, MD, 20 mg at 11/14/14 2129  IMAGING    No results found.    Indwelling Urinary Catheter continued, requirement due to   Reason to continue Indwelling Urinary Catheter for strict Intake/Output monitoring for hemodynamic instability         Ventilator continued, requirement due to, resp failure    Ventilator Sedation RASS 0 to -2   MAJOR EVENTS/TEST RESULTS: intubated 9/6 Extubated 9/8 Re-intubated 9/9    INDWELLING DEVICES:: R IJ CVL>>9/9  MICRO DATA: MRSA PCR >>negative Urine  Blood Resp >>pendiong  ANTIMICROBIALS:  Clindamycin>>9/9    ASSESSMENT/PLAN    24 yo obese white male admitted to [place] for acute encephalopathy from acute drug abuse and  drug overdose ?suicide Patient failed trial of extubation due to increased WOB and  delerium with evidence of aspiration pneumonia  PULMONARY -Respiratory Failure-re-intubated -continue Full MV support -start Bronchodilator Therapy -Wean Fio2 and PEEP as tolerated -will perform SAT/SBt when respiratory parameters are met -aspiration pneumonia-start abx -plan for bronch this AM   CARDIOVASCULAR -continue monitoring  RENAL Follow chem 7 -follow UO  GASTROINTESTINAL GI prophylaxis  HEMATOLOGIC -Follow h/h  INFECTIOUS -signs of aspiration pneumonia   ENDOCRINE -Follow FSBS  NEUROLOGIC - intubated and sedated - minimal sedation to achieve a RASS goal: -1      I have personally obtained a history, examined the patient, evaluated laboratory and independently reviewed  imaging results, formulated the assessment and plan and placed orders.  The Patient requires high complexity decision making for assessment and support, frequent evaluation and titration of therapies, application of advanced monitoring technologies and extensive interpretation of multiple databases. Critical Care Time devoted to patient care services described in this note is 45 minutes.   Overall, patient is critically ill, prognosis is guarded.   Corrin Parker, M.D.  [facility]  Medical Director [place] Director [facility] Cardio-Pulmonary Department

## 2014-11-15 NOTE — Procedures (Signed)
Endotracheal Intubation: Patient required placement of an artificial airway secondary to resp failure.   Consent: Emergent.   Hand washing performed prior to starting the procedure.   Medications administered for sedation prior to procedure: Midazolam 4 mg IV,  Vecuronium 20 mg IV, Fentanyl 200 mcg IV.   Procedure: A time out procedure was called and correct patient, name, & ID confirmed. Needed supplies and equipment were assembled and checked to include ETT, 10 ml syringe, Glidescope, Mac and Miller blades, suction, oxygen and bag mask valve, end tidal CO2 monitor. Patient was positioned to align the mouth and pharynx to facilitate visualization of the glottis.  Heart rate, SpO2 and blood pressure was continuously monitored during the procedure. Pre-oxygenation was conducted prior to intubation and endotracheal tube was placed through the vocal cords into the trachea.  During intubation an assistant applied gentle pressure to the cricoid cartilage.   The artificial airway was placed under direct visualization via glidescope route using a 8.0 ETT on the first attempt.    ETT was secured at 24 cm mark.    Placement was confirmed by auscuitation of lungs with good breath sounds bilaterally and no stomach sounds.  Condensation was noted on endotracheal tube.  Pulse ox 96%.  CO2 detector in place with appropriate color change.   Complications: None .   Operator: Brooklyn Alfredo.   Chest radiograph ordered and pending.   Comments: OGT placed via glidescope.  Lucie Leather, M.D.  [facility]  Medical Director [facility] Medical Director [facility] Cardio-Pulmonary Department

## 2014-11-15 NOTE — Progress Notes (Signed)
Tolerating HOB at 30 without change of position.  Tube feed restarted.

## 2014-11-15 NOTE — Progress Notes (Signed)
Security alert Patient evaluated at bedside Patient extremely agitated, and get a bed, pulling IVs and acute distress with the ability to cause harm to self and others He has received multiple when necessary doses of Geodon, Ativan prior to this alert and is on a Precedex drip 0.07 mcg/kg/hr Ordered Ativan IV 2 mg, Benadryl IV 50 mg, Haldol IV 5 mg-improved symptoms Difficulty obtaining repeat IV access the patient became agitated Again require repeat dose of Ativan, he required to be held in bed by security officers during this time. Given and benzo overdose and now symptoms suggestive of withdrawal will initiate CIWA protocol, continue to follow closely

## 2014-11-15 NOTE — Progress Notes (Signed)
[facility] - [place] at [facility]   PATIENT NAME: [name]    MR#:  [id number]  DATE OF BIRTH:  18-Nov-1990  SUBJECTIVE: 24 year old male patient admitted for acute respiratory failure secondary to possible drug overdose.extubated, intubated again secondary to pneumonia. Patient right now on full vent support, fentanyl for sedation.   CHIEF COMPLAINT:   Chief Complaint  Patient presents with  . Drug Overdose    REVIEW OF SYSTEMS:    Review of Systems  Unable to perform ROS: intubated  Constitutional: Negative for fever and chills.  HENT: Negative for hearing loss.   Eyes: Negative for blurred vision, double vision and photophobia.  Respiratory: Negative for cough, hemoptysis and shortness of breath.   Cardiovascular: Negative for palpitations, orthopnea and leg swelling.  Gastrointestinal: Negative for vomiting, abdominal pain and diarrhea.  Genitourinary: Negative for dysuria and urgency.  Musculoskeletal: Negative for myalgias and neck pain.  Skin: Negative for rash.  Neurological: Negative for dizziness, focal weakness, seizures, weakness and headaches.  Psychiatric/Behavioral: Negative for memory loss. The patient does not have insomnia.     Nutrition: npo Tolerating Diet: Tolerating PT:      DRUG ALLERGIES:   Allergies  Allergen Reactions  . Sulfa Antibiotics Other (See Comments)    Reaction:  Unknown     VITALS:  Blood pressure 129/44, pulse 77, temperature 98.7 F (37.1 C), temperature source Axillary, resp. rate 18, height 5\' 8"  (1.727 m), weight 142.8 kg (314 lb 13.1 oz), SpO2 100 %.  PHYSICAL EXAMINATION:   Physical Exam  GENERAL:  24 y.o.-year-old patient lying in the bed abated, sedated.  EYES: Pupils equal, round, reactive to light.HEENT: Head atraumatic, normocephalic.orally  intubated.  NECK:  Supple, no jugular venous distention. No thyroid enlargement, no tenderness.  LUNGS: Normal breath sounds bilaterally, no  wheezing, rales,rhonchi or crepitation. No use of accessory muscles of respiration.  CARDIOVASCULAR: S1, S2 normal. No murmurs, rubs, or gallops.  ABDOMEN: Soft, nontender, nondistended. Bowel sounds present. No organomegaly or mass.  EXTREMITIES: No pedal edema, cyanosis, or clubbing.  NEUROLOGIC: Intubated, sedated. PSYCHIATRIC: intubated , sedated. SKIN: No obvious rash, lesion, or ulcer.    LABORATORY PANEL:   CBC  Recent Labs Lab 11/15/14 1059  WBC 16.5*  HGB 14.3  HCT 43.0  PLT 142*   ------------------------------------------------------------------------------------------------------------------  Chemistries   Recent Labs Lab 11/12/14 2326  NA 141  K 3.8  CL 108  CO2 27  GLUCOSE 93  BUN 15  CREATININE 0.82  CALCIUM 8.5*  MG 2.2  AST 21  ALT 28  ALKPHOS 51  BILITOT 0.7   ------------------------------------------------------------------------------------------------------------------  Cardiac Enzymes  Recent Labs Lab 11/12/14 2326  TROPONINI <0.03   ------------------------------------------------------------------------------------------------------------------  RADIOLOGY:  Dg Chest Port 1 View  11/15/2014   CLINICAL DATA:  Central line NG tube placement.  EXAM: PORTABLE CHEST - 1 VIEW  COMPARISON:  11/13/2014.  FINDINGS: Interim placement of right IJ line, its tip is in the upper portion the right atrium. Interim placement NG tube, its tip is below left hemidiaphragm. Endotracheal tube tip 12 mm above the carina . Cardiomegaly. Bilateral pulmonary alveolar infiltrates, left side greater right. Findings suggest congestive heart failure pulmonary edema. Bilateral pneumonia cannot be excluded. No pleural effusion or pneumothorax .  IMPRESSION: 1. Interim placement of right IJ line, its tip is projected over the upper right atrium. NG tube placement with tip below left hemidiaphragm. Endotracheal tube tip 12 mm above the carina. 2. Cardiomegaly with bilateral  pulmonary infiltrates  left side greater right. Findings consistent congestive heart failure pulmonary edema. Bilateral pneumonia cannot be excluded.   Electronically Signed   By: Maisie Fus  Register   On: 11/15/2014 09:17   Dg Abd Portable 1v  11/15/2014   CLINICAL DATA:  Gastric tube placement  EXAM: PORTABLE ABDOMEN - 1 VIEW  COMPARISON:  CT abdomen 09/24/2014.  Chest x-ray 11/13/2014  FINDINGS: Gastric tube is coiled in the stomach. Gas is present in the colon which is nondilated. Small bowel nondilated. Normal bowel gas pattern. No renal calculi.  IMPRESSION: Gastric tube coiled in the stomach.  Normal bowel gas pattern.   Electronically Signed   By: Marlan Palau M.D.   On: 11/15/2014 09:16     ASSESSMENT AND PLAN:   Active Problems:   Respiratory failure   Overdose   Aspiration pneumonia   1,Acute respiratory failure secondary to to pneumonia, possible aspiration.on clinda,nebs,  2. possible suicidal attempt;with xanax;ivc,psych consult, spoke with patient's father yesterday. Patient is going through  Bereavement as he lost his mother  recently and having hard time with that . 3. Bipolar disorder: restarted his psych meds   4.sitter,IVC,psych consult  Full code Condition;critical   All the records are reviewed and case discussed with Care Management/Social Workerr. Management plans discussed with the patient, family and they are in agreement.  CODE STATUS: Full  TOTAL TIME TAKING CARE OF THIS PATIENT: 35 minutes. POSSIBLE D/C IN 1-2 DAYS, DEPENDING ON CLINICAL CONDITION.   Katha Hamming M.D on 11/15/2014 at 11:50 AM  Between 7am to 6pm - Pager - [phone]  After 6pm go to [email or web address] - password [email or web address] [facility]  [place] [facility]  Office  [phone]  CC: Primary care physician; Derwood Kaplan, MD

## 2014-11-15 NOTE — Procedures (Signed)
Central Venous Catheter Placement: Indication: Patient receiving vesicant or irritant drug.; Patient receiving intravenous therapy for longer than 5 days.; Patient has limited or no vascular access.   Consent:emergent  Risks and benefits explained in detail including risk of infection, bleeding, respiratory failure and death..   Hand washing performed prior to starting the procedure.   Procedure: An active timeout was performed and correct patient, name, & ID confirmed.  After explaining risk and benefits, patient was positioned correctly for central venous access. Patient was prepped using strict sterile technique including chlorohexadine preps, sterile drape, sterile gown and sterile gloves.  The area was prepped, draped and anesthetized in the usual sterile manner. Patient comfort was obtained.  A triple lumen catheter was placed in RT  Internal Jugular Vein There was good blood return, catheter caps were placed on lumens, catheter flushed easily, the line was secured and a sterile dressing and BIO-PATCH applied.   Ultrasound was used to visualize vasculature and guidance of needle.   Number of Attempts: 1 Complications:none Estimated Blood Loss: none Chest Radiograph indicated and ordered.  Procedure time:10 mins Operator: Addalynn Kumari.   Lucie Leather, M.D.  [facility]  Medical Director [facility] Medical Director [facility] Cardio-Pulmonary Department

## 2014-11-15 NOTE — Progress Notes (Signed)
   11/15/14 1100  Urethral Catheter JL,rn Latex 16 Fr.  Placement Date/Time: 11/15/14 1000   Perineal care performed prior to insertion?: Yes  Person Inserting Catheter: JL,rn  Person Assisting with Catheter Insertion: SM, rn  Patient Location at Time of Insertion: bed  Catheter Type: Latex  Tube Size (Fr....  Indication for Insertion or Continuance of Catheter Unstable critical patients (first 24-48 hours)  Site Assessment Clean;Intact  Catheter Maintenance Bag below level of bladder;Catheter secured;Drainage bag/tubing not touching floor;Insertion date on drainage bag;No dependent loops;Seal intact;Bag emptied prior to Industrial/product designer drainage bag  Securement Method Securing device (Describe) (stat lock)  Urinary Catheter Interventions Unclamped  Foley placed per MD order, sterile precautions maintained, pt tol well, two RNs at [facility], urine return noted in tubing

## 2014-11-15 NOTE — Progress Notes (Signed)
eLink Physician-Brief Progress Note Patient Name: [name] DOB: 1990-12-01 MRN: [id number]   Date of Service  11/15/2014  HPI/Events of Note  Agitation. Unable to titrate Propofol IV infusion up d/t hypotension.   eICU Interventions  Will order: 1. 0.9 NaCl 1 liter IV over 1 hour now. 2. 0.9 NaCl IV infusion at 125 mL/hour.      Intervention Category Major Interventions: Hypotension - evaluation and management Minor Interventions: Agitation / anxiety - evaluation and management  Sommer,Steven Eugene 11/15/2014, 6:52 PM

## 2014-11-15 NOTE — Progress Notes (Addendum)
Continual movement.  Unable to tolerate HOB at 30.  Tube feeding held temporarily to avoid aspiration.

## 2014-11-15 NOTE — Progress Notes (Signed)
Patient intubated and sedated at start of shift. Per protocol if patient is intubated and sedated then no sitter required at bedside.Will re-evaluate as needed based on patient progress.

## 2014-11-15 NOTE — Procedures (Signed)
PROCEDURE: BRONCHOSCOPY Therapeutic Aspiration of Tracheobronchial Tree  PROCEDURE DATE: 11/15/2014  TIME:  NAME:  [name]  DOB:November 26, 1990  MRN: [id number] LOC:  IC15A/IC15A-AA    HOSP DAY: @ CODE STATUS:      Code Status Orders        Start     Ordered   11/13/14 0345  Full code   Continuous     11/13/14 0344          Indications/Preliminary Diagnosis: aspiration pneumonia  Consent: (Place X beside choice/s below)  The benefits, risks and possible complications of the procedure were        explained to:  ___ patient  _x__ patient's family  ___ other:___________  who verbalized understanding and gave:  ___ verbal  ___ written  _x__ verbal and written  ___ telephone  ___ other:________ consent.      Unable to obtain consent; procedure performed on emergent basis.     Other:       PRESEDATION ASSESSMENT: History and Physical has been performed. Patient meds and allergies have been reviewed. Presedation airway examination has been performed and documented. Baseline vital signs, sedation score, oxygenation status, and cardiac rhythm were reviewed. Patient was deemed to be in satisfactory condition to undergo the procedure.    PREMEDICATIONS:   Sedative/Narcotic Amt Dose   Versed  mg   Fentanyl  mcg  Diprivan  mg            PROCEDURE DETAILS: Timeout performed and correct patient, name, & ID confirmed. Following prep per Pulmonary policy, appropriate sedation was administered. The Bronchoscope was inserted in to oral cavity with bite block in place. Therapeutic aspiration of Tracheobronchial tree was performed.  Airway exam proceeded with findings, technical procedures, and specimen collection as noted below. At the end of exam the scope was withdrawn without incident. Impression and Plan as noted below.           Airway Prep (Place X beside choice below)   1% Transtracheal Lidocaine Anesthetization 7 cc   Patient prepped per  Bronchoscopy Lab Policy       Insertion Route (Place X beside choice below)   Nasal   Oral  x Endotracheal Tube   Tracheostomy   INTRAPROCEDURE MEDICATIONS:  Sedative/Narcotic Amt Dose   Versed  mg   Fentanyl  mcg  Diprivan  mg       Medication Amt Dose  Medication Amt Dose  Lidocaine 1%  cc  Epinephrine 1:10,000 sol  cc  Xylocaine 4%  cc  Cocaine  cc   TECHNICAL PROCEDURES: (Place X beside choice below)   Procedures  Description    None     Electrocautery     Cryotherapy     Balloon Dilatation     Bronchography     Stent Placement   x  Therapeutic Aspiration Extensive  amounts of purulent secretions    Laser/Argon Plasma    Brachytherapy Catheter Placement    Foreign Body Removal         SPECIMENS (Sites): (Place X beside choice below)  Specimens Description   No Specimens Obtained     Washings   x Lavage Purulent secretions in all segments of lungs   Biopsies    Fine Needle Aspirates    Brushings    Sputum    FINDINGS:  Aggressive suctioning of traheobronchial tree to remove all purulent thick secretions along with mucosal inflammation and edema ESTIMATED BLOOD LOSS: none COMPLICATIONS/RESOLUTION: none  IMPRESSION:POST-PROCEDURE DX: b/l pneumonia    RECOMMENDATION/PLAN:  -Start abx -start inhaled steroids -continue vent support     Lucie Leather, M.D.  [facility]  Medical Director [facility] Medical Director [facility] Cardio-Pulmonary Department

## 2014-11-15 NOTE — Progress Notes (Signed)
Nutrition Follow-up    INTERVENTION:   EN: received order from MD Kasa during ICU rounds to initiate TF. Recommend starting Vital High Protein at rate of 20 ml/hr with goal rate of 70 ml/hr providing 1680 kcals, 148 g of protein, 1411 mL of free water. Recommend starting free water flush at 100 mL q 4 hours; additional 600 mL free water. Constipation prevention protocol ordered per MD Kasa. Recommend checking BMP, phosphorus, magnesium in AM. Continue to assess  NUTRITION DIAGNOSIS:   Inadequate oral intake related to acute illness as evidenced by NPO status.  GOAL:   Patient will meet greater than or equal to 90% of their needs  MONITOR:    (Energy Intake, Anthropometrics, Electrolyte/Renal Profile, Digestive System)  REASON FOR ASSESSMENT:   Consult Enteral/tube feeding initiation and management  ASSESSMENT:    Pt failed trial of extubation, possible aspiration pneumonia, required reintubation this AM; s/p bronch this AM as well  Diet Order:  Diet NPO time specified  Electrolyte and Renal Profile:  Recent Labs Lab 11/12/14 2326  BUN 15  CREATININE 0.82  NA 141  K 3.8  MG 2.2   Glucose Profile: No results for input(s): GLUCAP in the last 72 hours. Nutritional Anemia Profile:  CBC Latest Ref Rng 11/15/2014 11/12/2014 09/24/2014  WBC 3.8 - 10.6 K/uL 16.5(H) 10.6 12.7(H)  Hemoglobin 13.0 - 18.0 g/dL 16.1 09.6 04.5  Hematocrit 40.0 - 52.0 % 43.0 44.6 51.7  Platelets 150 - 440 K/uL 142(L) 167 201    Meds: propofol ordered but not infusing, ativan, fentanyl, senokot  Height:   Ht Readings from Last 1 Encounters:  11/13/14  (1.727 m)    Weight:   Wt Readings from Last 1 Encounters:  11/15/14 314 lb 13.1 oz (142.8 kg)    Ideal Body Weight:   70 kg  BMI:  Body mass index is 47.88 kg/(m^2).  Estimated Nutritional Needs:   Kcal:  4098-1191 kcals   Protein:  140-175 g(2.0-2.5 g/kg) using IBW 70 kg  Fluid:  1750-2100 mL (25-30 ml/kg)   Romelle Starcher MS,  RD, LDN [phone] Pager

## 2014-11-16 ENCOUNTER — Inpatient Hospital Stay: Payer: Federal, State, Local not specified - PPO

## 2014-11-16 DIAGNOSIS — R6520 Severe sepsis without septic shock: Secondary | ICD-10-CM

## 2014-11-16 DIAGNOSIS — F319 Bipolar disorder, unspecified: Secondary | ICD-10-CM | POA: Diagnosis present

## 2014-11-16 DIAGNOSIS — A419 Sepsis, unspecified organism: Secondary | ICD-10-CM

## 2014-11-16 DIAGNOSIS — J80 Acute respiratory distress syndrome: Secondary | ICD-10-CM | POA: Diagnosis present

## 2014-11-16 DIAGNOSIS — I959 Hypotension, unspecified: Secondary | ICD-10-CM

## 2014-11-16 DIAGNOSIS — F840 Autistic disorder: Secondary | ICD-10-CM | POA: Diagnosis present

## 2014-11-16 HISTORY — DX: Acute respiratory distress syndrome: J80

## 2014-11-16 HISTORY — DX: Sepsis, unspecified organism: A41.9

## 2014-11-16 LAB — BLOOD GAS, ARTERIAL
ACID-BASE DEFICIT: 5.3 mmol/L — AB (ref 0.0–2.0)
ACID-BASE DEFICIT: 2.4 mmol/L — AB (ref 0.0–2.0)
ALLENS TEST (PASS/FAIL): POSITIVE — AB
ALLENS TEST (PASS/FAIL): POSITIVE — AB
Acid-base deficit: 3.5 mmol/L — ABNORMAL HIGH (ref 0.0–2.0)
Acid-base deficit: 3.6 mmol/L — ABNORMAL HIGH (ref 0.0–2.0)
Allens test (pass/fail): POSITIVE — AB
BICARBONATE: 26 meq/L (ref 21.0–28.0)
BICARBONATE: 23.2 meq/L (ref 21.0–28.0)
Bicarbonate: 26.1 mEq/L (ref 21.0–28.0)
Bicarbonate: 25.6 mEq/L (ref 21.0–28.0)
FIO2: 0.5
FIO2: 0.4
FIO2: 0.5
FIO2: 0.5
LHR: 26 {breaths}/min
MECHANICAL RATE: 20
MECHANICAL RATE: 26
MECHVT: 500 mL
Mechanical Rate: 15
Mechanical Rate: 35
O2 SAT: 84.1 %
O2 SAT: 88.6 %
O2 SAT: 99.3 %
O2 Saturation: 87.3 %
PATIENT TEMPERATURE: 37
PATIENT TEMPERATURE: 37
PATIENT TEMPERATURE: 37
PATIENT TEMPERATURE: 37
PCO2 ART: 64 mmHg — AB (ref 32.0–48.0)
PCO2 ART: 68 mmHg — AB (ref 32.0–48.0)
PCO2 ART: 42 mmHg (ref 32.0–48.0)
PEEP/CPAP: 5 cmH2O
PEEP: 8 cmH2O
PEEP: 10 cmH2O
PEEP: 5 cmH2O
PH ART: 7.11 — AB (ref 7.350–7.450)
PH ART: 7.35 (ref 7.350–7.450)
PO2 ART: 66 mmHg — AB (ref 83.0–108.0)
PO2 ART: 156 mmHg — AB (ref 83.0–108.0)
RATE: 15 resp/min
RATE: 20 resp/min
RATE: 35 resp/min
VT: 530 mL
VT: 450 mL
VT: 450 mL
pCO2 arterial: 82 mmHg (ref 32.0–48.0)
pH, Arterial: 7.21 — ABNORMAL LOW (ref 7.350–7.450)
pH, Arterial: 7.19 — CL (ref 7.350–7.450)
pO2, Arterial: 65 mmHg — ABNORMAL LOW (ref 83.0–108.0)
pO2, Arterial: 69 mmHg — ABNORMAL LOW (ref 83.0–108.0)

## 2014-11-16 LAB — GLUCOSE, CAPILLARY
Glucose-Capillary: 108 mg/dL — ABNORMAL HIGH (ref 65–99)
Glucose-Capillary: 109 mg/dL — ABNORMAL HIGH (ref 65–99)
Glucose-Capillary: 110 mg/dL — ABNORMAL HIGH (ref 65–99)
Glucose-Capillary: 111 mg/dL — ABNORMAL HIGH (ref 65–99)
Glucose-Capillary: 118 mg/dL — ABNORMAL HIGH (ref 65–99)
Glucose-Capillary: 127 mg/dL — ABNORMAL HIGH (ref 65–99)
Glucose-Capillary: 134 mg/dL — ABNORMAL HIGH (ref 65–99)

## 2014-11-16 LAB — BASIC METABOLIC PANEL
ANION GAP: 4 — AB (ref 5–15)
BUN: 10 mg/dL (ref 6–20)
CO2: 26 mmol/L (ref 22–32)
Calcium: 7.7 mg/dL — ABNORMAL LOW (ref 8.9–10.3)
Chloride: 117 mmol/L — ABNORMAL HIGH (ref 101–111)
Creatinine, Ser: 0.9 mg/dL (ref 0.61–1.24)
GFR calc Af Amer: >60 mL/min (ref >60)
GFR calc non Af Amer: >60 mL/min (ref >60)
GLUCOSE: 128 mg/dL — AB (ref 65–99)
POTASSIUM: 3.8 mmol/L (ref 3.5–5.1)
Sodium: 147 mmol/L — ABNORMAL HIGH (ref 135–145)

## 2014-11-16 LAB — LACTIC ACID, PLASMA
LACTIC ACID, VENOUS: 1.2 mmol/L (ref 0.5–2.0)
Lactic Acid, Venous: 1.1 mmol/L (ref 0.5–2.0)

## 2014-11-16 LAB — TYPE AND SCREEN
ABO/RH(D): O POS
ANTIBODY SCREEN: NEGATIVE

## 2014-11-16 LAB — ABO/RH: ABO/RH(D): O POS

## 2014-11-16 LAB — MAGNESIUM: Magnesium: 1.9 mg/dL (ref 1.7–2.4)

## 2014-11-16 LAB — PHOSPHORUS: Phosphorus: 4.1 mg/dL (ref 2.5–4.6)

## 2014-11-16 MED ORDER — VANCOMYCIN HCL 10 G IV SOLR
2000.0000 mg | Freq: Once | INTRAVENOUS | Status: DC
  Filled 2014-11-16: qty 2000

## 2014-11-16 MED ORDER — VECURONIUM BROMIDE 10 MG IV SOLR
10.0000 mg | INTRAVENOUS | Status: DC | PRN
  Administered 2014-11-16: 10 mg via INTRAVENOUS
  Filled 2014-11-16: qty 10

## 2014-11-16 MED ORDER — FENTANYL 2500MCG IN NS 250ML (10MCG/ML) PREMIX INFUSION
25.0000 ug/h | INTRAVENOUS | Status: DC
  Administered 2014-11-16: 250 ug/h via INTRAVENOUS
  Administered 2014-11-17 (×2): 300 ug/h via INTRAVENOUS
  Administered 2014-11-17: 50 ug/h via INTRAVENOUS
  Administered 2014-11-17: 400 ug/h via INTRAVENOUS
  Administered 2014-11-17: 300 ug/h via INTRAVENOUS
  Administered 2014-11-18: 400 ug/h via INTRAVENOUS
  Administered 2014-11-18: 50 ug/h via INTRAVENOUS
  Administered 2014-11-18: 400 ug/h via INTRAVENOUS
  Administered 2014-11-18 (×2): 50 ug/h via INTRAVENOUS
  Filled 2014-11-16 (×7): qty 250

## 2014-11-16 MED ORDER — MIDAZOLAM BOLUS VIA INFUSION
4.0000 mg | INTRAVENOUS | Status: DC | PRN
  Filled 2014-11-16: qty 4

## 2014-11-16 MED ORDER — ANTISEPTIC ORAL RINSE SOLUTION (CORINZ)
7.0000 mL | Freq: Four times a day (QID) | OROMUCOSAL | Status: DC
  Administered 2014-11-16 – 2014-11-22 (×21): 7 mL via OROMUCOSAL
  Filled 2014-11-16 (×26): qty 7

## 2014-11-16 MED ORDER — SODIUM CHLORIDE 0.9 % IV BOLUS (SEPSIS)
1000.0000 mL | INTRAVENOUS | Status: AC
  Administered 2014-11-16: 1000 mL via INTRAVENOUS

## 2014-11-16 MED ORDER — VITAL HIGH PROTEIN PO LIQD
1000.0000 mL | ORAL | Status: DC
  Administered 2014-11-16 (×4)
  Administered 2014-11-16: 1000 mL
  Administered 2014-11-16 – 2014-11-17 (×8)

## 2014-11-16 MED ORDER — CHLORHEXIDINE GLUCONATE 0.12% ORAL RINSE (MEDLINE KIT)
15.0000 mL | Freq: Two times a day (BID) | OROMUCOSAL | Status: DC
  Administered 2014-11-16 – 2014-11-22 (×12): 15 mL via OROMUCOSAL
  Filled 2014-11-16 (×14): qty 15

## 2014-11-16 MED ORDER — SODIUM CHLORIDE 0.9 % IV BOLUS (SEPSIS)
500.0000 mL | Freq: Once | INTRAVENOUS | Status: AC
  Administered 2014-11-16: 500 mL via INTRAVENOUS

## 2014-11-16 MED ORDER — VANCOMYCIN HCL 10 G IV SOLR
1250.0000 mg | Freq: Three times a day (TID) | INTRAVENOUS | Status: DC
  Administered 2014-11-17 – 2014-11-18 (×4): 1250 mg via INTRAVENOUS
  Filled 2014-11-16 (×8): qty 1250

## 2014-11-16 MED ORDER — SODIUM CHLORIDE 0.9 % IV SOLN
0.0000 mg/h | INTRAVENOUS | Status: DC
  Administered 2014-11-16: 2 mg/h via INTRAVENOUS
  Administered 2014-11-16 – 2014-11-18 (×12): 20 mg/h via INTRAVENOUS
  Administered 2014-11-18: 15 mg/h via INTRAVENOUS
  Administered 2014-11-18 (×2): 20 mg/h via INTRAVENOUS
  Filled 2014-11-16 (×17): qty 10

## 2014-11-16 MED ORDER — VANCOMYCIN HCL 10 G IV SOLR
2000.0000 mg | Freq: Once | INTRAVENOUS | Status: AC
  Administered 2014-11-16: 2000 mg via INTRAVENOUS
  Filled 2014-11-16: qty 2000

## 2014-11-16 MED ORDER — MIDAZOLAM HCL 2 MG/2ML IJ SOLN
4.0000 mg | INTRAMUSCULAR | Status: DC | PRN
Start: 2014-11-16 — End: 2014-11-18
  Administered 2014-11-16 – 2014-11-18 (×10): 4 mg via INTRAVENOUS
  Filled 2014-11-16 (×11): qty 4

## 2014-11-16 MED ORDER — DOCUSATE SODIUM 100 MG PO CAPS: 100.0000 mg | ORAL_CAPSULE | Freq: Two times a day (BID) | ORAL | Status: DC | PRN

## 2014-11-16 MED ORDER — FENTANYL BOLUS VIA INFUSION
50.0000 ug | INTRAVENOUS | Status: DC | PRN
Start: 2014-11-16 — End: 2014-11-19
  Administered 2014-11-17 (×4): 50 ug via INTRAVENOUS
  Filled 2014-11-16: qty 50

## 2014-11-16 MED ORDER — PRO-STAT SUGAR FREE PO LIQD
30.0000 mL | Freq: Four times a day (QID) | ORAL | Status: DC
  Administered 2014-11-16 – 2014-11-21 (×19): 30 mL via ORAL

## 2014-11-16 MED ORDER — SODIUM CHLORIDE 0.9 % IJ SOLN
INTRAMUSCULAR | Status: AC
  Administered 2014-11-16: 10:00:00
  Filled 2014-11-16: qty 10

## 2014-11-16 MED ORDER — SODIUM CHLORIDE 0.9 % IV BOLUS (SEPSIS)
500.0000 mL | INTRAVENOUS | Status: AC
  Administered 2014-11-16: 500 mL via INTRAVENOUS

## 2014-11-16 MED ORDER — MIDAZOLAM BOLUS VIA INFUSION
1.0000 mg | INTRAVENOUS | Status: DC | PRN
  Filled 2014-11-16: qty 2

## 2014-11-16 MED ORDER — STERILE WATER FOR INJECTION IJ SOLN
INTRAMUSCULAR | Status: AC
  Administered 2014-11-16: 10 mL
  Filled 2014-11-16: qty 10

## 2014-11-16 MED ORDER — FENTANYL CITRATE (PF) 100 MCG/2ML IJ SOLN
50.0000 ug | Freq: Once | INTRAMUSCULAR | Status: AC
  Administered 2014-11-16: 50 ug via INTRAVENOUS
  Filled 2014-11-16: qty 2

## 2014-11-16 MED ORDER — SENNOSIDES-DOCUSATE SODIUM 8.6-50 MG PO TABS
1.0000 | ORAL_TABLET | Freq: Two times a day (BID) | ORAL | Status: DC
  Administered 2014-11-16 – 2014-11-18 (×5): 1 via ORAL
  Filled 2014-11-16 (×5): qty 1

## 2014-11-16 MED ORDER — PIPERACILLIN-TAZOBACTAM 3.375 G IVPB
3.3750 g | Freq: Three times a day (TID) | INTRAVENOUS | Status: DC
  Administered 2014-11-16 – 2014-11-18 (×5): 3.375 g via INTRAVENOUS
  Filled 2014-11-16 (×9): qty 50

## 2014-11-16 MED ORDER — VECURONIUM BROMIDE 10 MG IV SOLR: 10.0000 mg | INTRAVENOUS | Status: DC | PRN

## 2014-11-16 NOTE — Progress Notes (Signed)
[facility] - [place] at [facility]   PATIENT NAME: [name]    MR#:  [id number]  DATE OF BIRTH:  1990-11-27  SUBJECTIVE: 24 year old male patient admitted for acute respiratory failure secondary to possible drug overdose.extubated, intubated again secondary to pneumonia. Patient right now on full vent support, tachycardic this morning   CHIEF COMPLAINT:   Chief Complaint  Patient presents with  . Drug Overdose    REVIEW OF SYSTEMS:    Review of Systems  Unable to perform ROS: intubated  Constitutional: Negative for fever and chills.  HENT: Negative for hearing loss.   Eyes: Negative for blurred vision, double vision and photophobia.  Respiratory: Negative for cough, hemoptysis and shortness of breath.   Cardiovascular: Negative for palpitations, orthopnea and leg swelling.  Gastrointestinal: Negative for vomiting, abdominal pain and diarrhea.  Genitourinary: Negative for dysuria and urgency.  Musculoskeletal: Negative for myalgias and neck pain.  Skin: Negative for rash.  Neurological: Negative for dizziness, focal weakness, seizures, weakness and headaches.  Psychiatric/Behavioral: Negative for memory loss. The patient does not have insomnia.     Nutrition: npo Tolerating Diet: Tolerating PT:      DRUG ALLERGIES:   Allergies  Allergen Reactions  . Sulfa Antibiotics Other (See Comments)    Reaction:  Unknown     VITALS:  Blood pressure 121/55, pulse 125, temperature 99.1 F (37.3 C), temperature source Axillary, resp. rate 12, height 5\' 8"  (1.727 m), weight 144.4 kg (318 lb 5.5 oz), SpO2 97 %.  PHYSICAL EXAMINATION:   Physical Exam  GENERAL:  24 y.o.-year-old patient lying in the bed abated, sedated.  EYES: Pupils equal, round, reactive to light.HEENT: Head atraumatic, normocephalic.orally  intubated.  NECK:  Supple, no jugular venous distention. No thyroid enlargement, no tenderness.  LUNGS: Normal breath sounds bilaterally, no  wheezing, rales,rhonchi or crepitation. No use of accessory muscles of respiration.  CARDIOVASCULAR: S1, S2 normal. No murmurs, rubs, or gallops.  ABDOMEN: Soft, nontender, nondistended. Bowel sounds present. No organomegaly or mass.  EXTREMITIES: No pedal edema, cyanosis, or clubbing.  NEUROLOGIC: Intubated, sedated. PSYCHIATRIC: intubated , sedated. SKIN: No obvious rash, lesion, or ulcer.    LABORATORY PANEL:   CBC  Recent Labs Lab 11/15/14 1059  WBC 16.5*  HGB 14.3  HCT 43.0  PLT 142*   ------------------------------------------------------------------------------------------------------------------  Chemistries   Recent Labs Lab 11/15/14 1059 11/16/14 0512  NA 144 147*  K 4.1 3.8  CL 114* 117*  CO2 25 26  GLUCOSE 107* 128*  BUN 7 10  CREATININE 0.67 0.90  CALCIUM 8.1* 7.7*  MG  --  1.9  AST 26  --   ALT 25  --   ALKPHOS 48  --   BILITOT 1.0  --    ------------------------------------------------------------------------------------------------------------------  Cardiac Enzymes  Recent Labs Lab 11/12/14 2326  TROPONINI <0.03   ------------------------------------------------------------------------------------------------------------------  RADIOLOGY:  Dg Chest Port 1 View  11/15/2014   CLINICAL DATA:  Central line NG tube placement.  EXAM: PORTABLE CHEST - 1 VIEW  COMPARISON:  11/13/2014.  FINDINGS: Interim placement of right IJ line, its tip is in the upper portion the right atrium. Interim placement NG tube, its tip is below left hemidiaphragm. Endotracheal tube tip 12 mm above the carina . Cardiomegaly. Bilateral pulmonary alveolar infiltrates, left side greater right. Findings suggest congestive heart failure pulmonary edema. Bilateral pneumonia cannot be excluded. No pleural effusion or pneumothorax .  IMPRESSION: 1. Interim placement of right IJ line, its tip is projected over the upper  right atrium. NG tube placement with tip below left hemidiaphragm.  Endotracheal tube tip 12 mm above the carina. 2. Cardiomegaly with bilateral pulmonary infiltrates left side greater right. Findings consistent congestive heart failure pulmonary edema. Bilateral pneumonia cannot be excluded.   Electronically Signed   By: Maisie Fus  Register   On: 11/15/2014 09:17   Dg Abd Portable 1v  11/15/2014   CLINICAL DATA:  Gastric tube placement  EXAM: PORTABLE ABDOMEN - 1 VIEW  COMPARISON:  CT abdomen 09/24/2014.  Chest x-ray 11/13/2014  FINDINGS: Gastric tube is coiled in the stomach. Gas is present in the colon which is nondilated. Small bowel nondilated. Normal bowel gas pattern. No renal calculi.  IMPRESSION: Gastric tube coiled in the stomach.  Normal bowel gas pattern.   Electronically Signed   By: Marlan Palau M.D.   On: 11/15/2014 09:16     ASSESSMENT AND PLAN:   Active Problems:   Respiratory failure   Overdose   Aspiration pneumonia   1,Acute respiratory failure secondary to to pneumonia, possible aspiration.on clinda,nebs, Hypotension secondary to sedation: Continue IV fluids. 2. possible suicidal attempt;with xanax;ivc,psych consult, spoke with patient's father yesterday. Patient is going through  Bereavement as he lost his mother  recently and having hard time with that . 3. Bipolar disorder we will restart them after extubation.  Sitter, psych consult after extubation. Has autism, bipolar disorder, PTSD. With his recent loss of the mother patient is more depressed. Dad is concerned that he may have taken in an attempted suicide, took Xanax 2 mg tablets 12 of them. Full code Condition;critical   All the records are reviewed and case discussed with Care Management/Social Workerr. Management plans discussed with the patient, family and they are in agreement.  CODE STATUS: Full  TOTAL TIME TAKING CARE OF THIS PATIENT: 35 minutes. POSSIBLE D/C IN 1-2 DAYS, DEPENDING ON CLINICAL CONDITION.   Katha Hamming M.D on 11/16/2014 at 9:40 AM  Between  7am to 6pm - Pager - [phone]  After 6pm go to [email or web address] - password [email or web address] [facility]  [place] [facility]  Office  [phone]  CC: Primary care physician; Derwood Kaplan, MD

## 2014-11-16 NOTE — Consult Note (Addendum)
[facility] Consultation      Name: [name] MRN: [id number] DOB: Aug 08, 1990    ADMISSION DATE:  11/12/2014   CHIEF COMPLAINT:   Acute mental status changes, resp failure   SUBJECTIVE  Reintubated yesterday, bronch 9/9. Overnight with some periods of agitation, propofol increased, family at bedside. Abnormal breathing noted (abdominal-thorco)   Significant events: 9/7>>found down, obtunded, intubated in [facility] ED 9/8>>extubated 9/9>>reintubated for respiratory distress, CVL placed, bronch with copious secretions   PAST MEDICAL HISTORY    :  Past Medical History  Diagnosis Date  . Thyroid disease    History reviewed. No pertinent past surgical history. Prior to Admission medications   Medication Sig Start Date End Date Taking? Authorizing Provider  citalopram (CELEXA) 40 MG tablet Take 40 mg by mouth daily.    Historical Provider, MD  esomeprazole (NEXIUM) 40 MG capsule Take 40 mg by mouth daily at 12 noon.    Historical Provider, MD  ketorolac (TORADOL) 10 MG tablet Take 1 tablet (10 mg total) by mouth every 8 (eight) hours as needed. 09/24/14   Gregor Hams, MD  lamoTRIgine (LAMICTAL) 150 MG tablet Take 150 mg by mouth 2 (two) times daily.    Historical Provider, MD  levothyroxine (SYNTHROID, LEVOTHROID) 25 MCG tablet Take 25 mcg by mouth daily before breakfast.    Historical Provider, MD  naltrexone (DEPADE) 50 MG tablet Take 25 mg by mouth daily.    Historical Provider, MD  oxyCODONE-acetaminophen (PERCOCET) 10-325 MG per tablet Take 1 tablet by mouth every 6 (six) hours as needed for pain. 10/01/14 10/01/15  Gregor Hams, MD   Allergies  Allergen Reactions  . Sulfa Antibiotics Other (See Comments)    Reaction:  Unknown      FAMILY HISTORY   No family history on file.    SOCIAL HISTORY    reports that he has been smoking Cigarettes.  He has been smoking about 1.00 pack per day. He does not have any smokeless tobacco history on  file. He reports that he does not drink alcohol. His drug history is not on file.  Review of Systems  Unable to perform ROS: critical illness      VITAL SIGNS    Temp:  [98.4 F (36.9 C)-99.1 F (37.3 C)] 99.1 F (37.3 C) (09/10 0800) Pulse Rate:  [71-133] 125 (09/10 0900) Resp:  [10-21] 12 (09/10 0900) BP: (97-138)/(39-74) 121/55 mmHg (09/10 0900) SpO2:  [94 %-100 %] 97 % (09/10 0900) FiO2 (%):  [40 %-100 %] 40 % (09/10 0804) Weight:  [318 lb 5.5 oz (144.4 kg)] 318 lb 5.5 oz (144.4 kg) (09/10 0500) HEMODYNAMICS:   VENTILATOR SETTINGS: Vent Mode:  [-] PRVC FiO2 (%):  [40 %-100 %] 40 % Set Rate:  [15 bmp-19 bmp] 15 bmp Vt Set:  [500 mL] 500 mL PEEP:  [5 cmH20] 5 cmH20 INTAKE / OUTPUT:  Intake/Output Summary (Last 24 hours) at 11/16/14 0941 Last data filed at 11/16/14 0804  Gross per 24 hour  Intake 1323.76 ml  Output   2925 ml  Net -1601.24 ml       PHYSICAL EXAM   Physical Exam  Constitutional: He appears distressed.  HENT:  Head: Normocephalic and atraumatic.  Eyes: Pupils are equal, round, and reactive to light. No scleral icterus.  Neck: Normal range of motion. Neck supple.  Cardiovascular: Normal rate and regular rhythm.   No murmur heard. Pulmonary/Chest: He is in respiratory distress. He has no wheezes. He has rales.  Abdominal breathing noted  Abdominal: Soft. He exhibits no distension. There is no tenderness.  Musculoskeletal: He exhibits no edema.  Neurological: He displays normal reflexes. Coordination normal.  gcs<8T  Skin: Skin is warm. No rash noted. He is diaphoretic.       LABS   LABS:  CBC  Recent Labs Lab 11/12/14 2326 11/15/14 1059  WBC 10.6 16.5*  HGB 15.0 14.3  HCT 44.6 43.0  PLT 167 142*   Coag's No results for input(s): APTT, INR in the last 168 hours. BMET  Recent Labs Lab 11/12/14 2326 11/15/14 1059 11/16/14 0512  NA 141 144 147*  K 3.8 4.1 3.8  CL 108 114* 117*  CO2 '27 25 26  ' BUN '15 7 10  ' CREATININE  0.82 0.67 0.90  GLUCOSE 93 107* 128*   Electrolytes  Recent Labs Lab 11/12/14 2326 11/15/14 1059 11/16/14 0512  CALCIUM 8.5* 8.1* 7.7*  MG 2.2  --  1.9  PHOS  --  4.1 4.1   Sepsis Markers  Recent Labs Lab 11/12/14 2343  LATICACIDVEN 0.7   ABG  Recent Labs Lab 11/13/14 0226 11/15/14 1100  PHART 7.26* 7.31*  PCO2ART 56* 44  PO2ART 68* 228*   Liver Enzymes  Recent Labs Lab 11/12/14 2326 11/15/14 1059  AST 21 26  ALT 28 25  ALKPHOS 51 48  BILITOT 0.7 1.0  ALBUMIN 3.8 3.2*   Cardiac Enzymes  Recent Labs Lab 11/12/14 2326  TROPONINI <0.03   Glucose  Recent Labs Lab 11/15/14 1951 11/15/14 2054 11/16/14 0004 11/16/14 0359 11/16/14 0618 11/16/14 0748  GLUCAP 107* 114* 127* 111* 109* 110*     Recent Results (from the past 240 hour(s))  MRSA PCR Screening     Status: None   Collection Time: 11/13/14  3:40 AM  Result Value Ref Range Status   MRSA by PCR NEGATIVE NEGATIVE Final    Comment:        The GeneXpert MRSA Assay (FDA approved for NASAL specimens only), is one component of a comprehensive MRSA colonization surveillance program. It is not intended to diagnose MRSA infection nor to guide or monitor treatment for MRSA infections.      Current facility-administered medications:  .  0.9 %  sodium chloride infusion, , Intravenous, Continuous, Iyonnah Ferrante, MD, Last Rate: 125 mL/hr at 11/15/14 2106, 125 mL/hr at 11/15/14 2106 .  acetaminophen (TYLENOL) tablet 650 mg, 650 mg, Oral, Q6H PRN **OR** acetaminophen (TYLENOL) suppository 650 mg, 650 mg, Rectal, Q6H PRN, Harrie Foreman, MD .  antiseptic oral rinse solution (CORINZ), 7 mL, Mouth Rinse, QID, Harrie Foreman, MD, 7 mL at 11/16/14 0354 .  budesonide (PULMICORT) nebulizer solution 0.5 mg, 0.5 mg, Nebulization, BID, Flora Lipps, MD, 0.5 mg at 11/16/14 0804 .  chlorhexidine gluconate (PERIDEX) 0.12 % solution 15 mL, 15 mL, Mouth Rinse, BID, Harrie Foreman, MD, 15 mL at 11/16/14  0755 .  clindamycin (CLEOCIN) IVPB 600 mg, 600 mg, Intravenous, 3 times per day, Flora Lipps, MD, 600 mg at 11/16/14 0509 .  feeding supplement (VITAL HIGH PROTEIN) liquid 1,000 mL, 1,000 mL, Per Tube, Q24H, Flora Lipps, MD .  fentaNYL (SUBLIMAZE) bolus via infusion 50-100 mcg, 50-100 mcg, Intravenous, Q1H PRN, Flora Lipps, MD, 100 mcg at 11/15/14 2100 .  fentaNYL 2576mg in NS 2517m(1076mml) infusion-PREMIX, 50-400 mcg/hr, Intravenous, Continuous, KurFlora LippsD, Last Rate: 25 mL/hr at 11/16/14 0929, 250 mcg/hr at 11/16/14 0929 .  free water 100 mL, 100 mL, Per Tube, 6 times per  day, Flora Lipps, MD, 100 mL at 11/16/14 0751 .  heparin injection 5,000 Units, 5,000 Units, Subcutaneous, 3 times per day, Harrie Foreman, MD, 5,000 Units at 11/16/14 0509 .  Influenza vac split quadrivalent PF (FLUARIX) injection 0.5 mL, 0.5 mL, Intramuscular, Tomorrow-1000, Harrie Foreman, MD .  ipratropium-albuterol (DUONEB) 0.5-2.5 (3) MG/3ML nebulizer solution 3 mL, 3 mL, Nebulization, Q4H, Flora Lipps, MD, 3 mL at 11/16/14 0804 .  levothyroxine (SYNTHROID, LEVOTHROID) injection 37.5 mcg, 37.5 mcg, Intravenous, Daily, Flora Lipps, MD, 37.5 mcg at 11/16/14 0928 .  midazolam (VERSED) injection 2-4 mg, 2-4 mg, Intravenous, Q30 min PRN, Flora Lipps, MD, 2 mg at 11/16/14 0844 .  morphine 2 MG/ML injection 2 mg, 2 mg, Intravenous, Q4H PRN, Harrie Foreman, MD .  pantoprazole (PROTONIX) injection 40 mg, 40 mg, Intravenous, Q24H, Colbert Coyer, MD, 40 mg at 11/16/14 0355 .  propofol (DIPRIVAN) 1000 MG/100ML infusion, 5-80 mcg/kg/min, Intravenous, Titrated, Flora Lipps, MD, Last Rate: 34.3 mL/hr at 11/16/14 0940, 40 mcg/kg/min at 11/16/14 0940 .  senna-docusate (Senokot-S) tablet 1 tablet, 1 tablet, Oral, BID PRN, Epifanio Lesches, MD .  sodium chloride 0.9 % injection, , , ,  .  sterile water (preservative free) injection, , , ,  .  vecuronium (NORCURON) injection 10 mg, 10 mg, Intravenous, Q1H PRN,  Tangy Drozdowski, MD, 10 mg at 11/16/14 0929 .  ziprasidone (GEODON) injection 20 mg, 20 mg, Intramuscular, BID PRN, Flora Lipps, MD, 20 mg at 11/14/14 2129  IMAGING    No results found.    Indwelling Urinary Catheter continued, requirement due to   Reason to continue Indwelling Urinary Catheter for strict Intake/Output monitoring for hemodynamic instability         Ventilator continued, requirement due to, resp failure    Ventilator Sedation RASS 0 to -2    INDWELLING DEVICES:: R IJ CVL>>9/9  MICRO DATA: MRSA PCR >>negative Urine  Blood Resp >>pending  ANTIMICROBIALS:  Clindamycin>>9/9    ASSESSMENT/PLAN    24 yo obese white male admitted to ICU for acute encephalopathy from acute drug abuse and  drug overdose ?suicide Patient failed trial of extubation due to increased WOB and delerium with evidence of aspiration pneumonia  PULMONARY -Respiratory Failure-re-intubated -continue Full MV support -on PRN vec today for vent dysynchrony/tachypnea/abnormal breathing.  -start Bronchodilator Therapy -Wean Fio2 and PEEP as tolerated -will perform SAT/SBt when respiratory parameters are met -aspiration pneumonia-start abx -s/p bronch 9/9>>thick, purulent secretion throughout.    CARDIOVASCULAR -continue monitoring  RENAL Follow chem 7 -follow UO  GASTROINTESTINAL GI prophylaxis  HEMATOLOGIC -Follow h/h  INFECTIOUS - aspiration pneumonia - currently on clindamycin   ENDOCRINE -Follow FSBS  NEUROLOGIC - intubated and sedated - minimal sedation to achieve a RASS goal: -1    I have personally obtained a history, examined the patient, evaluated laboratory and independently reviewed  imaging results, formulated the assessment and plan and placed orders.  The Patient requires high complexity decision making for assessment and support, frequent evaluation and titration of therapies, application of advanced monitoring technologies and extensive  interpretation of multiple databases. Critical Care Time devoted to patient care services described in this note is 45 minutes.   Overall, patient is critically ill, prognosis is guarded.  Vilinda Boehringer, MD [facility] Pager [phone] (please enter 7-digits) On Call Pager - [phone] (please enter 7-digits)

## 2014-11-16 NOTE — Progress Notes (Signed)
ANTIBIOTIC CONSULT NOTE - INITIAL  Pharmacy Consult for Vancomycin and Zosyn Indication: pneumonia/sepsis  Allergies  Allergen Reactions  . Sulfa Antibiotics Other (See Comments)    Reaction:  Unknown     Patient Measurements: Height:  (180.3 cm) Weight: (!) 318 lb 5.5 oz (144.4 kg) IBW/kg (Calculated) : 75.3 Adjusted Body Weight: 103kg  Vital Signs: Temp: 97.7 F (36.5 C) (09/10 1200) Temp Source: Oral (09/10 1200) BP: 96/47 mmHg (09/10 1630) Pulse Rate: 115 (09/10 1713) Intake/Output from previous day: 09/09 0701 - 09/10 0700 In: 1734.8 [I.V.:1493.2; NG/GT:191.7; IV Piggyback:50] Out: 2650 [Urine:2650] Intake/Output from this shift: Total I/O In: 834.6 [I.V.:638.6; NG/GT:196] Out: 1075 [Urine:1075]  Labs:  Recent Labs  11/15/14 1059 11/16/14 0512  WBC 16.5*  --   HGB 14.3  --   PLT 142*  --   CREATININE 0.67 0.90   Estimated Creatinine Clearance: 184.2 mL/min (by C-G formula based on Cr of 0.9). No results for input(s): VANCOTROUGH, VANCOPEAK, VANCORANDOM, GENTTROUGH, GENTPEAK, GENTRANDOM, TOBRATROUGH, TOBRAPEAK, TOBRARND, AMIKACINPEAK, AMIKACINTROU, AMIKACIN in the last 72 hours.   Microbiology: Recent Results (from the past 720 hour(s))  MRSA PCR Screening     Status: None   Collection Time: 11/13/14  3:40 AM  Result Value Ref Range Status   MRSA by PCR NEGATIVE NEGATIVE Final    Comment:        The GeneXpert MRSA Assay (FDA approved for NASAL specimens only), is one component of a comprehensive MRSA colonization surveillance program. It is not intended to diagnose MRSA infection nor to guide or monitor treatment for MRSA infections.   Culture, bal-quantitative     Status: None (Preliminary result)   Collection Time: 11/15/14  9:53 AM  Result Value Ref Range Status   Specimen Description BRONCHIAL ALVEOLAR LAVAGE  Final   Special Requests NONE  Final   Gram Stain   Final    MANY WBC SEEN FEW GRAM POSITIVE COCCI RARE GRAM POSITIVE  RODS EXCELLENT SPECIMEN - 90-100% WBCS    Culture   Final    MODERATE GROWTH STAPHYLOCOCCUS AUREUS SUSCEPTIBILITIES TO FOLLOW    Report Status PENDING  Incomplete    Medical History: Past Medical History  Diagnosis Date  . Thyroid disease     Medications:  Prescriptions prior to admission  Medication Sig Dispense Refill Last Dose  . ALPRAZolam (XANAX) 1 MG tablet Take 1 mg by mouth 2 (two) times daily as needed for anxiety.   PRN at PRN  . citalopram (CELEXA) 40 MG tablet Take 40 mg by mouth daily.   unknown at unknown  . esomeprazole (NEXIUM) 40 MG capsule Take 40 mg by mouth daily.    unknown at unknown  . ketorolac (TORADOL) 10 MG tablet Take 1 tablet (10 mg total) by mouth every 8 (eight) hours as needed. (Patient taking differently: Take 10 mg by mouth every 8 (eight) hours as needed for moderate pain. ) 20 tablet 0 PRN at PRN  . lamoTRIgine (LAMICTAL) 150 MG tablet Take 150 mg by mouth 2 (two) times daily.   unknown at unknown  . levothyroxine (SYNTHROID, LEVOTHROID) 75 MCG tablet Take 75 mcg by mouth daily before breakfast.   unknown at unknown  . naltrexone (DEPADE) 50 MG tablet Take 50 mg by mouth 2 (two) times daily.    unknown at unknown  . ondansetron (ZOFRAN) 4 MG tablet Take 4 mg by mouth every 8 (eight) hours as needed for nausea or vomiting.   PRN at PRN  . oxyCODONE-acetaminophen (  PERCOCET) 10-325 MG per tablet Take 1 tablet by mouth every 6 (six) hours as needed for pain. 20 tablet 0 PRN at PRN  . promethazine (PHENERGAN) 25 MG suppository Place 25 mg rectally every 6 (six) hours as needed for nausea or vomiting.   PRN at PRN  . rizatriptan (MAXALT) 10 MG tablet Take 10 mg by mouth as needed for migraine. May repeat in 2 hours if needed   PRN at PRN   Assessment: 24 yo male being treated for sepsis and possible pneumonia, with pos staph aureus cultures.  Goal of Therapy:  Vancomycin trough level 15-20 mcg/ml  Plan:  TBW: 144kg IBW: 75.3kg  DW: 103kg Ke: 0.099   T1/2:7 hrs   Vd: 72.1L  Will give vancomycin LD of 2000mg  followed by vancomycin 1250mg  IV Q8H.  Check trough level prior to the 5th dose.   Will also start zosyn 3.375g IV Q8H given over 4 hours EI infusion.  Continue to monitor renal function.   Jacqualyn Posey 11/16/2014,5:20 PM

## 2014-11-16 NOTE — Progress Notes (Addendum)
eLink Physician-Brief Progress Note Patient Name: [name] DOB: [birth year] MRN: [id number]   Date of Service  11/16/2014  HPI/Events of Note  I updated pts father over the camera.  He states pt is autistic and bipolar and is under the care of [facility] in [place] with Dr Stevphen Rochester Su. Pt is on lamictal  bid and citalopram 40/d and prn xanax and has been using THC .   Principal Problem:   Acute respiratory failure with hypoxia Active Problems:   Overdose   Aspiration pneumonia   Autism   Bipolar 1 disorder   ARDS (adult respiratory distress syndrome)   Severe sepsis with acute organ dysfunction    eICU Interventions  Above info important regards to ards protocol and sedation issues and future management      Intervention Category Major Interventions: Other:  Shan Levans 11/16/2014, 10:43 PM

## 2014-11-16 NOTE — Progress Notes (Signed)
Dr. Delford Field currently managing pt sepsis protocol and ARDS protocol ordered report given to RN Ortencia Kick

## 2014-11-16 NOTE — Progress Notes (Signed)
eLink Physician-Brief Progress Note Patient Name: [name] DOB: May 21, 1990 MRN: [id number]   Date of Service  11/16/2014  HPI/Events of Note  Pt with ARDS on CXR, hypotension, evident septic shock state.   Aspiration pna but on cleocin alone  eICU Interventions  Start: septic shock protocol, ards protocol, sedation protocol, expand abx to vanco/zosyn. D/c cleocin.  See orders .      Intervention Category Major Interventions: Shock - evaluation and management;Sepsis - evaluation and management;Respiratory failure - evaluation and management  Shan Levans 11/16/2014, 5:04 PM

## 2014-11-16 NOTE — Progress Notes (Signed)
PHARMACY - CRITICAL CARE PROGRESS NOTE  Pharmacy Consult for Constipation Prevention    Allergies  Allergen Reactions  . Sulfa Antibiotics Other (See Comments)    Reaction:  Unknown     Patient Measurements: Height:  (180.3 cm) Weight: (!) 318 lb 5.5 oz (144.4 kg) IBW/kg (Calculated) : 75.3  Vital Signs: Temp: 99.1 F (37.3 C) (09/10 0800) BP: 108/55 mmHg (09/10 1000) Pulse Rate: 119 (09/10 1000) Intake/Output from previous day: 09/09 0701 - 09/10 0700 In: 1734.8 [I.V.:1493.2; NG/GT:191.7; IV Piggyback:50] Out: 2650 [Urine:2650] Intake/Output from this shift: Total I/O In: 290 [I.V.:170; NG/GT:120] Out: 275 [Urine:275] Vent settings for last 24 hours: Vent Mode:  [-] PRVC FiO2 (%):  [40 %-60 %] 40 % Set Rate:  [15 bmp-19 bmp] 15 bmp Vt Set:  [500 mL] 500 mL PEEP:  [5 cmH20] 5 cmH20  Labs:  Recent Labs  11/15/14 1059 11/16/14 0512  WBC 16.5*  --   HGB 14.3  --   HCT 43.0  --   PLT 142*  --   CREATININE 0.67 0.90  MG  --  1.9  PHOS 4.1 4.1  ALBUMIN 3.2*  --   PROT 5.9*  --   AST 26  --   ALT 25  --   ALKPHOS 48  --   BILITOT 1.0  --    Estimated Creatinine Clearance: 184.2 mL/min (by C-G formula based on Cr of 0.9).   Recent Labs  11/16/14 0359 11/16/14 0618 11/16/14 0748  GLUCAP 111* 109* 110*    Microbiology: Recent Results (from the past 720 hour(s))  MRSA PCR Screening     Status: None   Collection Time: 11/13/14  3:40 AM  Result Value Ref Range Status   MRSA by PCR NEGATIVE NEGATIVE Final    Comment:        The GeneXpert MRSA Assay (FDA approved for NASAL specimens only), is one component of a comprehensive MRSA colonization surveillance program. It is not intended to diagnose MRSA infection nor to guide or monitor treatment for MRSA infections.     Medications:  Scheduled:  . antiseptic oral rinse  7 mL Mouth Rinse QID  . budesonide (PULMICORT) nebulizer solution  0.5 mg Nebulization BID  . chlorhexidine gluconate  15  mL Mouth Rinse BID  . clindamycin (CLEOCIN) IV  600 mg Intravenous 3 times per day  . feeding supplement (VITAL HIGH PROTEIN)  1,000 mL Per Tube Q24H  . free water  100 mL Per Tube 6 times per day  . heparin  5,000 Units Subcutaneous 3 times per day  . Influenza vac split quadrivalent PF  0.5 mL Intramuscular Tomorrow-1000  . ipratropium-albuterol  3 mL Nebulization Q4H  . levothyroxine  37.5 mcg Intravenous Daily  . pantoprazole (PROTONIX) IV  40 mg Intravenous Q24H  . senna-docusate  1 tablet Oral BID   Infusions:  . sodium chloride 75 mL/hr at 11/16/14 0942  . fentaNYL infusion INTRAVENOUS 250 mcg/hr (11/16/14 0929)  . propofol (DIPRIVAN) infusion 40 mcg/kg/min (11/16/14 1023)   PRN: acetaminophen **OR** acetaminophen, fentaNYL, midazolam, morphine injection, vecuronium, ziprasidone  Assessment: [name] with acute respiratory failure currently intubated and sedated on propofol and fentanyl.   Plan:  Patient has not had a BM to date this admission so change order senna/docusate from 1 tablet po bid prn to 1 tablet PO BID  and f/u AM.   Demetrius Charity, PharmD

## 2014-11-16 NOTE — Progress Notes (Signed)
Nutrition Follow-up   INTERVENTION:   EN: Recommend new goal rate of Vital High Protein at 59mL/hr with Propofol infusing (providing 906kcals in 24 hours) with Prostat QID. New goal rate of TF with Prostat will provide 1978kcals and 119g protein (85% of estimated protein needs) and of free water including flushes. Will monitor sodium and UOP with IVF running and adjust free water accordingly. Will continue to follow.   NUTRITION DIAGNOSIS:   Inadequate oral intake related to acute illness as evidenced by NPO status, being addressed with TF  GOAL:   Patient will meet greater than or equal to 90% of their needs; ongoing  MONITOR:    (Energy Intake, Anthropometrics, Electrolyte/Renal Profile, Digestive System)   ASSESSMENT:    Pt remains intubated on the vent this am.  Diet Order:  Diet NPO time specified    Current Nutrition: Pt remains on Vital High Protein at 24mL/hr this am with flushes of q4hours, tolerating well per RN Annabelle Harman   Gastrointestinal Profile: Last BM: no BM since admission (day 4)   Medications: NS at 41mL/hr, protonix, senokot, fentanyl, Propofol (providing 906kcals in 24 hours)  Electrolyte/Renal Profile and Glucose Profile:   Recent Labs Lab 11/12/14 2326 11/15/14 1059 11/16/14 0512  NA 141 144 147*  K 3.8 4.1 3.8  CL 108 114* 117*  CO2 BUN CREATININE 0.82 0.67 0.90  CALCIUM 8.5* 8.1* 7.7*  MG 2.2  --  1.9  PHOS  --  4.1 4.1  GLUCOSE 93 107* 128*   Protein Profile:  Recent Labs Lab 11/12/14 2326 11/15/14 1059  ALBUMIN 3.8 3.2*     Weight Trend since Admission: Filed Weights   11/14/14 0541 11/15/14 0435 11/16/14 0500  Weight: 322 lb 8.5 oz (146.3 kg) 314 lb 13.1 oz (142.8 kg) 318 lb 5.5 oz (144.4 kg)    BMI:  Body mass index is 44.42 kg/(m^2).  Estimated Nutritional Needs:   Kcal:  1571-1999 kcals   Protein:  140-175 g(2.0-2.5 g/kg) using IBW 70 kg  Fluid:  1750-2100 mL (25-30 ml/kg)     HIGH Care Level  Leda Quail, RD, LDN Pager [phone]

## 2014-11-16 NOTE — Progress Notes (Addendum)
Entered in error

## 2014-11-17 ENCOUNTER — Inpatient Hospital Stay: Payer: Federal, State, Local not specified - PPO

## 2014-11-17 DIAGNOSIS — J9601 Acute respiratory failure with hypoxia: Secondary | ICD-10-CM

## 2014-11-17 DIAGNOSIS — R6520 Severe sepsis without septic shock: Secondary | ICD-10-CM

## 2014-11-17 DIAGNOSIS — A419 Sepsis, unspecified organism: Secondary | ICD-10-CM

## 2014-11-17 DIAGNOSIS — J80 Acute respiratory distress syndrome: Secondary | ICD-10-CM

## 2014-11-17 LAB — COMPREHENSIVE METABOLIC PANEL
ALK PHOS: 45 U/L (ref 38–126)
ALT: 21 U/L (ref 17–63)
ANION GAP: 3 — AB (ref 5–15)
AST: 39 U/L (ref 15–41)
Albumin: 2.3 g/dL — ABNORMAL LOW (ref 3.5–5.0)
BILIRUBIN TOTAL: 0.9 mg/dL (ref 0.3–1.2)
BUN: 9 mg/dL (ref 6–20)
CALCIUM: 7.9 mg/dL — AB (ref 8.9–10.3)
CO2: 26 mmol/L (ref 22–32)
CREATININE: 0.72 mg/dL (ref 0.61–1.24)
Chloride: 116 mmol/L — ABNORMAL HIGH (ref 101–111)
GFR calc non Af Amer: >60 mL/min (ref >60)
GLUCOSE: 98 mg/dL (ref 65–99)
Potassium: 3 mmol/L — ABNORMAL LOW (ref 3.5–5.1)
Sodium: 145 mmol/L (ref 135–145)
TOTAL PROTEIN: 5 g/dL — AB (ref 6.5–8.1)

## 2014-11-17 LAB — MAGNESIUM: Magnesium: 2 mg/dL (ref 1.7–2.4)

## 2014-11-17 LAB — BLOOD GAS, ARTERIAL
ACID-BASE EXCESS: 0 mmol/L (ref 0.0–3.0)
BICARBONATE: 25.4 meq/L (ref 21.0–28.0)
FIO2: 0.5
MECHANICAL RATE: 35
MECHVT: 450 mL
O2 Saturation: 98.7 %
PATIENT TEMPERATURE: 37
PEEP/CPAP: 10 cmH2O
PO2 ART: 123 mmHg — AB (ref 83.0–108.0)
pCO2 arterial: 43 mmHg (ref 32.0–48.0)
pH, Arterial: 7.38 (ref 7.350–7.450)

## 2014-11-17 LAB — CBC
HCT: 35.2 % — ABNORMAL LOW (ref 40.0–52.0)
HEMOGLOBIN: 11.6 g/dL — AB (ref 13.0–18.0)
MCH: 29.8 pg (ref 26.0–34.0)
MCHC: 33 g/dL (ref 32.0–36.0)
MCV: 90.3 fL (ref 80.0–100.0)
PLATELETS: 135 10*3/uL — AB (ref 150–440)
RBC: 3.9 MIL/uL — ABNORMAL LOW (ref 4.40–5.90)
RDW: 14.4 % (ref 11.5–14.5)
WBC: 11.5 10*3/uL — ABNORMAL HIGH (ref 3.8–10.6)

## 2014-11-17 LAB — LIPID PANEL
CHOLESTEROL: 88 mg/dL (ref 0–200)
HDL: 29 mg/dL — ABNORMAL LOW (ref >40)
LDL Cholesterol: 49 mg/dL (ref 0–99)
TRIGLYCERIDES: 52 mg/dL (ref <150)
Total CHOL/HDL Ratio: 3 RATIO
VLDL: 10 mg/dL (ref 0–40)

## 2014-11-17 LAB — GLUCOSE, CAPILLARY
Glucose-Capillary: 101 mg/dL — ABNORMAL HIGH (ref 65–99)
Glucose-Capillary: 101 mg/dL — ABNORMAL HIGH (ref 65–99)
Glucose-Capillary: 102 mg/dL — ABNORMAL HIGH (ref 65–99)
Glucose-Capillary: 87 mg/dL (ref 65–99)
Glucose-Capillary: 94 mg/dL (ref 65–99)
Glucose-Capillary: 96 mg/dL (ref 65–99)

## 2014-11-17 LAB — CORTISOL: CORTISOL PLASMA: 21.9 ug/dL

## 2014-11-17 LAB — POTASSIUM: Potassium: 3.2 mmol/L — ABNORMAL LOW (ref 3.5–5.1)

## 2014-11-17 MED ORDER — NOREPINEPHRINE BITARTRATE 1 MG/ML IV SOLN: 0.0000 ug/min | INTRAVENOUS | Status: DC

## 2014-11-17 MED ORDER — POTASSIUM CHLORIDE 10 MEQ/100ML IV SOLN
10.0000 meq | INTRAVENOUS | Status: AC
  Administered 2014-11-17 (×4): 10 meq via INTRAVENOUS
  Filled 2014-11-17 (×4): qty 100

## 2014-11-17 MED ORDER — VITAL HIGH PROTEIN PO LIQD
1000.0000 mL | ORAL | Status: DC
  Administered 2014-11-17 – 2014-11-18 (×2): 1000 mL

## 2014-11-17 MED ORDER — SODIUM CHLORIDE 0.9 % IJ SOLN
INTRAMUSCULAR | Status: AC
  Filled 2014-11-17: qty 10

## 2014-11-17 MED ORDER — NOREPINEPHRINE 4 MG/250ML-% IV SOLN
0.0000 ug/min | INTRAVENOUS | Status: DC
  Administered 2014-11-17: 4 ug/min via INTRAVENOUS

## 2014-11-17 MED ORDER — SODIUM CHLORIDE 0.9 % IV SOLN: INTRAVENOUS | Status: DC | PRN

## 2014-11-17 MED ORDER — VITAL HIGH PROTEIN PO LIQD: 1000.0000 mL | ORAL | Status: DC

## 2014-11-17 MED ORDER — NOREPINEPHRINE 4 MG/250ML-% IV SOLN
INTRAVENOUS | Status: AC
  Administered 2014-11-17: 4 ug/min via INTRAVENOUS
  Filled 2014-11-17: qty 250

## 2014-11-17 NOTE — Progress Notes (Signed)
ANTIBIOTIC CONSULT NOTE - FOLLOW UP   Pharmacy Consult for Vancomycin and Zosyn Indication: pneumonia/sepsis  Allergies  Allergen Reactions  . Sulfa Antibiotics Other (See Comments)    Reaction:  Unknown     Patient Measurements: Height:  (180.3 cm) Weight: (!) 324 lb 8.3 oz (147.2 kg) IBW/kg (Calculated) : 75.3 Adjusted Body Weight: 103kg  Vital Signs: Temp: 99.3 F (37.4 C) (09/11 0730) Temp Source: Oral (09/11 0730) BP: 116/60 mmHg (09/11 0830) Pulse Rate: 78 (09/11 0830) Intake/Output from previous day: 09/10 0701 - 09/11 0700 In: 1642.6 [I.V.:668.6; NG/GT:224] Out: 1975 [Urine:1975] Intake/Output from this shift: Total I/O In: -  Out: 425 [Urine:425]  Labs:  Recent Labs  11/15/14 1059 11/16/14 0512 11/17/14 0505  WBC 16.5*  --  11.5*  HGB 14.3  --  11.6*  PLT 142*  --  135*  CREATININE 0.67 0.90 0.72   Estimated Creatinine Clearance: 209.6 mL/min (by C-G formula based on Cr of 0.72). No results for input(s): VANCOTROUGH, VANCOPEAK, VANCORANDOM, GENTTROUGH, GENTPEAK, GENTRANDOM, TOBRATROUGH, TOBRAPEAK, TOBRARND, AMIKACINPEAK, AMIKACINTROU, AMIKACIN in the last 72 hours.   Microbiology: Recent Results (from the past 720 hour(s))  MRSA PCR Screening     Status: None   Collection Time: 11/13/14  3:40 AM  Result Value Ref Range Status   MRSA by PCR NEGATIVE NEGATIVE Final    Comment:        The GeneXpert MRSA Assay (FDA approved for NASAL specimens only), is one component of a comprehensive MRSA colonization surveillance program. It is not intended to diagnose MRSA infection nor to guide or monitor treatment for MRSA infections.   Culture, bal-quantitative     Status: None (Preliminary result)   Collection Time: 11/15/14  9:53 AM  Result Value Ref Range Status   Specimen Description BRONCHIAL ALVEOLAR LAVAGE  Final   Special Requests NONE  Final   Gram Stain   Final    MANY WBC SEEN FEW GRAM POSITIVE COCCI RARE GRAM POSITIVE  RODS EXCELLENT SPECIMEN - 90-100% WBCS    Culture   Final    MODERATE GROWTH STAPHYLOCOCCUS AUREUS SUSCEPTIBILITIES TO FOLLOW    Report Status PENDING  Incomplete    Medical History: Past Medical History  Diagnosis Date  . Thyroid disease     Medications:  Prescriptions prior to admission  Medication Sig Dispense Refill Last Dose  . ALPRAZolam (XANAX) 1 MG tablet Take 1 mg by mouth 2 (two) times daily as needed for anxiety.   PRN at PRN  . citalopram (CELEXA) 40 MG tablet Take 40 mg by mouth daily.   unknown at unknown  . esomeprazole (NEXIUM) 40 MG capsule Take 40 mg by mouth daily.    unknown at unknown  . ketorolac (TORADOL) 10 MG tablet Take 1 tablet (10 mg total) by mouth every 8 (eight) hours as needed. (Patient taking differently: Take 10 mg by mouth every 8 (eight) hours as needed for moderate pain. ) 20 tablet 0 PRN at PRN  . lamoTRIgine (LAMICTAL) 150 MG tablet Take 150 mg by mouth 2 (two) times daily.   unknown at unknown  . levothyroxine (SYNTHROID, LEVOTHROID) 75 MCG tablet Take 75 mcg by mouth daily before breakfast.   unknown at unknown  . naltrexone (DEPADE) 50 MG tablet Take 50 mg by mouth 2 (two) times daily.    unknown at unknown  . ondansetron (ZOFRAN) 4 MG tablet Take 4 mg by mouth every 8 (eight) hours as needed for nausea or vomiting.   PRN  at PRN  . oxyCODONE-acetaminophen (PERCOCET) 10-325 MG per tablet Take 1 tablet by mouth every 6 (six) hours as needed for pain. 20 tablet 0 PRN at PRN  . promethazine (PHENERGAN) 25 MG suppository Place 25 mg rectally every 6 (six) hours as needed for nausea or vomiting.   PRN at PRN  . rizatriptan (MAXALT) 10 MG tablet Take 10 mg by mouth as needed for migraine. May repeat in 2 hours if needed   PRN at PRN   Assessment: 24 yo male being treated for sepsis and possible pneumonia, with pos staph aureus cultures.  Goal of Therapy:  Vancomycin trough level 15-20 mcg/ml  Plan:  TBW: 144kg IBW: 75.3kg  DW: 103kg Ke: 0.099   T1/2:7 hrs   Vd: 72.1L  Will give vancomycin LD of 2000mg  followed by vancomycin 1250mg  IV Q8H.  Check trough level prior to the 5th dose.   Will also start zosyn 3.375g IV Q8H given over 4 hours EI infusion.  Continue to monitor renal function.   Rayon Mcchristian D 11/17/2014,9:22 AM

## 2014-11-17 NOTE — Progress Notes (Signed)
ELECTROLYTE REPLACEMENT CONSULT NOTE - INITIAL  Pharmacy Consult for Electrolyte Replacement Indication: hypokalemia  Allergies  Allergen Reactions  . Sulfa Antibiotics Other (See Comments)    Reaction:  Unknown     Patient Measurements: Height:  (180.3 cm) Weight: (!) 324 lb 8.3 oz (147.2 kg) IBW/kg (Calculated) : 75.3   Vital Signs: Temp: 99.3 F (37.4 C) (09/11 0730) Temp Source: Oral (09/11 0730) BP: 124/69 mmHg (09/11 0930) Pulse Rate: 83 (09/11 0930) Intake/Output from previous day: 09/10 0701 - 09/11 0700 In: 1642.6 [I.V.:668.6; NG/GT:224] Out: 1975 [Urine:1975] Intake/Output from this shift: Total I/O In: 209 [I.V.:125; NG/GT:84] Out: 425 [Urine:425]  Labs:  Recent Labs  11/15/14 1059 11/17/14 0505  WBC 16.5* 11.5*  HGB 14.3 11.6*  HCT 43.0 35.2*  PLT 142* 135*     Recent Labs  11/15/14 1059 11/15/14 1131 11/16/14 0512 11/17/14 0505  NA 144  --  147* 145  K 4.1  --  3.8 3.0*  CL 114*  --  117* 116*  CO2 25  --  26 26  GLUCOSE 107*  --  128* 98  BUN 7  --  10 9  CREATININE 0.67  --  0.90 0.72  CALCIUM 8.1*  --  7.7* 7.9*  MG  --   --  1.9  --   PHOS 4.1  --  4.1  --   PROT 5.9*  --   --  5.0*  ALBUMIN 3.2*  --   --  2.3*  AST 26  --   --  39  ALT 25  --   --  21  ALKPHOS 48  --   --  45  BILITOT 1.0  --   --  0.9  TRIG  --  68  --  52  CHOLHDL  --   --   --  3.0  CHOL  --   --   --  88   Estimated Creatinine Clearance: 209.6 mL/min (by C-G formula based on Cr of 0.72).    Recent Labs  11/17/14 11/17/14 0405 11/17/14 0846  GLUCAP 87 102* 101*    Medical History: Past Medical History  Diagnosis Date  . Thyroid disease     Medications:  Prescriptions prior to admission  Medication Sig Dispense Refill Last Dose  . ALPRAZolam (XANAX) 1 MG tablet Take 1 mg by mouth 2 (two) times daily as needed for anxiety.   PRN at PRN  . citalopram (CELEXA) 40 MG tablet Take 40 mg by mouth daily.   unknown at unknown  . esomeprazole  (NEXIUM) 40 MG capsule Take 40 mg by mouth daily.    unknown at unknown  . ketorolac (TORADOL) 10 MG tablet Take 1 tablet (10 mg total) by mouth every 8 (eight) hours as needed. (Patient taking differently: Take 10 mg by mouth every 8 (eight) hours as needed for moderate pain. ) 20 tablet 0 PRN at PRN  . lamoTRIgine (LAMICTAL) 150 MG tablet Take 150 mg by mouth 2 (two) times daily.   unknown at unknown  . levothyroxine (SYNTHROID, LEVOTHROID) 75 MCG tablet Take 75 mcg by mouth daily before breakfast.   unknown at unknown  . naltrexone (DEPADE) 50 MG tablet Take 50 mg by mouth 2 (two) times daily.    unknown at unknown  . ondansetron (ZOFRAN) 4 MG tablet Take 4 mg by mouth every 8 (eight) hours as needed for nausea or vomiting.   PRN at PRN  . oxyCODONE-acetaminophen (PERCOCET) 10-325 MG per  tablet Take 1 tablet by mouth every 6 (six) hours as needed for pain. 20 tablet 0 PRN at PRN  . promethazine (PHENERGAN) 25 MG suppository Place 25 mg rectally every 6 (six) hours as needed for nausea or vomiting.   PRN at PRN  . rizatriptan (MAXALT) 10 MG tablet Take 10 mg by mouth as needed for migraine. May repeat in 2 hours if needed   PRN at PRN     Assessment: Potassium level of 3.0 on 9/11 @0500 .    Plan:  Will replace with 40 meq of IV potassium over 4 hours. Recheck potassium 1 hour after completion.  Will also check magnesium.   Jacqualyn Posey 11/17/2014,10:18 AM

## 2014-11-17 NOTE — Progress Notes (Signed)
ELECTROLYTE REPLACEMENT CONSULT NOTE - INITIAL  Pharmacy Consult for Electrolyte Replacement Indication: hypokalemia  Allergies  Allergen Reactions  . Sulfa Antibiotics Other (See Comments)    Reaction:  Unknown     Patient Measurements: Height:  (180.3 cm) Weight: (!) 324 lb 8.3 oz (147.2 kg) IBW/kg (Calculated) : 75.3   Vital Signs: Temp: 99.3 F (37.4 C) (09/11 1747) Temp Source: Oral (09/11 1747) BP: 108/62 mmHg (09/11 1830) Pulse Rate: 95 (09/11 1830) Intake/Output from previous day: 09/10 0701 - 09/11 0700 In: 1642.6 [I.V.:668.6; NG/GT:224] Out: 1975 [Urine:1975] Intake/Output from this shift:    Labs:  Recent Labs  11/15/14 1059 11/17/14 0505  WBC 16.5* 11.5*  HGB 14.3 11.6*  HCT 43.0 35.2*  PLT 142* 135*     Recent Labs  11/15/14 1059 11/15/14 1131 11/16/14 0512 11/17/14 0505 11/17/14 1744  NA 144  --  147* 145  --   K 4.1  --  3.8 3.0* 3.2*  CL 114*  --  117* 116*  --   CO2 25  --  26 26  --   GLUCOSE 107*  --  128* 98  --   BUN 7  --  10 9  --   CREATININE 0.67  --  0.90 0.72  --   CALCIUM 8.1*  --  7.7* 7.9*  --   MG  --   --  1.9  --  2.0  PHOS 4.1  --  4.1  --   --   PROT 5.9*  --   --  5.0*  --   ALBUMIN 3.2*  --   --  2.3*  --   AST 26  --   --  39  --   ALT 25  --   --  21  --   ALKPHOS 48  --   --  45  --   BILITOT 1.0  --   --  0.9  --   TRIG  --  68  --  52  --   CHOLHDL  --   --   --  3.0  --   CHOL  --   --   --  88  --    Estimated Creatinine Clearance: 209.6 mL/min (by C-G formula based on Cr of 0.72).    Recent Labs  11/17/14 0846 11/17/14 1224 11/17/14 1547  GLUCAP 101* 96 94    Medical History: Past Medical History  Diagnosis Date  . Thyroid disease     Medications:  Prescriptions prior to admission  Medication Sig Dispense Refill Last Dose  . ALPRAZolam (XANAX) 1 MG tablet Take 1 mg by mouth 2 (two) times daily as needed for anxiety.   PRN at PRN  . citalopram (CELEXA) 40 MG tablet Take 40 mg  by mouth daily.   unknown at unknown  . esomeprazole (NEXIUM) 40 MG capsule Take 40 mg by mouth daily.    unknown at unknown  . ketorolac (TORADOL) 10 MG tablet Take 1 tablet (10 mg total) by mouth every 8 (eight) hours as needed. (Patient taking differently: Take 10 mg by mouth every 8 (eight) hours as needed for moderate pain. ) 20 tablet 0 PRN at PRN  . lamoTRIgine (LAMICTAL) 150 MG tablet Take 150 mg by mouth 2 (two) times daily.   unknown at unknown  . levothyroxine (SYNTHROID, LEVOTHROID) 75 MCG tablet Take 75 mcg by mouth daily before breakfast.   unknown at unknown  . naltrexone (DEPADE) 50 MG tablet  Take 50 mg by mouth 2 (two) times daily.    unknown at unknown  . ondansetron (ZOFRAN) 4 MG tablet Take 4 mg by mouth every 8 (eight) hours as needed for nausea or vomiting.   PRN at PRN  . oxyCODONE-acetaminophen (PERCOCET) 10-325 MG per tablet Take 1 tablet by mouth every 6 (six) hours as needed for pain. 20 tablet 0 PRN at PRN  . promethazine (PHENERGAN) 25 MG suppository Place 25 mg rectally every 6 (six) hours as needed for nausea or vomiting.   PRN at PRN  . rizatriptan (MAXALT) 10 MG tablet Take 10 mg by mouth as needed for migraine. May repeat in 2 hours if needed   PRN at PRN     Assessment: Potassium level of 3.0 on 9/11 @0500 . Pharmacy consulted for electrolyte management.   Plan:  Will replace with 40 meq of IV potassium over 4 hours. Recheck potassium 1 hour after completion.  Will also check magnesium.   9/11 @1900  K 3.2, Mg 2.0.  Will give another 40 meq of IV potassium over 4 hours. Will recheck K with 0500 labs tomorrow AM.  Maryjo Rochester, PharmD Clinical Pharmacist 11/17/2014

## 2014-11-17 NOTE — Progress Notes (Signed)
Nutrition Follow-up   INTERVENTION:   EN: Secondary to current discontinuation of propofol, recommend titration of Vital High Protein to 27mL/hr with Prostat QID to provide 1360kcals and 144g protein with free water flushes. Will continue to follow and make recommendations.   NUTRITION DIAGNOSIS:   Inadequate oral intake related to acute illness as evidenced by NPO status.  GOAL:   Patient will meet greater than or equal to 90% of their needs  MONITOR:    (Energy Intake, Anthropometrics, Electrolyte/Renal Profile, Digestive System)  REASON FOR ASSESSMENT:   Consult Enteral/tube feeding initiation and management  ASSESSMENT:    Per MD note, pt remains intubated with ARDs now on fentanyl and versed.  Diet Order:  Diet NPO time specified    Current Nutrition: Pt tolerating Vital High Protein at 52mL/hr with Prostat QID.    Gastrointestinal Profile: Last BM: no BM since admission   Medications: NS at 57mL/hr, Propofol (providing 76kcals in last 24 hours), Versed, fentanyl  Electrolyte/Renal Profile and Glucose Profile:   Recent Labs Lab 11/12/14 2326 11/15/14 1059 11/16/14 0512 11/17/14 0505  NA 141 144 147* 145  K 3.8 4.1 3.8 3.0*  CL 108 114* 117* 116*  CO2 BUN CREATININE 0.82 0.67 0.90 0.72  CALCIUM 8.5* 8.1* 7.7* 7.9*  MG 2.2  --  1.9  --   PHOS  --  4.1 4.1  --   GLUCOSE 93 107* 128* 98   Protein Profile:  Recent Labs Lab 11/12/14 2326 11/15/14 1059 11/17/14 0505  ALBUMIN 3.8 3.2* 2.3*     Weight Trend since Admission: Filed Weights   11/15/14 0435 11/16/14 0500 11/17/14 0429  Weight: 314 lb 13.1 oz (142.8 kg) 318 lb 5.5 oz (144.4 kg) 324 lb 8.3 oz (147.2 kg)    BMI:  Body mass index is 45.28 kg/(m^2).  Estimated Nutritional Needs:   Kcal:  1571-1999 kcals   Protein:  140-175 g(2.0-2.5 g/kg) using IBW 70 kg  Fluid:  1750-2100 mL (25-30 ml/kg)    HIGH Care Level  Leda Quail, RD, LDN Pager [phone]

## 2014-11-17 NOTE — Progress Notes (Signed)
PHARMACY - CRITICAL CARE PROGRESS NOTE  Pharmacy Consult for Constipation Prevention    Allergies  Allergen Reactions  . Sulfa Antibiotics Other (See Comments)    Reaction:  Unknown     Patient Measurements: Height: 5\' 11"  (180.3 cm) Weight: (!) 324 lb 8.3 oz (147.2 kg) IBW/kg (Calculated) : 75.3  Vital Signs: Temp: 99.3 F (37.4 C) (09/11 0730) Temp Source: Oral (09/11 0730) BP: 116/60 mmHg (09/11 0830) Pulse Rate: 78 (09/11 0830) Intake/Output from previous day: 09/10 0701 - 09/11 0700 In: 1642.6 [I.V.:668.6; NG/GT:224] Out: 1975 [Urine:1975] Intake/Output from this shift: Total I/O In: -  Out: 425 [Urine:425] Vent settings for last 24 hours: Vent Mode:  [-] PRVC FiO2 (%):  [30 %-50 %] 50 % Set Rate:  [18 bmp-35 bmp] 35 bmp Vt Set:  [450 mL-530 mL] 450 mL PEEP:  [8 cmH20-10 cmH20] 10 cmH20 Plateau Pressure:  [25 cmH20-26 cmH20] 25 cmH20  Labs:  Recent Labs  11/15/14 1059 11/16/14 0512 11/17/14 0505  WBC 16.5*  --  11.5*  HGB 14.3  --  11.6*  HCT 43.0  --  35.2*  PLT 142*  --  135*  CREATININE 0.67 0.90 0.72  MG  --  1.9  --   PHOS 4.1 4.1  --   ALBUMIN 3.2*  --  2.3*  PROT 5.9*  --  5.0*  AST 26  --  39  ALT 25  --  21  ALKPHOS 48  --  45  BILITOT 1.0  --  0.9   Estimated Creatinine Clearance: 209.6 mL/min (by C-G formula based on Cr of 0.72).   Recent Labs  11/17/14 11/17/14 0405 11/17/14 0846  GLUCAP 87 102* 101*    Microbiology: Recent Results (from the past 720 hour(s))  MRSA PCR Screening     Status: None   Collection Time: 11/13/14  3:40 AM  Result Value Ref Range Status   MRSA by PCR NEGATIVE NEGATIVE Final    Comment:        The GeneXpert MRSA Assay (FDA approved for NASAL specimens only), is one component of a comprehensive MRSA colonization surveillance program. It is not intended to diagnose MRSA infection nor to guide or monitor treatment for MRSA infections.   Culture, bal-quantitative     Status: None (Preliminary  result)   Collection Time: 11/15/14  9:53 AM  Result Value Ref Range Status   Specimen Description BRONCHIAL ALVEOLAR LAVAGE  Final   Special Requests NONE  Final   Gram Stain   Final    MANY WBC SEEN FEW GRAM POSITIVE COCCI RARE GRAM POSITIVE RODS EXCELLENT SPECIMEN - 90-100% WBCS    Culture   Final    MODERATE GROWTH STAPHYLOCOCCUS AUREUS SUSCEPTIBILITIES TO FOLLOW    Report Status PENDING  Incomplete    Medications:  Scheduled:  . antiseptic oral rinse  7 mL Mouth Rinse QID  . budesonide (PULMICORT) nebulizer solution  0.5 mg Nebulization BID  . chlorhexidine gluconate  15 mL Mouth Rinse BID  . feeding supplement (PRO-STAT SUGAR FREE 64)  30 mL Oral QID  . feeding supplement (VITAL HIGH PROTEIN)  1,000 mL Per Tube Q24H  . free water  100 mL Per Tube 6 times per day  . heparin  5,000 Units Subcutaneous 3 times per day  . Influenza vac split quadrivalent PF  0.5 mL Intramuscular Tomorrow-1000  . ipratropium-albuterol  3 mL Nebulization Q4H  . levothyroxine  37.5 mcg Intravenous Daily  . pantoprazole (PROTONIX) IV  40 mg  Intravenous Q24H  . piperacillin-tazobactam (ZOSYN)  IV  3.375 g Intravenous 3 times per day  . senna-docusate  1 tablet Oral BID  . vancomycin  1,250 mg Intravenous Q8H   Infusions:  . sodium chloride 75 mL/hr at 11/17/14 0700  . fentaNYL infusion INTRAVENOUS 300 mcg/hr (11/17/14 0600)  . midazolam (VERSED) infusion 20 mg/hr (11/17/14 0740)  . norepinephrine 4 mcg/min (11/17/14 0843)   PRN: acetaminophen **OR** acetaminophen, docusate sodium, fentaNYL, midazolam, ziprasidone  Assessment: [name] with acute respiratory failure currently intubated and sedated on propofol and fentanyl.   Plan:  Patient on docusate/senna 1 tablet PO BID. Will reassess need for additional bowel regimen agents upon discussion of interdisciplinary clinical rounds.   Demetrius Charity, PharmD

## 2014-11-17 NOTE — Progress Notes (Signed)
o2 weaned earlier in shift to 30%. Increased back to 50%. Patient will low sats, partially awake, thrashing around in bed. o2 sat on 30% 87-lowest. 95-97% on 50%

## 2014-11-17 NOTE — Consult Note (Signed)
[facility] Consultation      Name: [name] MRN: [id number] DOB: 12/01/1990    ADMISSION DATE:  11/12/2014   CHIEF COMPLAINT:   Acute mental status changes, resp failure   SUBJECTIVE  Reintubated 9/9, bronch 9/9. Overnight with ARDS and severe agitation. Started on ARDS protocol by Elink, propofol and prn vec stopped, started on 6cc/kg vt, fentanyl/versed   Significant events: 9/7>>found down, obtunded, intubated in [facility] ED 9/8>>extubated 9/9>>reintubated for respiratory distress, CVL placed, bronch with copious secretions 9/10>>Elink>>started ARDS protocol for worsening respiratory failure on the vent (hypercapnia and tachypnea with agitation)   PAST MEDICAL HISTORY    :  Past Medical History  Diagnosis Date  . Thyroid disease    History reviewed. No pertinent past surgical history. Prior to Admission medications   Medication Sig Start Date End Date Taking? Authorizing Provider  citalopram (CELEXA) 40 MG tablet Take 40 mg by mouth daily.    Historical Provider, MD  esomeprazole (NEXIUM) 40 MG capsule Take 40 mg by mouth daily at 12 noon.    Historical Provider, MD  ketorolac (TORADOL) 10 MG tablet Take 1 tablet (10 mg total) by mouth every 8 (eight) hours as needed. 09/24/14   Gregor Hams, MD  lamoTRIgine (LAMICTAL) 150 MG tablet Take 150 mg by mouth 2 (two) times daily.    Historical Provider, MD  levothyroxine (SYNTHROID, LEVOTHROID) 25 MCG tablet Take 25 mcg by mouth daily before breakfast.    Historical Provider, MD  naltrexone (DEPADE) 50 MG tablet Take 25 mg by mouth daily.    Historical Provider, MD  oxyCODONE-acetaminophen (PERCOCET) 10-325 MG per tablet Take 1 tablet by mouth every 6 (six) hours as needed for pain. 10/01/14 10/01/15  Gregor Hams, MD   Allergies  Allergen Reactions  . Sulfa Antibiotics Other (See Comments)    Reaction:  Unknown      FAMILY HISTORY   No family history on file.    SOCIAL HISTORY    reports that he has been smoking Cigarettes.  He has been smoking about 1.00 pack per day. He does not have any smokeless tobacco history on file. He reports that he does not drink alcohol. His drug history is not on file.  Review of Systems  Unable to perform ROS: critical illness  sedated and intubated    VITAL SIGNS    Temp:  [97.7 F (36.5 C)-99.3 F (37.4 C)] 99.3 F (37.4 C) (09/11 0730) Pulse Rate:  [78-118] 83 (09/11 0930) Resp:  [18-35] 35 (09/11 0930) BP: (84-155)/(37-90) 124/69 mmHg (09/11 0930) SpO2:  [93 %-100 %] 99 % (09/11 0930) FiO2 (%):  [30 %-50 %] 50 % (09/11 0812) Weight:  [324 lb 8.3 oz (147.2 kg)] 324 lb 8.3 oz (147.2 kg) (09/11 0429) HEMODYNAMICS: CVP:  [8 mmHg-24 mmHg] 10 mmHg VENTILATOR SETTINGS: Vent Mode:  [-] PRVC FiO2 (%):  [30 %-50 %] 50 % Set Rate:  [18 bmp-35 bmp] 35 bmp Vt Set:  [450 mL-530 mL] 450 mL PEEP:  [8 cmH20-10 cmH20] 10 cmH20 Plateau Pressure:  [25 cmH20-26 cmH20] 25 cmH20 INTAKE / OUTPUT:  Intake/Output Summary (Last 24 hours) at 11/17/14 1019 Last data filed at 11/17/14 1000  Gross per 24 hour  Intake 1561.6 ml  Output   2125 ml  Net -563.4 ml       PHYSICAL EXAM   Physical Exam  Constitutional: He appears well-developed and well-nourished. No distress.  HENT:  Head: Normocephalic and atraumatic.  Eyes: Pupils are  equal, round, and reactive to light. No scleral icterus.  Neck: Normal range of motion. Neck supple.  Cardiovascular: Normal rate and regular rhythm.   No murmur heard. Pulmonary/Chest: He is in respiratory distress. He has no wheezes. He has no rales.  Dec basilar BS  Abdominal: Soft. He exhibits no distension. There is no tenderness.  Musculoskeletal: He exhibits no edema.  Neurological: He displays normal reflexes. Coordination normal.  gcs<8T  Skin: Skin is warm. No rash noted. He is not diaphoretic.  Nursing note and vitals reviewed.      LABS   LABS:  CBC  Recent Labs Lab  11/12/14 2326 11/15/14 1059 11/17/14 0505  WBC 10.6 16.5* 11.5*  HGB 15.0 14.3 11.6*  HCT 44.6 43.0 35.2*  PLT 167 142* 135*   Coag's No results for input(s): APTT, INR in the last 168 hours. BMET  Recent Labs Lab 11/15/14 1059 11/16/14 0512 11/17/14 0505  NA 144 147* 145  K 4.1 3.8 3.0*  CL 114* 117* 116*  CO2 _0 BUN _1 CREATININE 0.67 0.90 0.72  GLUCOSE 107* 128* 98   Electrolytes  Recent Labs Lab 11/12/14 2326 11/15/14 1059 11/16/14 0512 11/17/14 0505  CALCIUM 8.5* 8.1* 7.7* 7.9*  MG 2.2  --  1.9  --   PHOS  --  4.1 4.1  --    Sepsis Markers  Recent Labs Lab 11/12/14 2343 11/16/14 1730 11/16/14 1959  LATICACIDVEN 0.7 1.1 1.2   ABG  Recent Labs Lab 11/16/14 1735 11/16/14 1918 11/17/14 0601  PHART 7.19* 7.35 7.38  PCO2ART 68* 42 43  PO2ART 69* 156* 123*   Liver Enzymes  Recent Labs Lab 11/12/14 2326 11/15/14 1059 11/17/14 0505  AST 21 26 39  ALT _2 ALKPHOS 51 48 45  BILITOT 0.7 1.0 0.9  ALBUMIN 3.8 3.2* 2.3*   Cardiac Enzymes  Recent Labs Lab 11/12/14 2326  TROPONINI <0.03   Glucose  Recent Labs Lab 11/16/14 1106 11/16/14 1551 11/16/14 2007 11/17/14 11/17/14 0405 11/17/14 0846  GLUCAP 108* 118* 134* 87 102* 101*     Recent Results (from the past 240 hour(s))  MRSA PCR Screening     Status: None   Collection Time: 11/13/14  3:40 AM  Result Value Ref Range Status   MRSA by PCR NEGATIVE NEGATIVE Final    Comment:        The GeneXpert MRSA Assay (FDA approved for NASAL specimens only), is one component of a comprehensive MRSA colonization surveillance program. It is not intended to diagnose MRSA infection nor to guide or monitor treatment for MRSA infections.   Culture, bal-quantitative     Status: None (Preliminary result)   Collection Time: 11/15/14  9:53 AM  Result Value Ref Range Status   Specimen Description BRONCHIAL ALVEOLAR LAVAGE  Final   Special Requests NONE  Final   Gram Stain    Final    MANY WBC SEEN FEW GRAM POSITIVE COCCI RARE GRAM POSITIVE RODS EXCELLENT SPECIMEN - 90-100% WBCS    Culture   Final    MODERATE GROWTH STAPHYLOCOCCUS AUREUS SUSCEPTIBILITIES TO FOLLOW    Report Status PENDING  Incomplete     Current facility-administered medications:  .  0.9 %  sodium chloride infusion, , Intravenous, Continuous, Shaquel Chavous, MD, Last Rate: 75 mL/hr at 11/17/14 1000 .  acetaminophen (TYLENOL) tablet 650 mg, 650 mg, Oral, Q6H PRN **OR** acetaminophen (TYLENOL) suppository 650 mg, 650 mg, Rectal, Q6H PRN, Harrie Foreman,  MD .  antiseptic oral rinse solution (CORINZ), 7 mL, Mouth Rinse, QID, Elsie Stain, MD, 7 mL at 11/17/14 0412 .  budesonide (PULMICORT) nebulizer solution 0.5 mg, 0.5 mg, Nebulization, BID, Flora Lipps, MD, 0.5 mg at 11/17/14 0811 .  chlorhexidine gluconate (PERIDEX) 0.12 % solution 15 mL, 15 mL, Mouth Rinse, BID, Elsie Stain, MD, 15 mL at 11/17/14 0745 .  docusate sodium (COLACE) capsule 100 mg, 100 mg, Oral, BID PRN, Elsie Stain, MD .  feeding supplement (PRO-STAT SUGAR FREE 64) liquid 30 mL, 30 mL, Oral, QID, Flora Lipps, MD, 30 mL at 11/16/14 2149 .  feeding supplement (VITAL HIGH PROTEIN) liquid 1,000 mL, 1,000 mL, Per Tube, Q24H, Flora Lipps, MD .  fentaNYL (SUBLIMAZE) bolus via infusion 50 mcg, 50 mcg, Intravenous, Q1H PRN, Elsie Stain, MD, 50 mcg at 11/17/14 0740 .  fentaNYL 2548mg in NS 2521m(1028mml) infusion-PREMIX, 25-400 mcg/hr, Intravenous, Continuous, PatElsie StainD, Last Rate: 30 mL/hr at 11/17/14 0944, 300 mcg/hr at 11/17/14 0944 .  free water 100 mL, 100 mL, Per Tube, 6 times per day, KurFlora LippsD, 100 mL at 11/17/14 0832 .  heparin injection 5,000 Units, 5,000 Units, Subcutaneous, 3 times per day, MicHarrie ForemanD, 5,000 Units at 11/17/14 0556 .  Influenza vac split quadrivalent PF (FLUARIX) injection 0.5 mL, 0.5 mL, Intramuscular, Tomorrow-1000, MicHarrie ForemanD .   ipratropium-albuterol (DUONEB) 0.5-2.5 (3) MG/3ML nebulizer solution 3 mL, 3 mL, Nebulization, Q4H, KurFlora LippsD, 3 mL at 11/17/14 0811 .  levothyroxine (SYNTHROID, LEVOTHROID) injection 37.5 mcg, 37.5 mcg, Intravenous, Daily, KurFlora LippsD, 37.5 mcg at 11/17/14 0939 .  midazolam (VERSED) 50 mg in sodium chloride 0.9 % 50 mL (1 mg/mL) infusion, 0-20 mg/hr, Intravenous, Continuous, PatElsie StainD, Last Rate: 20 mL/hr at 11/17/14 0900, 20 mg/hr at 11/17/14 0900 .  midazolam (VERSED) injection 4 mg, 4 mg, Intravenous, Q1H PRN, PatElsie StainD, 4 mg at 11/17/14 0636 .  norepinephrine (LEVOPHED) 4mg76m D5W 250mL73mmix infusion, 0-40 mcg/min, Intravenous, Titrated, SnehaEpifanio Lesches Last Rate: 15 mL/hr at 11/17/14 0843, 4 mcg/min at 11/17/14 0843 .  pantoprazole (PROTONIX) injection 40 mg, 40 mg, Intravenous, Q24H, ElizaColbert Coyer 40 mg at 11/17/14 0424 .  piperacillin-tazobactam (ZOSYN) IVPB 3.375 g, 3.375 g, Intravenous, 3 times per day, SnehaEpifanio Lesches 3.375 g at 11/17/14 0527 .  senna-docusate (Senokot-S) tablet 1 tablet, 1 tablet, Oral, BID, KuriaFlora Lipps 1 tablet at 11/16/14 2149 .  sodium chloride 0.9 % injection, , , ,  .  vancomycin (VANCOCIN) 1,250 mg in sodium chloride 0.9 % 250 mL IVPB, 1,250 mg, Intravenous, Q8H, SnehaEpifanio Lesches 1,250 mg at 11/17/14 0939 .  ziprasidone (GEODON) injection 20 mg, 20 mg, Intramuscular, BID PRN, KuriaFlora Lipps 20 mg at 11/17/14 0507  IMAGING    Dg Chest 1 View  11/16/2014   CLINICAL DATA:  Shortness of breath  EXAM: CHEST  1 VIEW  COMPARISON:  Yesterday  FINDINGS: Endotracheal tube remains in good position. The orogastric tube reaches the stomach at least. Right IJ central line, tip at the upper cavoatrial junction.  Although left-sided airspace opacity is diminished, right-sided opacity is increased. Stable normal heart size and mediastinal contours. No air leak.  IMPRESSION: 1. Improved left and increased  right airspace disease, now symmetric. Pulmonary edema (likely noncardiogenic) and widespread aspiration pneumonia could coexist. 2. Stable positioning of tubes and central line.   Electronically Signed  By: Monte Fantasia M.D.   On: 11/16/2014 12:10   Dg Chest Port 1 View  11/17/2014   CLINICAL DATA:  Respiratory failure.  EXAM: PORTABLE CHEST - 1 VIEW  COMPARISON:  11/16/2014  FINDINGS: Low lying ET tube with tip at the level of the carina. Consider withdrawing by 1 and half to 2 cm. The right IJ catheter tip is in the right atrium. Low lung volumes. Diffuse pulmonary edema is stable to slightly improved in the interval.  IMPRESSION: 1. Low lying endotracheal tube. Consider withdrawing by 1.5 to 2 cm. 2. Stable to improved aeration to the lungs.   Electronically Signed   By: Kerby Moors M.D.   On: 11/17/2014 09:20      Indwelling Urinary Catheter continued, requirement due to   Reason to continue Indwelling Urinary Catheter for strict Intake/Output monitoring for hemodynamic instability         Ventilator continued, requirement due to, resp failure    Ventilator Sedation RASS 0 to -2    INDWELLING DEVICES:: R IJ CVL>>9/9 R Radial Arterial line >>9/11  MICRO DATA: MRSA PCR >>negative Urine  Blood Resp >>pending>>GPC/GPR  ANTIMICROBIALS:  Clindamycin>>9/9 -9/10 Vanc 9/10> Zosyn 9/10>    ASSESSMENT/PLAN    24 yo obese white male admitted to ICU for acute encephalopathy from acute drug abuse and  drug overdose ?suicide Patient failed trial of extubation due to increased WOB and delerium with evidence of aspiration pneumonia  PULMONARY -Respiratory Failure-re-intubated - now on ARDS protocol for MV, Fentanyl/Versed, levophed -continue Full MV support -cont with Bronchodilator Therapy -Wean Fio2 and PEEP as tolerated -will perform SAT/SBt when respiratory parameters are met -aspiration pneumonia- abx -s/p bronch 9/9>>thick, purulent secretion throughout.     CARDIOVASCULAR -continue monitoring  RENAL Follow chem 7 -follow UO  GASTROINTESTINAL GI prophylaxis  HEMATOLOGIC -Follow h/h  INFECTIOUS - aspiration pneumonia - currently on clindamycin - see data sheet above  ENDOCRINE -Follow FSBS  NEUROLOGIC - intubated and sedated - minimal sedation to achieve a RASS goal: -1    I have personally obtained a history, examined the patient, evaluated laboratory and independently reviewed  imaging results, formulated the assessment and plan and placed orders.  The Patient requires high complexity decision making for assessment and support, frequent evaluation and titration of therapies, application of advanced monitoring technologies and extensive interpretation of multiple databases. Critical Care Time devoted to patient care services described in this note is 45 minutes.   Overall, patient is critically ill, prognosis is guarded.  Vilinda Boehringer, MD [facility] Pager [phone] (please enter 7-digits) On Call Pager - [phone] (please enter 7-digits)

## 2014-11-17 NOTE — Procedures (Signed)
  [facility] Consultation    Procedure Note:  Arterial Line Placement Ricky Gallery Brogan , [id number] , [id number]/[id number]  Indications:  Hemodynamic instability / recurrent ABG draws  Benefits, risks (including bleeding, infection,  Injury, etc.), and alternatives explained to father Merced Hanners) who voiced understanding.   Questions were sought and answered.  Father agreed to proceed with the procedure.  Consent is signed and on chart.    Allen's test performed to ensure adequate perfusion.  Time out was performed verifying correct patient, procedure, site, positioning, and special catheter was available at the time of procedure.  Patient's RIGHT RADIAL artery was prepped and draped in usual sterile fashion.  1 % Lidocaine WAS used to anesthetize the area.  Total number of attempts were 3, using ultrasound.  An arterial line was introduced into the RIGHT RADIAL artery.  Catheter threaded and the needle was removed with appropriate blood return.  Blood loss was minimal.  Patient tolerated the procedure well, and there were no complications.     Stephanie Acre, MD [facility] Pager [phone] (please enter 7-digits) On Call Pager - [phone] (please enter 7-digits)

## 2014-11-17 NOTE — Progress Notes (Signed)
Pt remains intubated currently on 30% FiO2 with O2 sats 92-96%; pt having periods of delirium prn sedation medication administered Dr. Dema Severin aware; no wakeup assessment performed this am per Dr Dema Severin due to increased agitation and delirium; right arterial line placed per Dr. Dema Severin; vs currently stable; pt tolerating tube feeds; adequate uop via foley; sinus tach on cardiac monitor; will continue to monitor and assess pt

## 2014-11-17 NOTE — Progress Notes (Addendum)
[facility] - [place] [facility]   PATIENT NAME: [name]    MR#:  [id number]  DATE OF BIRTH:  1990/10/10  SUBJECTIVE: 24 year old male patient admitted for acute respiratory failure secondary to possible drug overdose.extubated, reintubated again secondary to pneumonia. Marland KitchenPost bronchoscopy on 9/9 showed a lot of thick purulent  secretions. Patient now in ARDS secondary to hypoxia requiring change in  vent settings, reviewed the note from eICU physician last night. Patient to respiratory rate increased to 35, PEEP  is up to 10. Patient also is hypotensive secondary to high PEEP.   CHIEF COMPLAINT:   Chief Complaint  Patient presents with  . Drug Overdose    REVIEW OF SYSTEMS:    Review of Systems  Unable to perform ROS: intubated  Constitutional: Negative for fever and chills.  HENT: Negative for hearing loss.   Eyes: Negative for blurred vision, double vision and photophobia.  Respiratory: Negative for cough, hemoptysis and shortness of breath.   Cardiovascular: Negative for palpitations, orthopnea and leg swelling.  Gastrointestinal: Negative for vomiting, abdominal pain and diarrhea.  Genitourinary: Negative for dysuria and urgency.  Musculoskeletal: Negative for myalgias and neck pain.  Skin: Negative for rash.  Neurological: Negative for dizziness, focal weakness, seizures, weakness and headaches.  Psychiatric/Behavioral: Negative for memory loss. The patient does not have insomnia.     Nutrition: npo Tolerating Diet: Tolerating PT:      DRUG ALLERGIES:   Allergies  Allergen Reactions  . Sulfa Antibiotics Other (See Comments)    Reaction:  Unknown     VITALS:  Blood pressure 116/60, pulse 78, temperature 99.3 F (37.4 C), temperature source Oral, resp. rate 35, height 5\' 11"  (1.803 m), weight 147.2 kg (324 lb 8.3 oz), SpO2 99 %.  PHYSICAL EXAMINATION:   Physical Exam  GENERAL:  24 y.o.-year-old patient lying in the bed  ,intubated, sedated.  EYES: Pupils equal, round, reactive to light.HEENT: Head atraumatic, normocephalic.orally  intubated.  NECK:  Supple, no jugular venous distention. No thyroid enlargement, no tenderness.  LUNGS: bilateral coarse breath sounds,, decreased more on the left side.  CARDIOVASCULAR: S1, S2 normal. No murmurs, rubs, or gallops.  ABDOMEN: Soft, nontender, nondistended. Bowel sounds present. No organomegaly or mass.  EXTREMITIES: No pedal edema, cyanosis, or clubbing.  NEUROLOGIC: Intubated, sedated. PSYCHIATRIC: intubated , sedated. SKIN: No obvious rash, lesion, or ulcer.    LABORATORY PANEL:   CBC  Recent Labs Lab 11/17/14 0505  WBC 11.5*  HGB 11.6*  HCT 35.2*  PLT 135*   ------------------------------------------------------------------------------------------------------------------  Chemistries   Recent Labs Lab 11/16/14 0512 11/17/14 0505  NA 147* 145  K 3.8 3.0*  CL 117* 116*  CO2 26 26  GLUCOSE 128* 98  BUN 10 9  CREATININE 0.90 0.72  CALCIUM 7.7* 7.9*  MG 1.9  --   AST  --  39  ALT  --  21  ALKPHOS  --  45  BILITOT  --  0.9   ------------------------------------------------------------------------------------------------------------------  Cardiac Enzymes  Recent Labs Lab 11/12/14 2326  TROPONINI <0.03   ------------------------------------------------------------------------------------------------------------------  RADIOLOGY:  Dg Chest 1 View  11/16/2014   CLINICAL DATA:  Shortness of breath  EXAM: CHEST  1 VIEW  COMPARISON:  Yesterday  FINDINGS: Endotracheal tube remains in good position. The orogastric tube reaches the stomach at least. Right IJ central line, tip at the upper cavoatrial junction.  Although left-sided airspace opacity is diminished, right-sided opacity is increased. Stable normal heart size and mediastinal contours. No air  leak.  IMPRESSION: 1. Improved left and increased right airspace disease, now symmetric.  Pulmonary edema (likely noncardiogenic) and widespread aspiration pneumonia could coexist. 2. Stable positioning of tubes and central line.   Electronically Signed   By: Marnee Spring M.D.   On: 11/16/2014 12:10   Dg Chest Port 1 View  11/15/2014   CLINICAL DATA:  Central line NG tube placement.  EXAM: PORTABLE CHEST - 1 VIEW  COMPARISON:  11/13/2014.  FINDINGS: Interim placement of right IJ line, its tip is in the upper portion the right atrium. Interim placement NG tube, its tip is below left hemidiaphragm. Endotracheal tube tip 12 mm above the carina . Cardiomegaly. Bilateral pulmonary alveolar infiltrates, left side greater right. Findings suggest congestive heart failure pulmonary edema. Bilateral pneumonia cannot be excluded. No pleural effusion or pneumothorax .  IMPRESSION: 1. Interim placement of right IJ line, its tip is projected over the upper right atrium. NG tube placement with tip below left hemidiaphragm. Endotracheal tube tip 12 mm above the carina. 2. Cardiomegaly with bilateral pulmonary infiltrates left side greater right. Findings consistent congestive heart failure pulmonary edema. Bilateral pneumonia cannot be excluded.   Electronically Signed   By: Maisie Fus  Register   On: 11/15/2014 09:17   Dg Abd Portable 1v  11/15/2014   CLINICAL DATA:  Gastric tube placement  EXAM: PORTABLE ABDOMEN - 1 VIEW  COMPARISON:  CT abdomen 09/24/2014.  Chest x-ray 11/13/2014  FINDINGS: Gastric tube is coiled in the stomach. Gas is present in the colon which is nondilated. Small bowel nondilated. Normal bowel gas pattern. No renal calculi.  IMPRESSION: Gastric tube coiled in the stomach.  Normal bowel gas pattern.   Electronically Signed   By: Marlan Palau M.D.   On: 11/15/2014 09:16     ASSESSMENT AND PLAN:   Principal Problem:   Acute respiratory failure with hypoxia Active Problems:   Overdose   Aspiration pneumonia   Autism   Bipolar 1 disorder   ARDS (adult respiratory distress syndrome)    Severe sepsis with acute organ dysfunction   1,Acute respiratory failure; reintubated, secondary to to pneumonia, possible aspiration, status post  bronchoscopy on September 9, cultures from bronchoscopy showing staph aureus but no full final results are pending..on vanco, Zosyn. Antibiotics were changed yesterday. ARDS: Due to widespread pneumonia: Continue full vent support, antibiotics, follow bronchial wash culture results. Ventilator settings are adjusted. Critical care is following.   Hypotension secondary to sedation:/high PEEP with ARDS: Continue IV fluids. 2. possible suicidal attempt;with xanax;ivc,psych consult, spoke with patient's father yesterday. Patient is going through  Bereavement as he lost his mother  recently and having hard time with that . 3. Bipolar disorder we will restart them after extubation.  Sitter, psych consult after extubation. Has autism, bipolar disorder, PTSD. With his recent loss of the mother patient is more depressed. Dad is concerned that he may have taken in an attempted suicide, took Xanax 2 mg tablets 12 of them. Full code Condition;critical   All the records are reviewed and case discussed with Care Management/Social Workerr. Management plans discussed with the patient, family and they are in agreement.  CODE STATUS: Full  TOTAL TIME TAKING CARE OF THIS PATIENT: 35 minutes. POSSIBLE D/C IN 1-2 DAYS, DEPENDING ON CLINICAL CONDITION.   Katha Hamming M.D on 11/17/2014 at 9:06 AM  Between 7am to 6pm - Pager - [phone]  After 6pm go to [email or web address] - password [email or web address] [facility]  [facility]  Office  [phone]  CC: Primary  care physician; Derwood Kaplan, MD

## 2014-11-17 NOTE — Progress Notes (Signed)
Pt getting agitated after Fentanyl and Versed PRN administration, notified elink who advised going to max rate of 400 on Fentanyl

## 2014-11-18 DIAGNOSIS — F319 Bipolar disorder, unspecified: Secondary | ICD-10-CM

## 2014-11-18 DIAGNOSIS — R410 Disorientation, unspecified: Secondary | ICD-10-CM

## 2014-11-18 LAB — BASIC METABOLIC PANEL
ANION GAP: 5 (ref 5–15)
BUN: 11 mg/dL (ref 6–20)
CALCIUM: 8.2 mg/dL — AB (ref 8.9–10.3)
CO2: 28 mmol/L (ref 22–32)
CREATININE: 0.57 mg/dL — AB (ref 0.61–1.24)
Chloride: 113 mmol/L — ABNORMAL HIGH (ref 101–111)
Glucose, Bld: 100 mg/dL — ABNORMAL HIGH (ref 65–99)
Potassium: 3.4 mmol/L — ABNORMAL LOW (ref 3.5–5.1)
Sodium: 146 mmol/L — ABNORMAL HIGH (ref 135–145)

## 2014-11-18 LAB — CULTURE, BAL-QUANTITATIVE

## 2014-11-18 LAB — HEPATIC FUNCTION PANEL
ALBUMIN: 2.3 g/dL — AB (ref 3.5–5.0)
ALT: 24 U/L (ref 17–63)
AST: 33 U/L (ref 15–41)
Alkaline Phosphatase: 51 U/L (ref 38–126)
BILIRUBIN DIRECT: 0.3 mg/dL (ref 0.1–0.5)
BILIRUBIN TOTAL: 1.1 mg/dL (ref 0.3–1.2)
Indirect Bilirubin: 0.8 mg/dL (ref 0.3–0.9)
Total Protein: 5 g/dL — ABNORMAL LOW (ref 6.5–8.1)

## 2014-11-18 LAB — MAGNESIUM: MAGNESIUM: 1.9 mg/dL (ref 1.7–2.4)

## 2014-11-18 LAB — GLUCOSE, CAPILLARY
Glucose-Capillary: 100 mg/dL — ABNORMAL HIGH (ref 65–99)
Glucose-Capillary: 107 mg/dL — ABNORMAL HIGH (ref 65–99)
Glucose-Capillary: 78 mg/dL (ref 65–99)
Glucose-Capillary: 81 mg/dL (ref 65–99)
Glucose-Capillary: 88 mg/dL (ref 65–99)

## 2014-11-18 LAB — CBC
HCT: 34 % — ABNORMAL LOW (ref 40.0–52.0)
Hemoglobin: 11.3 g/dL — ABNORMAL LOW (ref 13.0–18.0)
MCH: 29.9 pg (ref 26.0–34.0)
MCHC: 33.4 g/dL (ref 32.0–36.0)
MCV: 89.6 fL (ref 80.0–100.0)
PLATELETS: 183 10*3/uL (ref 150–440)
RBC: 3.79 MIL/uL — ABNORMAL LOW (ref 4.40–5.90)
RDW: 14.4 % (ref 11.5–14.5)
WBC: 9.2 10*3/uL (ref 3.8–10.6)

## 2014-11-18 LAB — CULTURE, BAL-QUANTITATIVE W GRAM STAIN

## 2014-11-18 MED ORDER — ACETAMINOPHEN 325 MG PO TABS
650.0000 mg | ORAL_TABLET | Freq: Four times a day (QID) | ORAL | Status: DC | PRN
  Administered 2014-11-19 (×2): 650 mg
  Filled 2014-11-18 (×3): qty 2

## 2014-11-18 MED ORDER — FUROSEMIDE 10 MG/ML IJ SOLN
40.0000 mg | Freq: Once | INTRAMUSCULAR | Status: AC
  Administered 2014-11-18: 40 mg via INTRAVENOUS
  Filled 2014-11-18: qty 4

## 2014-11-18 MED ORDER — IPRATROPIUM-ALBUTEROL 0.5-2.5 (3) MG/3ML IN SOLN: 3.0000 mL | RESPIRATORY_TRACT | Status: DC | PRN

## 2014-11-18 MED ORDER — QUETIAPINE FUMARATE 100 MG PO TABS
200.0000 mg | ORAL_TABLET | Freq: Two times a day (BID) | ORAL | Status: DC
  Administered 2014-11-18 – 2014-11-21 (×6): 200 mg
  Filled 2014-11-18 (×2): qty 2
  Filled 2014-11-18: qty 8
  Filled 2014-11-18 (×5): qty 2

## 2014-11-18 MED ORDER — LEVOTHYROXINE SODIUM 75 MCG PO TABS
75.0000 ug | ORAL_TABLET | Freq: Every day | ORAL | Status: DC
  Administered 2014-11-18 – 2014-11-21 (×4): 75 ug
  Filled 2014-11-18 (×4): qty 1

## 2014-11-18 MED ORDER — PROPOFOL 1000 MG/100ML IV EMUL
0.0000 ug/kg/min | INTRAVENOUS | Status: DC
  Administered 2014-11-18 (×2): 40 ug/kg/min via INTRAVENOUS
  Administered 2014-11-18: 35 ug/kg/min via INTRAVENOUS
  Administered 2014-11-18: 25 ug/kg/min via INTRAVENOUS
  Administered 2014-11-18: 45 ug/kg/min via INTRAVENOUS
  Administered 2014-11-18 – 2014-11-19 (×3): 40 ug/kg/min via INTRAVENOUS
  Administered 2014-11-20: 10:00:00 via INTRAVENOUS
  Filled 2014-11-18 (×8): qty 100

## 2014-11-18 MED ORDER — CLONAZEPAM 0.5 MG PO TABS
2.0000 mg | ORAL_TABLET | Freq: Two times a day (BID) | ORAL | Status: DC
  Administered 2014-11-18 (×2): 2 mg
  Filled 2014-11-18 (×2): qty 4

## 2014-11-18 MED ORDER — FENTANYL 2500MCG IN NS 250ML (10MCG/ML) PREMIX INFUSION
25.0000 ug/h | INTRAVENOUS | Status: DC
  Administered 2014-11-18: 400 ug/h via INTRAVENOUS

## 2014-11-18 MED ORDER — DOCUSATE SODIUM 100 MG PO CAPS
100.0000 mg | ORAL_CAPSULE | Freq: Two times a day (BID) | ORAL | Status: DC
  Administered 2014-11-18: 100 mg via ORAL
  Filled 2014-11-18: qty 1

## 2014-11-18 MED ORDER — POTASSIUM CHLORIDE 20 MEQ/15ML (10%) PO SOLN
40.0000 meq | Freq: Once | ORAL | Status: AC
  Administered 2014-11-18: 40 meq
  Filled 2014-11-18: qty 30

## 2014-11-18 MED ORDER — PROPOFOL 1000 MG/100ML IV EMUL
INTRAVENOUS | Status: AC
  Administered 2014-11-18: 25 ug/kg/min via INTRAVENOUS
  Filled 2014-11-18: qty 100

## 2014-11-18 MED ORDER — MIDAZOLAM HCL 2 MG/2ML IJ SOLN
1.0000 mg | INTRAMUSCULAR | Status: DC | PRN
  Administered 2014-11-20 (×2): 4 mg via INTRAVENOUS
  Filled 2014-11-18 (×2): qty 4

## 2014-11-18 MED ORDER — FENTANYL 100 MCG/HR TD PT72
200.0000 ug | MEDICATED_PATCH | TRANSDERMAL | Status: DC
  Administered 2014-11-18: 200 ug via TRANSDERMAL
  Filled 2014-11-18: qty 2

## 2014-11-18 MED ORDER — SENNOSIDES-DOCUSATE SODIUM 8.6-50 MG PO TABS
1.0000 | ORAL_TABLET | Freq: Two times a day (BID) | ORAL | Status: DC
  Administered 2014-11-18 – 2014-11-21 (×5): 1
  Filled 2014-11-18 (×5): qty 1

## 2014-11-18 MED ORDER — SODIUM CHLORIDE 0.9 % IV SOLN
0.0000 mg/h | INTRAVENOUS | Status: DC
  Administered 2014-11-18: 6 mg/h via INTRAVENOUS
  Administered 2014-11-18: 13 mg/h via INTRAVENOUS
  Filled 2014-11-18: qty 10

## 2014-11-18 MED ORDER — CEFTRIAXONE SODIUM 1 G IJ SOLR
1.0000 g | INTRAMUSCULAR | Status: DC
Start: 2014-11-18 — End: 2014-11-19
  Administered 2014-11-18 – 2014-11-19 (×2): 1 g via INTRAVENOUS
  Filled 2014-11-18 (×3): qty 10

## 2014-11-18 MED ORDER — PANTOPRAZOLE SODIUM 40 MG PO PACK
40.0000 mg | PACK | Freq: Every day | ORAL | Status: DC
  Administered 2014-11-18 – 2014-11-20 (×3): 40 mg
  Filled 2014-11-18 (×3): qty 20

## 2014-11-18 MED ORDER — POTASSIUM CHLORIDE 10 MEQ/100ML IV SOLN
10.0000 meq | INTRAVENOUS | Status: DC
  Filled 2014-11-18 (×3): qty 100

## 2014-11-18 MED ORDER — POTASSIUM CHLORIDE 2 MEQ/ML IV SOLN
INTRAVENOUS | Status: DC
  Administered 2014-11-18 – 2014-11-21 (×4): via INTRAVENOUS
  Filled 2014-11-18 (×6): qty 1000

## 2014-11-18 NOTE — Care Management Note (Signed)
Case Management Note  Patient Details  Name: [name] MRN: [id number] Date of Birth: Jan 10, 1991  Subjective/Objective:  Re intubated for the second time since admission. Currently on Fentanyl, Versed and Propofol.  LATCH was presented to Dr. Bard Herbert and he declined to pursue LATCH at this time                  Action/Plan: Following.   Expected Discharge Date:                  Expected Discharge Plan:     In-House Referral:  Clinical Social Work  Discharge planning Services  CM Consult  Post Acute Care Choice:    Choice offered to:     DME Arranged:    DME Agency:     HH Arranged:    HH Agency:     Status of Service:  In process, will continue to follow  Medicare Important Message Given:    Date Medicare IM Given:    Medicare IM give by:    Date Additional Medicare IM Given:    Additional Medicare Important Message give by:     If discussed at Long Length of Stay Meetings, dates discussed:    Additional Comments:  Marily Memos, RN 11/18/2014, 11:18 AM

## 2014-11-18 NOTE — Progress Notes (Signed)
Nutrition Follow-up    INTERVENTION:   EN: recommend continuing current TF regimen of Vital High Protein, Prostat TID. Started on D5, provided additional 204 kcals. Meets low end of calorie range, protein needs. Will continue to assess goal rate based on diprivan rate   NUTRITION DIAGNOSIS:   Inadequate oral intake related to acute illness as evidenced by NPO status. Being addressed via TF  GOAL:   Patient will meet greater than or equal to 90% of their needs  MONITOR:    (Energy Intake, Anthropometrics, Electrolyte/Renal Profile, Digestive System)   ASSESSMENT:   Pt remains on vent  EN: tolerating Vital High Protein at rate of 40 ml/hr  Digestive System:  No signs of TF intolerance, residuals <500 mL, abdomen soft, BS present  Last BM:   no BM since admission, Fleet Contras RN checked pt for impaction, no stool present  Electrolyte and Renal Profile:  Recent Labs Lab 11/15/14 1059 11/16/14 0512 11/17/14 0505 11/17/14 1744 11/18/14 0601  BUN --  11  CREATININE 0.67 0.90 0.72  --  0.57*  NA 144 147* 145  --  146*  K 4.1 3.8 3.0* 3.2* 3.4*  MG  --  1.9  --  2.0 1.9  PHOS 4.1 4.1  --   --   --    Meds: D5 started at 50 ml/hr today (204 kcals), diprivan restarted, lasix, fentanyl, potassium chloride, senokot  Height:   Ht Readings from Last 1 Encounters:  11/16/14  (1.803 m)    Weight:   Wt Readings from Last 1 Encounters:  11/18/14 324 lb 4.8 oz (147.1 kg)    BMI:  Body mass index is 45.25 kg/(m^2).  Estimated Nutritional Needs:   Kcal:  1610-9604 kcals   Protein:  140-175 g(2.0-2.5 g/kg) using IBW 70 kg  Fluid:  1750-2100 mL (25-30 ml/kg)   Romelle Starcher MS, RD, LDN [phone] Pager

## 2014-11-18 NOTE — Progress Notes (Signed)
Dr. Sung Amabile called and was updated on patient status.  Verbal order received for propofol.

## 2014-11-18 NOTE — Progress Notes (Signed)
Dr. Sherryll Burger paged and notified that patient is on Fentanyl 400 mcg/hr and versed 20 mg/hr continuous and also requiring boluses of fentanyl and versed.  With all medication on board patient does not allow nurse to listen with stethoscope, reposition, or do mouth care.  With any basic care patient becomes restless, agitated, heart rate elevated to 110's, patient bites down on ETT and de-SATs.  No orders obtained.  Dr. Sherryll Burger verbalized he would review chart before giving further orders.

## 2014-11-18 NOTE — Consult Note (Signed)
  Psychiatry: Came by to follow-up on patient with overdose of benzodiazepine's. Patient is back on the ventilator and nonresponsive. Chart reviewed. No further suggestions at this point. I will continue to follow.

## 2014-11-18 NOTE — Progress Notes (Addendum)
[facility] - [place] at [facility]   PATIENT NAME: [name]    MR#:  [id number]  DATE OF BIRTH:  1990-07-02  SUBJECTIVE: 24 year old male patient admitted for acute respiratory failure secondary to possible drug overdose.extubated, reintubated again secondary to pneumonia.  CHIEF COMPLAINT:   Chief Complaint  Patient presents with  . Drug Overdose  Per nursing - he was very agitated despite fentanyl @ 400 mcg/hr and midaz 20 mg/hr ad started Propofol by CCM earlier which seem to have helped  REVIEW OF SYSTEMS:   Review of Systems  Unable to perform ROS: intubated   DRUG ALLERGIES:   Allergies  Allergen Reactions  . Sulfa Antibiotics Other (See Comments)    Reaction:  Unknown    VITALS:  Blood pressure 127/74, pulse 96, temperature 99 F (37.2 C), temperature source Oral, resp. rate 19, height  (1.803 m), weight 324 lb 4.8 oz (147.1 kg), SpO2 92 %. PHYSICAL EXAMINATION:  Physical Exam  Constitutional: He is well-developed, well-nourished, and in no distress.  HENT:  Head: Normocephalic and atraumatic.  Endotracheal tube in place  Eyes: Conjunctivae and EOM are normal. Pupils are equal, round, and reactive to light.  Neck: Normal range of motion. Neck supple. No tracheal deviation present. No thyromegaly present.  Cardiovascular: Normal rate, regular rhythm and normal heart sounds.   Pulmonary/Chest: Effort normal and breath sounds normal. No respiratory distress. He has no wheezes. He exhibits no tenderness.  Abdominal: Soft. Bowel sounds are normal. He exhibits no distension. There is no tenderness.  Musculoskeletal: Normal range of motion.  Neurological:  Unable to assess due to sedation on ventilator  Skin: Skin is warm and dry. No rash noted.  Psychiatric:  Unable to assess due to sedation on ventilator   LABORATORY PANEL:   CBC  Recent Labs Lab 11/18/14 0601  WBC 9.2  HGB 11.3*  HCT 34.0*  PLT 183    ------------------------------------------------------------------------------------------------------------------  Chemistries   Recent Labs Lab 11/18/14 0601  NA 146*  K 3.4*  CL 113*  CO2 28  GLUCOSE 100*  BUN 11  CREATININE 0.57*  CALCIUM 8.2*  MG 1.9  AST 33  ALT 24  ALKPHOS 51  BILITOT 1.1   RADIOLOGY:  Dg Chest Port 1 View  11/17/2014   CLINICAL DATA:  Respiratory failure.  EXAM: PORTABLE CHEST - 1 VIEW  COMPARISON:  11/16/2014  FINDINGS: Low lying ET tube with tip at the level of the carina. Consider withdrawing by 1 and half to 2 cm. The right IJ catheter tip is in the right atrium. Low lung volumes. Diffuse pulmonary edema is stable to slightly improved in the interval.  IMPRESSION: 1. Low lying endotracheal tube. Consider withdrawing by 1.5 to 2 cm. 2. Stable to improved aeration to the lungs.   Electronically Signed   By: Signa Kell M.D.   On: 11/17/2014 09:20   ASSESSMENT AND PLAN:  1. Acute respiratory failure; reintubated, secondary to to pneumonia, possible aspiration, status post  bronchoscopy on September 9, cultures from bronchoscopy showing staph aureus but no full final results are pending..on vanco, Zosyn.  Pulmonary is following For vent management  2. possible suicidal attempt;with xanax;ivc,psych consult, Patient is going through  Bereavement as he lost his mother  recently and having hard time with that .  3. Bipolar disorder we will restart them after extubation.  4.  Hypokalemia.  Will replete and recheck  Sitter, psych consult after extubation. Has autism, bipolar disorder, PTSD. With his  recent loss of the mother patient is more depressed. Dad is concerned that he may have taken in an attempted suicide, took Xanax 2 mg tablets 12 of them. Full code Condition;critical   All the records are reviewed and case discussed with Care Management/Social Worker. Management plans discussed with the nursing and Dr. Darrol Angel and they are in  agreement.  CODE STATUS: Full  TOTAL TIME (Critical Care) TAKING CARE OF THIS PATIENT: 35 minutes.   [facility], Jovonne Wilton M.D on 11/18/2014 at 3:33 PM  Between 7am to 6pm - Pager - [phone]  After 6pm go to [email or web address] - password [email or web address] [facility]  [place] [facility]  Office  [phone]  CC: Primary care physician; Derwood Kaplan, MD

## 2014-11-18 NOTE — Progress Notes (Signed)
PULMONARY / CRITICAL CARE MEDICINE   Name: [name] MRN: [id number] DOB: 08/03/1990     Very agitated despite fentanyl @ 400 mcg/hr and midaz 20 mg/hr Propofol initiated with much better control of agitation RASS -4, not F/C  Filed Vitals:   11/18/14 0900 11/18/14 1100 11/18/14 1200 11/18/14 1300  BP: 131/75 126/70 131/72 127/74  Pulse: 87 80 92 96  Temp: 98.9 F (37.2 C)   99 F (37.2 C)  TempSrc: Oral   Oral  Resp: 35 35 20 19  Height:      Weight:      SpO2: 98% 100% 91% 92%   Sedated, intubated HEENT WNL JVP cannot be assessed Slightly coarse BS, no wheezes Mildly tachy, reg, no M noted Obese, soft, NT, +BS Symmetric ankle and pretibial edema MAEs, no focal deficits  BMP Latest Ref Rng 11/18/2014 11/17/2014 11/17/2014  Glucose 65 - 99 mg/dL 782(N) - 98  BUN 6 - 20 mg/dL 11 - 9  Creatinine 5.62 - 1.24 mg/dL 1.30(Q) - 6.57  Sodium 135 - 145 mmol/L 146(H) - 145  Potassium 3.5 - 5.1 mmol/L 3.4(L) 3.2(L) 3.0(L)  Chloride 101 - 111 mmol/L 113(H) - 116(H)  CO2 22 - 32 mmol/L 28 - 26  Calcium 8.9 - 10.3 mg/dL 8.2(L) - 7.9(L)    CBC Latest Ref Rng 11/18/2014 11/17/2014 11/15/2014  WBC 3.8 - 10.6 K/uL 9.2 11.5(H) 16.5(H)  Hemoglobin 13.0 - 18.0 g/dL 11.3(L) 11.6(L) 14.3  Hematocrit 40.0 - 52.0 % 34.0(L) 35.2(L) 43.0  Platelets 150 - 440 K/uL 183 135(L) 142(L)    CXR: mild diffuse interstitial and alveolar infiltrates  MAJOR EVENTS/TEST RESULTS: 9/06 UDS: + BZDs, + THC 9/07 CT head: NAD   INDWELLING DEVICES: R radial A-line 9/11 >> 9/12 ETT 9/06 >> 9/09, 9/09 >>  R IJ CVL 9/09 >>   MICRO DATA: MRSA PCR 9/07 >> NEG BAL 9/09 >> MSSA  ANTIMICROBIALS:  Clinda 9/09 >> 9/10 Vanc 9/10 >> 9/12 Pip-tazo 9/10 >> 9/12 Ceftriaxone 9/12 >>    ASSESSMENT / PLAN:  PULMONARY A: Acute respiratory failure, hypoxic ALI/ARDS, radiographically improving Mucus plugging P:   Cont full vent support - settings reviewed and/or adjusted Cont vent bundle Daily SBT  if/when meets criteria  CARDIOVASCULAR A:  Transient hypotension, resolved P:  DC CVP monitoring DC A-line MAP goal > 65 mmHg  RENAL A:   Hypervolemia Hypernatremia Hypokalemia P:   Monitor BMET intermittently Monitor I/Os Correct electrolytes as indicated Lasix X 1 9/12 D5W initiated 9/12  GASTROINTESTINAL A:   Mild abd distention Constipation P:   SUP:  Cont TFs Bowel regimen adjusted 9/12  HEMATOLOGIC A:   Mild ICU acquired anemia P:  DVT px: SQ heparin Monitor CBC intermittently Transfuse per usual ICU guidelines  INFECTIOUS A:   MSSA PNA P:   Monitor temp, WBC count Micro and abx as above  ENDOCRINE A:  No issues P:   CBG q 8 hrs SSI for glu > 180  NEUROLOGIC A:   Polysubstance abuse Bipolar D/O Severe agitated delirium Very high tolerance to opiates and BZDs P:   RASS goal: -2 Resume propofol 9/12 Begin scheduled clonazepam 9/12 Wean midaz gtt to off as tolerated  Begin scheduled quetiapine 9/12 Fentanyl patch 9/12 Wean fentanyl gtt to off as tolerated  FAMILY: none @ bedside 9/12    CCM time: 45 mins     [name], MD PCCM service Mobile [phone] Pager [phone]  11/18/2014, 3:15 PM

## 2014-11-18 NOTE — Progress Notes (Signed)
Dr. Sherryll Burger paged again with same issue.  Per Dr. Sherryll Burger he will page intensivist to discuss case.

## 2014-11-18 NOTE — Progress Notes (Signed)
ANTIBIOTIC CONSULT NOTE - INITIAL  Pharmacy Consult for Vancomycin and Zosyn Indication: pneumonia/sepsis  Allergies  Allergen Reactions  . Sulfa Antibiotics Other (See Comments)    Reaction:  Unknown     Patient Measurements: Height:  (180.3 cm) Weight: (!) 324 lb 4.8 oz (147.1 kg) IBW/kg (Calculated) : 75.3 Adjusted Body Weight: 103kg  Vital Signs: Temp: 99.2 F (37.3 C) (09/11 2330) Temp Source: Axillary (09/11 2330) BP: 121/67 mmHg (09/12 0500) Pulse Rate: 97 (09/12 0500) Intake/Output from previous day: 09/11 0701 - 09/12 0700 In: 4746.4 [I.V.:2402.4; NG/GT:744; IV Piggyback:1600] Out: 3115 [Urine:3115] Intake/Output from this shift: Total I/O In: 3644.9 [I.V.:1604.9; NG/GT:440; IV Piggyback:1600] Out: 1040 [Urine:1040]  Labs:  Recent Labs  11/15/14 1059 11/16/14 0512 11/17/14 0505 11/18/14 0601  WBC 16.5*  --  11.5* 9.2  HGB 14.3  --  11.6* 11.3*  PLT 142*  --  135* 183  CREATININE 0.67 0.90 0.72  --    Estimated Creatinine Clearance: 209.4 mL/min (by C-G formula based on Cr of 0.72). No results for input(s): VANCOTROUGH, VANCOPEAK, VANCORANDOM, GENTTROUGH, GENTPEAK, GENTRANDOM, TOBRATROUGH, TOBRAPEAK, TOBRARND, AMIKACINPEAK, AMIKACINTROU, AMIKACIN in the last 72 hours.   Microbiology: Recent Results (from the past 720 hour(s))  MRSA PCR Screening     Status: None   Collection Time: 11/13/14  3:40 AM  Result Value Ref Range Status   MRSA by PCR NEGATIVE NEGATIVE Final    Comment:        The GeneXpert MRSA Assay (FDA approved for NASAL specimens only), is one component of a comprehensive MRSA colonization surveillance program. It is not intended to diagnose MRSA infection nor to guide or monitor treatment for MRSA infections.   Culture, bal-quantitative     Status: None (Preliminary result)   Collection Time: 11/15/14  9:53 AM  Result Value Ref Range Status   Specimen Description BRONCHIAL ALVEOLAR LAVAGE  Final   Special Requests NONE   Final   Gram Stain   Final    MANY WBC SEEN FEW GRAM POSITIVE COCCI RARE GRAM POSITIVE RODS EXCELLENT SPECIMEN - 90-100% WBCS    Culture MODERATE GROWTH STAPHYLOCOCCUS AUREUS  Final   Report Status PENDING  Incomplete   Organism ID, Bacteria STAPHYLOCOCCUS AUREUS  Final      Susceptibility   Staphylococcus aureus - MIC*    CIPROFLOXACIN Value in next row Sensitive      SENSITIVE<=0.5    ERYTHROMYCIN Value in next row Sensitive      SENSITIVE<=0.25    GENTAMICIN Value in next row Sensitive      SENSITIVE<=0.5    OXACILLIN Value in next row Sensitive      SENSITIVE<=0.25    TETRACYCLINE Value in next row Sensitive      SENSITIVE<=1    CLINDAMYCIN Value in next row Sensitive      SENSITIVE<=0.25    CEFOXITIN SCREEN Value in next row Sensitive      SENSITIVE<=0.25    Inducible Clindamycin Value in next row Sensitive      SENSITIVE<=0.25    LINEZOLID Value in next row Sensitive      SENSITIVE2    TRIMETH/SULFA Value in next row Sensitive      SENSITIVE<=10    * MODERATE GROWTH STAPHYLOCOCCUS AUREUS    Medical History: Past Medical History  Diagnosis Date  . Thyroid disease     Medications:  Prescriptions prior to admission  Medication Sig Dispense Refill Last Dose  . ALPRAZolam (XANAX) 1 MG tablet Take 1 mg by mouth  2 (two) times daily as needed for anxiety.   PRN at PRN  . citalopram (CELEXA) 40 MG tablet Take 40 mg by mouth daily.   unknown at unknown  . esomeprazole (NEXIUM) 40 MG capsule Take 40 mg by mouth daily.    unknown at unknown  . ketorolac (TORADOL) 10 MG tablet Take 1 tablet (10 mg total) by mouth every 8 (eight) hours as needed. (Patient taking differently: Take 10 mg by mouth every 8 (eight) hours as needed for moderate pain. ) 20 tablet 0 PRN at PRN  . lamoTRIgine (LAMICTAL) 150 MG tablet Take 150 mg by mouth 2 (two) times daily.   unknown at unknown  . levothyroxine (SYNTHROID, LEVOTHROID) 75 MCG tablet Take 75 mcg by mouth daily before breakfast.    unknown at unknown  . naltrexone (DEPADE) 50 MG tablet Take 50 mg by mouth 2 (two) times daily.    unknown at unknown  . ondansetron (ZOFRAN) 4 MG tablet Take 4 mg by mouth every 8 (eight) hours as needed for nausea or vomiting.   PRN at PRN  . oxyCODONE-acetaminophen (PERCOCET) 10-325 MG per tablet Take 1 tablet by mouth every 6 (six) hours as needed for pain. 20 tablet 0 PRN at PRN  . promethazine (PHENERGAN) 25 MG suppository Place 25 mg rectally every 6 (six) hours as needed for nausea or vomiting.   PRN at PRN  . rizatriptan (MAXALT) 10 MG tablet Take 10 mg by mouth as needed for migraine. May repeat in 2 hours if needed   PRN at PRN   Assessment: 24 yo male being treated for sepsis and possible pneumonia, with pos staph aureus cultures.  Goal of Therapy:  Vancomycin trough level 15-20 mcg/ml  Plan:  TBW: 144kg IBW: 75.3kg  DW: 103kg Ke: 0.099  T1/2:7 hrs   Vd: 72.1L  Will give vancomycin LD of 2000mg  followed by vancomycin 1250mg  IV Q8H.  Check trough level prior to the 5th dose.   Will also start zosyn 3.375g IV Q8H given over 4 hours EI infusion.  Continue to monitor renal function.  9/12 0200 trough not done. Rescheduled for 10:00.  Fulton Reek, PharmD, BCPS  11/18/2014

## 2014-11-19 ENCOUNTER — Inpatient Hospital Stay: Payer: Federal, State, Local not specified - PPO

## 2014-11-19 DIAGNOSIS — R401 Stupor: Secondary | ICD-10-CM

## 2014-11-19 LAB — CBC
HEMATOCRIT: 37.2 % — AB (ref 40.0–52.0)
HEMOGLOBIN: 12.3 g/dL — AB (ref 13.0–18.0)
MCH: 30 pg (ref 26.0–34.0)
MCHC: 33.1 g/dL (ref 32.0–36.0)
MCV: 90.5 fL (ref 80.0–100.0)
Platelets: 196 10*3/uL (ref 150–440)
RBC: 4.11 MIL/uL — ABNORMAL LOW (ref 4.40–5.90)
RDW: 14.6 % — ABNORMAL HIGH (ref 11.5–14.5)
WBC: 11.3 10*3/uL — ABNORMAL HIGH (ref 3.8–10.6)

## 2014-11-19 LAB — COMPREHENSIVE METABOLIC PANEL
ALBUMIN: 2.5 g/dL — AB (ref 3.5–5.0)
ALK PHOS: 72 U/L (ref 38–126)
ALT: 34 U/L (ref 17–63)
ANION GAP: 5 (ref 5–15)
AST: 50 U/L — ABNORMAL HIGH (ref 15–41)
BILIRUBIN TOTAL: 0.7 mg/dL (ref 0.3–1.2)
BUN: 13 mg/dL (ref 6–20)
CALCIUM: 8.4 mg/dL — AB (ref 8.9–10.3)
CO2: 33 mmol/L — ABNORMAL HIGH (ref 22–32)
Chloride: 110 mmol/L (ref 101–111)
Creatinine, Ser: 0.73 mg/dL (ref 0.61–1.24)
GLUCOSE: 107 mg/dL — AB (ref 65–99)
Potassium: 4 mmol/L (ref 3.5–5.1)
Sodium: 148 mmol/L — ABNORMAL HIGH (ref 135–145)
TOTAL PROTEIN: 5.8 g/dL — AB (ref 6.5–8.1)

## 2014-11-19 LAB — MAGNESIUM: Magnesium: 2.2 mg/dL (ref 1.7–2.4)

## 2014-11-19 LAB — PHOSPHORUS: Phosphorus: 3.9 mg/dL (ref 2.5–4.6)

## 2014-11-19 LAB — BRAIN NATRIURETIC PEPTIDE: B Natriuretic Peptide: 30 pg/mL (ref 0.0–100.0)

## 2014-11-19 LAB — GLUCOSE, CAPILLARY: Glucose-Capillary: 105 mg/dL — ABNORMAL HIGH (ref 65–99)

## 2014-11-19 MED ORDER — FENTANYL BOLUS VIA INFUSION
50.0000 ug | INTRAVENOUS | Status: DC | PRN
  Filled 2014-11-19: qty 50

## 2014-11-19 MED ORDER — CLONAZEPAM 0.5 MG PO TABS
1.0000 mg | ORAL_TABLET | Freq: Two times a day (BID) | ORAL | Status: DC
  Administered 2014-11-19 – 2014-11-21 (×4): 1 mg
  Filled 2014-11-19 (×4): qty 2

## 2014-11-19 MED ORDER — DOCUSATE SODIUM 50 MG/5ML PO LIQD: 100.0000 mg | Freq: Two times a day (BID) | ORAL | Status: DC

## 2014-11-19 MED ORDER — VITAL HIGH PROTEIN PO LIQD
1000.0000 mL | ORAL | Status: DC
  Administered 2014-11-19 – 2014-11-20 (×2): 1000 mL

## 2014-11-19 MED ORDER — FENTANYL 100 MCG/HR TD PT72: 100.0000 ug | MEDICATED_PATCH | TRANSDERMAL | Status: DC

## 2014-11-19 MED ORDER — FUROSEMIDE 10 MG/ML IJ SOLN
40.0000 mg | Freq: Once | INTRAMUSCULAR | Status: AC
  Administered 2014-11-19: 40 mg via INTRAVENOUS
  Filled 2014-11-19: qty 4

## 2014-11-19 MED ORDER — DEXTROSE 5 % IV SOLN
2.0000 g | INTRAVENOUS | Status: DC
  Filled 2014-11-19 (×2): qty 2

## 2014-11-19 MED ORDER — DEXTROSE 5 % IV SOLN
1.0000 g | Freq: Once | INTRAVENOUS | Status: AC
  Administered 2014-11-19: 1 g via INTRAVENOUS
  Filled 2014-11-19: qty 10

## 2014-11-19 NOTE — Consult Note (Signed)
  Psychiatry: Follow-up for patient status post overdose. Came by to see him. He is still in the critical care unit and intubated although he seems to be slightly more reactive this evening. Couldn't get any interviewer any further information. We'll follow-up as needed

## 2014-11-19 NOTE — Progress Notes (Signed)
Nutrition Follow-up     INTERVENTION:   EN: recommend increasing to rate of 50 ml/hr with decrease in Prostat to TID; provides 1500 kcals, 151 g of protein, 1008 mL of free water (additional free water with meds, prostat). Additional free water flush of 100 mL q 4 hours currently ordered. Continue to assess   NUTRITION DIAGNOSIS:   Inadequate oral intake related to acute illness as evidenced by NPO status. Being addressed via TF  GOAL:   Patient will meet greater than or equal to 90% of their needs   MONITOR:    (Energy Intake, Anthropometrics, Electrolyte/Renal Profile, Digestive System)   ASSESSMENT:    Pt remains on vent, diprivan to be discontinued, starting precedex  EN: tolerating Vital High Protein at rate of 40 ml/hr  Digestive System: abdomen soft, BS present, no signs of TF intolerance, residuals 0-380 mL  Last BM:   no BM since admission,plan for suppository today per MD Simonds  Electrolyte and Renal Profile:  Recent Labs Lab 11/15/14 1059 11/16/14 0512 11/17/14 0505 11/17/14 1744 11/18/14 0601 11/19/14 0444  BUN --  11 13  CREATININE 0.67 0.90 0.72  --  0.57* 0.73  NA 144 147* 145  --  146* 148*  K 4.1 3.8 3.0* 3.2* 3.4* 4.0  MG  --  1.9  --  2.0 1.9 2.2  PHOS 4.1 4.1  --   --   --  3.9   Protein Profile:  Recent Labs Lab 11/17/14 0505 11/18/14 0601 11/19/14 0444  ALBUMIN 2.3* 2.3* 2.5*   Glucose Profile:  Recent Labs  11/18/14 0730 11/18/14 1131 11/19/14 0004  GLUCAP 78 81 105*   Meds: lasix 1 time dose, D5 at 50 ml/hr (204 kcals in 24 hours)  Height:   Ht Readings from Last 1 Encounters:  11/16/14  (1.803 m)    Weight:   Wt Readings from Last 1 Encounters:  11/19/14 315 lb 11.2 oz (143.2 kg)    Filed Weights   11/17/14 0429 11/18/14 0455 11/19/14 0444  Weight: 324 lb 8.3 oz (147.2 kg) 324 lb 4.8 oz (147.1 kg) 315 lb 11.2 oz (143.2 kg)   BMI:  Body mass index is 44.05 kg/(m^2).  Estimated Nutritional  Needs:   Kcal:  1610-9604 kcals   Protein:  140-175 g(2.0-2.5 g/kg) using IBW 70 kg  Fluid:  1750-2100 mL (25-30 ml/kg)   HIGH Care Level  Romelle Starcher MS, RD, LDN [phone] Pager

## 2014-11-19 NOTE — Progress Notes (Signed)
Patient intubated on ventilator. Dr. Sung Amabile rounded on patient and propofol omn hold for now. Patient's respirations labored with a rate of 53.

## 2014-11-19 NOTE — Progress Notes (Signed)
[facility] - [place] at [facility]   PATIENT NAME: [name]    MR#:  [id number]  DATE OF BIRTH:  [birth year]  SUBJECTIVE: 24 year old male patient admitted for acute respiratory failure secondary to possible drug overdose.extubated, reintubated again secondary to pneumonia.  CHIEF COMPLAINT:   Chief Complaint  Patient presents with  . Drug Overdose  off propofol. Fentanyl changed to 100 mcg/hr Patch this am REVIEW OF SYSTEMS:   Review of Systems  Unable to perform ROS: intubated   DRUG ALLERGIES:   Allergies  Allergen Reactions  . Sulfa Antibiotics Other (See Comments)    Reaction:  Unknown    VITALS:  Blood pressure 120/67, pulse 109, temperature 99.9 F (37.7 C), temperature source Axillary, resp. rate 20, height 5\' 11"  (1.803 m), weight 315 lb 11.2 oz (143.2 kg), SpO2 96 %. PHYSICAL EXAMINATION:  Physical Exam  Constitutional: He is well-developed, well-nourished, and in no distress.  HENT:  Head: Normocephalic and atraumatic.  Endotracheal tube in place  Eyes: Conjunctivae and EOM are normal. Pupils are equal, round, and reactive to light.  Neck: Normal range of motion. Neck supple. No tracheal deviation present. No thyromegaly present.  Cardiovascular: Normal rate, regular rhythm and normal heart sounds.   Pulmonary/Chest: Effort normal and breath sounds normal. No respiratory distress. He has no wheezes. He exhibits no tenderness.  Abdominal: Soft. Bowel sounds are normal. He exhibits no distension. There is no tenderness.  Musculoskeletal: Normal range of motion.  Neurological:  Unable to assess due to sedation on ventilator  Skin: Skin is warm and dry. No rash noted.  Psychiatric:  Unable to assess due to sedation on ventilator   LABORATORY PANEL:   CBC  Recent Labs Lab 11/19/14 0444  WBC 11.3*  HGB 12.3*  HCT 37.2*  PLT 196    ------------------------------------------------------------------------------------------------------------------  Chemistries   Recent Labs Lab 11/19/14 0444  NA 148*  K 4.0  CL 110  CO2 33*  GLUCOSE 107*  BUN 13  CREATININE 0.73  CALCIUM 8.4*  MG 2.2  AST 50*  ALT 34  ALKPHOS 72  BILITOT 0.7   RADIOLOGY:  Dg Chest Port 1 View  11/19/2014   CLINICAL DATA:  Ventilator patient.  Respiratory failure.  EXAM: PORTABLE CHEST - 1 VIEW  COMPARISON:  11/17/2014 and 11/16/2014.  FINDINGS: 0629 hours. The endotracheal tube is not significantly changed, tip 1.3 cm above the carina. Nasogastric tube projects below the diaphragm. Right IJ central venous catheter extends to the lower SVC level. The heart size and mediastinal contours are stable. There is mildly improved aeration of both lung bases with residual patchy left-greater-than-right pulmonary opacities. No pneumothorax or significant pleural effusion identified.  IMPRESSION: The endotracheal tube remains low, within 2 cm of the carina. Interval improved aeration of both lung bases.   Electronically Signed   By: Carey Bullocks M.D.   On: 11/19/2014 08:17   ASSESSMENT AND PLAN:  1. Acute respiratory failure; reintubated, secondary to to pneumonia, possible aspiration, status post  bronchoscopy on September 9, cultures from bronchoscopy showing methicillin sensitive staph aureus but no full final results are pending..on rocephin (Day 2).  Pulmonary is following For vent management  2. possible suicidal attempt;with xanax; ivc, psych consult, Patient is going through  Bereavement as he lost his mother  recently and having hard time with that .  3. Bipolar disorder: meds to be restarted  after extubation per psych eval  4.  Hypernatremia: monitor, given 1 time dose of lasix  today on 9/13 - should help. On D5W  Sitter, psych consult after extubation.  Has autism, bipolar disorder, PTSD. With his recent loss of the mother patient is  more depressed. Dad is concerned that he may have taken in an attempted suicide, took Xanax 2 mg tablets 12 of them. Full code Condition;critical   All the records are reviewed and case discussed with Care Management/Social Worker. Management plans discussed with the nursing and Dr. Darrol Angel and they are in agreement.  CODE STATUS: Full  TOTAL TIME (Critical Care)TAKING CARE OF THIS PATIENT: 35 minutes.   [facility], Lavarr President M.D on 11/19/2014 at 2:46 PM  Between 7am to 6pm - Pager - [phone]  After 6pm go to [email or web address] - password [email or web address] [facility]  [place] [facility]  Office  [phone]  CC: Primary care physician; Derwood Kaplan, MD

## 2014-11-19 NOTE — Progress Notes (Signed)
PULMONARY / CRITICAL CARE MEDICINE   Name: [name] MRN: [id number] DOB: 07/18/1990     Minimally responsive Propofol discontinued this AM RASS -4, not F/C  Filed Vitals:   11/19/14 0900 11/19/14 1000 11/19/14 1100 11/19/14 1148  BP: 107/57 118/69 136/78   Pulse: 97 98 110   Temp:    99.9 F (37.7 C)  TempSrc:    Axillary  Resp: Height:      Weight:      SpO2: 93% 93% 93%    Sedated, intubated HEENT WNL JVP cannot be assessed Slightly coarse BS, no wheezes Mildly tachy, reg, no M noted Obese, soft, NT, +BS Symmetric ankle and pretibial edema MAEs, no focal deficits  BMP Latest Ref Rng 11/19/2014 11/18/2014 11/17/2014  Glucose 65 - 99 mg/dL 409(W) 119(J) -  BUN 6 - 20 mg/dL 13 11 -  Creatinine 4.78 - 1.24 mg/dL 2.95 6.21(H) -  Sodium 135 - 145 mmol/L 148(H) 146(H) -  Potassium 3.5 - 5.1 mmol/L 4.0 3.4(L) 3.2(L)  Chloride 101 - 111 mmol/L 110 113(H) -  CO2 22 - 32 mmol/L 33(H) 28 -  Calcium 8.9 - 10.3 mg/dL 0.8(M) 5.7(Q) -    CBC Latest Ref Rng 11/19/2014 11/18/2014 11/17/2014  WBC 3.8 - 10.6 K/uL 11.3(H) 9.2 11.5(H)  Hemoglobin 13.0 - 18.0 g/dL 12.3(L) 11.3(L) 11.6(L)  Hematocrit 40.0 - 52.0 % 37.2(L) 34.0(L) 35.2(L)  Platelets 150 - 440 K/uL 196 183 135(L)    CXR: NSC diffuse interstitial and alveolar infiltrates  MAJOR EVENTS/TEST RESULTS: 9/06 UDS: + BZDs, + THC 9/07 CT head: NAD   INDWELLING DEVICES: R radial A-line 9/11 >> 9/12 ETT 9/06 >> 9/09, 9/09 >>  R IJ CVL 9/09 >>   MICRO DATA: MRSA PCR 9/07 >> NEG BAL 9/09 >> MSSA  ANTIMICROBIALS:  Clinda 9/09 >> 9/10 Vanc 9/10 >> 9/12 Pip-tazo 9/10 >> 9/12 Ceftriaxone 9/12 >>    ASSESSMENT / PLAN:  PULMONARY A: Acute respiratory failure, hypoxic ALI/ARDS, radiographically improving Mucus plugging, resolved Poor cognition prohibits extubation P:   Cont full vent support - settings reviewed and/or adjusted Cont vent bundle Daily SBT if/when meets criteria  CARDIOVASCULAR A:   Transient hypotension, resolved P:  MAP goal > 65 mmHg  RENAL A:   Hypervolemia Hypernatremia Hypokalemia P:   Monitor BMET intermittently Monitor I/Os Correct electrolytes as indicated Lasix X 1 9/13 Cont D5W - rate decreased 9/13  GASTROINTESTINAL A:   Mild abd distention Constipation P:   SUP: enteral PPI Cont TFs Bowel regimen adjusted 9/12  HEMATOLOGIC A:   Mild ICU acquired anemia P:  DVT px: SQ heparin Monitor CBC intermittently Transfuse per usual ICU guidelines  INFECTIOUS A:   MSSA PNA P:   Monitor temp, WBC count Micro and abx as above  ENDOCRINE A:  No issues P:   CBG q 8 hrs SSI for glu > 180  NEUROLOGIC A:   Polysubstance abuse Bipolar D/O Severe agitated delirium Very high tolerance to opiates and BZDs P:   RASS goal: -2 DC propofol 9/13 Cont scheduled clonazepam at reduced dose 9/13 Cont scheduled quetiapine - initiated 9/12 Change Fentanyl patch from 200 mcg/hr to 100 mcg/hr 9/13  FAMILY: none @ bedside 9/13   CCM time: 35 mins     [name], MD PCCM service Mobile [phone] Pager [phone]  11/19/2014, 1:26 PM

## 2014-11-20 ENCOUNTER — Inpatient Hospital Stay
Admit: 2014-11-20 | Discharge: 2014-11-20 | Disposition: A | Discharge status: Home / Self Care | Payer: Federal, State, Local not specified - PPO | Attending: Pulmonary Disease | Admitting: Pulmonary Disease

## 2014-11-20 ENCOUNTER — Inpatient Hospital Stay: Payer: Federal, State, Local not specified - PPO

## 2014-11-20 LAB — CBC
HCT: 38.7 % — ABNORMAL LOW (ref 40.0–52.0)
Hemoglobin: 12.7 g/dL — ABNORMAL LOW (ref 13.0–18.0)
MCH: 29.4 pg (ref 26.0–34.0)
MCHC: 32.9 g/dL (ref 32.0–36.0)
MCV: 89.6 fL (ref 80.0–100.0)
PLATELETS: 217 10*3/uL (ref 150–440)
RBC: 4.32 MIL/uL — AB (ref 4.40–5.90)
RDW: 14.2 % (ref 11.5–14.5)
WBC: 14.7 10*3/uL — ABNORMAL HIGH (ref 3.8–10.6)

## 2014-11-20 LAB — TRIGLYCERIDES: TRIGLYCERIDES: 204 mg/dL — AB (ref <150)

## 2014-11-20 LAB — BASIC METABOLIC PANEL
Anion gap: 9 (ref 5–15)
BUN: 17 mg/dL (ref 6–20)
CALCIUM: 8.2 mg/dL — AB (ref 8.9–10.3)
CO2: 33 mmol/L — AB (ref 22–32)
CREATININE: 0.71 mg/dL (ref 0.61–1.24)
Chloride: 102 mmol/L (ref 101–111)
GFR calc non Af Amer: >60 mL/min (ref >60)
Glucose, Bld: 118 mg/dL — ABNORMAL HIGH (ref 65–99)
Potassium: 3.8 mmol/L (ref 3.5–5.1)
Sodium: 144 mmol/L (ref 135–145)

## 2014-11-20 LAB — URINALYSIS COMPLETE WITH MICROSCOPIC (ARMC ONLY)
BILIRUBIN URINE: NEGATIVE
Bacteria, UA: NONE SEEN
GLUCOSE, UA: NEGATIVE mg/dL
Ketones, ur: NEGATIVE mg/dL
LEUKOCYTES UA: NEGATIVE
Nitrite: NEGATIVE
Protein, ur: 30 mg/dL — AB
SQUAMOUS EPITHELIAL / LPF: NONE SEEN
Specific Gravity, Urine: 1.02 (ref 1.005–1.030)
pH: 7 (ref 5.0–8.0)

## 2014-11-20 LAB — GLUCOSE, CAPILLARY
Glucose-Capillary: 112 mg/dL — ABNORMAL HIGH (ref 65–99)
Glucose-Capillary: 115 mg/dL — ABNORMAL HIGH (ref 65–99)
Glucose-Capillary: 94 mg/dL (ref 65–99)
Glucose-Capillary: 99 mg/dL (ref 65–99)

## 2014-11-20 LAB — BRAIN NATRIURETIC PEPTIDE: B Natriuretic Peptide: 7 pg/mL (ref 0.0–100.0)

## 2014-11-20 MED ORDER — FENTANYL BOLUS VIA INFUSION
25.0000 ug | INTRAVENOUS | Status: DC | PRN
  Filled 2014-11-20: qty 50

## 2014-11-20 MED ORDER — DEXTROSE 5 % IV SOLN
2.0000 g | Freq: Three times a day (TID) | INTRAVENOUS | Status: DC
  Administered 2014-11-20: 2 g via INTRAVENOUS
  Filled 2014-11-20 (×4): qty 2

## 2014-11-20 MED ORDER — VANCOMYCIN HCL 10 G IV SOLR
1500.0000 mg | Freq: Three times a day (TID) | INTRAVENOUS | Status: DC
  Administered 2014-11-20 – 2014-11-21 (×3): 1500 mg via INTRAVENOUS
  Filled 2014-11-20 (×6): qty 1500

## 2014-11-20 MED ORDER — PROPOFOL 1000 MG/100ML IV EMUL
0.0000 ug/kg/min | INTRAVENOUS | Status: DC
  Administered 2014-11-20: 20 ug/kg/min via INTRAVENOUS
  Administered 2014-11-20: 25 ug/kg/min via INTRAVENOUS
  Administered 2014-11-20: 20 ug/kg/min via INTRAVENOUS
  Administered 2014-11-21 (×2): 25 ug/kg/min via INTRAVENOUS
  Filled 2014-11-20 (×4): qty 100

## 2014-11-20 MED ORDER — FUROSEMIDE 10 MG/ML IJ SOLN
40.0000 mg | Freq: Two times a day (BID) | INTRAMUSCULAR | Status: AC
  Administered 2014-11-20 (×2): 40 mg via INTRAVENOUS
  Filled 2014-11-20 (×2): qty 4

## 2014-11-20 MED ORDER — POTASSIUM CHLORIDE 20 MEQ/15ML (10%) PO SOLN
40.0000 meq | Freq: Two times a day (BID) | ORAL | Status: AC
  Administered 2014-11-20 – 2014-11-21 (×2): 40 meq
  Filled 2014-11-20 (×3): qty 30

## 2014-11-20 MED ORDER — BISACODYL 10 MG RE SUPP
10.0000 mg | Freq: Once | RECTAL | Status: AC
  Administered 2014-11-20: 10 mg via RECTAL
  Filled 2014-11-20: qty 1

## 2014-11-20 MED ORDER — FENTANYL BOLUS VIA INFUSION: 25050.0000 ug | INTRAVENOUS | Status: DC | PRN

## 2014-11-20 MED ORDER — FENTANYL CITRATE (PF) 100 MCG/2ML IJ SOLN: 25.0000 ug | INTRAMUSCULAR | Status: DC | PRN

## 2014-11-20 MED ORDER — VANCOMYCIN HCL 10 G IV SOLR
1500.0000 mg | INTRAVENOUS | Status: AC
Start: 2014-11-20 — End: 2014-11-20
  Administered 2014-11-20: 1500 mg via INTRAVENOUS
  Filled 2014-11-20: qty 1500

## 2014-11-20 MED ORDER — MIDAZOLAM HCL 2 MG/2ML IJ SOLN
1.0000 mg | INTRAMUSCULAR | Status: DC | PRN
  Administered 2014-11-20 – 2014-11-21 (×3): 2 mg via INTRAVENOUS
  Filled 2014-11-20 (×3): qty 2

## 2014-11-20 MED ORDER — BISACODYL 10 MG RE SUPP
10.0000 mg | Freq: Every day | RECTAL | Status: DC | PRN
  Filled 2014-11-20: qty 1

## 2014-11-20 MED ORDER — PIPERACILLIN-TAZOBACTAM 4.5 G IVPB
4.5000 g | Freq: Three times a day (TID) | INTRAVENOUS | Status: DC
  Administered 2014-11-20 – 2014-11-21 (×4): 4.5 g via INTRAVENOUS
  Filled 2014-11-20 (×7): qty 100

## 2014-11-20 MED ORDER — PROPOFOL 1000 MG/100ML IV EMUL
INTRAVENOUS | Status: AC
  Filled 2014-11-20: qty 100

## 2014-11-20 NOTE — Consult Note (Signed)
  Psychiatry: Came by to follow-up with this patient. He is still intubated. No psychiatric intervention possible. I will follow-up as needed.

## 2014-11-20 NOTE — Progress Notes (Signed)
ANTIBIOTIC CONSULT NOTE - INITIAL  Pharmacy Consult for Vancomycin/Ceftazidime Indication: Sepsis  Allergies  Allergen Reactions  . Sulfa Antibiotics Other (See Comments)    Reaction:  Unknown     Patient Measurements: Height: 5\' 11"  (180.3 cm) Weight: (!) 309 lb 1.4 oz (140.2 kg) IBW/kg (Calculated) : 75.3 Adjusted Body Weight: 101.3 kg  Vital Signs: Temp: 100.7 F (38.2 C) (09/14 0700) Temp Source: Oral (09/14 0700) BP: 128/68 mmHg (09/14 0800) Pulse Rate: 105 (09/14 0800) Intake/Output from previous day: 09/13 0701 - 09/14 0700 In: 2775.5 [I.V.:1150.6; NG/GT:1524.8; IV Piggyback:100] Out: 6565 [Urine:6565] Intake/Output from this shift: Total I/O In: 21 [I.V.:21] Out: -   Labs:  Recent Labs  11/18/14 0601 11/19/14 0444 11/20/14 0442  WBC 9.2 11.3* 14.7*  HGB 11.3* 12.3* 12.7*  PLT 183 196 217  CREATININE 0.57* 0.73 0.71   Estimated Creatinine Clearance: 204 mL/min (by C-G formula based on Cr of 0.71). No results for input(s): VANCOTROUGH, VANCOPEAK, VANCORANDOM, GENTTROUGH, GENTPEAK, GENTRANDOM, TOBRATROUGH, TOBRAPEAK, TOBRARND, AMIKACINPEAK, AMIKACINTROU, AMIKACIN in the last 72 hours.   Microbiology: Recent Results (from the past 720 hour(s))  MRSA PCR Screening     Status: None   Collection Time: 11/13/14  3:40 AM  Result Value Ref Range Status   MRSA by PCR NEGATIVE NEGATIVE Final    Comment:        The GeneXpert MRSA Assay (FDA approved for NASAL specimens only), is one component of a comprehensive MRSA colonization surveillance program. It is not intended to diagnose MRSA infection nor to guide or monitor treatment for MRSA infections.   Culture, bal-quantitative     Status: None   Collection Time: 11/15/14  9:53 AM  Result Value Ref Range Status   Specimen Description BRONCHIAL ALVEOLAR LAVAGE  Final   Special Requests NONE  Final   Gram Stain   Final    MANY WBC SEEN FEW GRAM POSITIVE COCCI RARE GRAM POSITIVE RODS EXCELLENT SPECIMEN  - 90-100% WBCS    Culture MODERATE GROWTH STAPHYLOCOCCUS AUREUS  Final   Report Status 11/18/2014 FINAL  Final   Organism ID, Bacteria STAPHYLOCOCCUS AUREUS  Final      Susceptibility   Staphylococcus aureus - MIC*    CIPROFLOXACIN Value in next row Sensitive      SENSITIVE<=0.5    ERYTHROMYCIN Value in next row Sensitive      SENSITIVE<=0.25    GENTAMICIN Value in next row Sensitive      SENSITIVE<=0.5    OXACILLIN Value in next row Sensitive      SENSITIVE<=0.25    TETRACYCLINE Value in next row Sensitive      SENSITIVE<=1    CLINDAMYCIN Value in next row Sensitive      SENSITIVE<=0.25    CEFOXITIN SCREEN Value in next row Sensitive      SENSITIVE<=0.25    Inducible Clindamycin Value in next row Sensitive      SENSITIVE<=0.25    LINEZOLID Value in next row Sensitive      SENSITIVE2    TRIMETH/SULFA Value in next row Sensitive      SENSITIVE<=10    * MODERATE GROWTH STAPHYLOCOCCUS AUREUS    Medical History: Past Medical History  Diagnosis Date  . Thyroid disease     Medications:  Scheduled:  . antiseptic oral rinse  7 mL Mouth Rinse QID  . bisacodyl  10 mg Rectal Once  . cefTAZidime (FORTAZ)  IV  2 g Intravenous Q8H  . chlorhexidine gluconate  15 mL Mouth Rinse BID  .  clonazePAM  1 mg Per Tube BID  . feeding supplement (PRO-STAT SUGAR FREE 64)  30 mL Oral QID  . feeding supplement (VITAL HIGH PROTEIN)  1,000 mL Per Tube Q24H  . [START ON 11/21/2014] fentaNYL  100 mcg Transdermal Q72H  . free water  100 mL Per Tube 6 times per day  . furosemide  40 mg Intravenous BID  . heparin  5,000 Units Subcutaneous 3 times per day  . Influenza vac split quadrivalent PF  0.5 mL Intramuscular Tomorrow-1000  . levothyroxine  75 mcg Per Tube QAC breakfast  . pantoprazole sodium  40 mg Per Tube Q1200  . potassium chloride  40 mEq Per Tube BID  . propofol      . QUEtiapine  200 mg Per Tube BID  . senna-docusate  1 tablet Per Tube BID  . vancomycin  1,500 mg Intravenous STAT  .  vancomycin  1,500 mg Intravenous Q8H   Infusions:  . dextrose 5 % with kcl 50 mL/hr at 11/20/14 0513  . propofol (DIPRIVAN) infusion 25 mcg/kg/min (11/20/14 0933)   PRN: acetaminophen **OR** [DISCONTINUED] acetaminophen, bisacodyl, fentaNYL, ipratropium-albuterol, midazolam  Assessment: [name] admitted with encephalopathy and respiratory failure from benzodiazepine OD on ceftriaxone for PNA. Patient spiking fevers to broaden antibiotics with vancomycin and ceftazidime for sepsis.   Ke: 0.104 Vd: 70.9    Goal of Therapy:  Vancomycin trough level 15-20 mcg/ml  Plan:  1. Vancomycin 1500 mg iv q 8 hours with stacked dosing and a trough with the 4th dose.   2. Ceftazidime 2 g iv q 8 hours.   Will f/u renal function, culture results, and check levels as clinically indicated.  [name] D 11/20/2014,9:39 AM

## 2014-11-20 NOTE — Progress Notes (Signed)
[facility] - [place] at [facility]   PATIENT NAME: [name]    MR#:  [id number]  DATE OF BIRTH:  [birth year]  SUBJECTIVE: 24 year old male patient admitted for acute respiratory failure secondary to possible drug overdose.extubated, reintubated again secondary to pneumonia.  CHIEF COMPLAINT:   Chief Complaint  Patient presents with  . Drug Overdose   back on propofol this morning as was getting more agitated. Also on Fentanyl 100 mcg/hr Patch, worsening leukocytosis, getting febrile over last 48 hrs  REVIEW OF SYSTEMS:   Review of Systems  Unable to perform ROS: intubated   DRUG ALLERGIES:   Allergies  Allergen Reactions  . Sulfa Antibiotics Other (See Comments)    Reaction:  Unknown    VITALS:  Blood pressure 123/68, pulse 98, temperature 99.9 F (37.7 C), temperature source Oral, resp. rate 20, height 5\' 11"  (1.803 m), weight 309 lb 1.4 oz (140.2 kg), SpO2 90 %. PHYSICAL EXAMINATION:  Physical Exam  Constitutional: He is well-developed, well-nourished, and in no distress.  HENT:  Head: Normocephalic and atraumatic.  Endotracheal tube in place  Eyes: Conjunctivae and EOM are normal. Pupils are equal, round, and reactive to light.  Neck: Normal range of motion. Neck supple. No tracheal deviation present. No thyromegaly present.  Cardiovascular: Normal rate, regular rhythm and normal heart sounds.   Pulmonary/Chest: Effort normal and breath sounds normal. No respiratory distress. He has no wheezes. He exhibits no tenderness.  Abdominal: Soft. Bowel sounds are normal. He exhibits no distension. There is no tenderness.  Musculoskeletal: Normal range of motion.  Neurological:  Unable to assess due to sedation on ventilator  Skin: Skin is warm and dry. No rash noted.  Psychiatric:  Unable to assess due to sedation on ventilator   LABORATORY PANEL:   CBC  Recent Labs Lab 11/20/14 0442  WBC 14.7*  HGB 12.7*  HCT 38.7*  PLT 217    ------------------------------------------------------------------------------------------------------------------  Chemistries   Recent Labs Lab 11/19/14 0444 11/20/14 0442  NA 148* 144  K 4.0 3.8  CL 110 102  CO2 33* 33*  GLUCOSE 107* 118*  BUN 13 17  CREATININE 0.73 0.71  CALCIUM 8.4* 8.2*  MG 2.2  --   AST 50*  --   ALT 34  --   ALKPHOS 72  --   BILITOT 0.7  --    RADIOLOGY:  Dg Chest Port 1 View  11/20/2014   CLINICAL DATA:  PICC line placement  EXAM: PORTABLE CHEST - 1 VIEW  COMPARISON:  11/20/2014  FINDINGS: Left PICC line is in place with the tip at the cavoatrial junction. Endotracheal tube, right central line and NG tube are unchanged. Improving aeration in the lungs with decreasing bilateral airspace disease. Low lung volumes with bibasilar atelectasis and mild vascular congestion. No effusions.  IMPRESSION: Left PICC line tip at the cavoatrial junction.  Improving bilateral airspace disease. Continued low volumes, vascular congestion and bibasilar atelectasis.   Electronically Signed   By: Charlett Nose M.D.   On: 11/20/2014 15:30   Dg Chest Port 1 View  11/20/2014   CLINICAL DATA:  Hypoxia  EXAM: PORTABLE CHEST - 1 VIEW  COMPARISON:  November 19, 2014  FINDINGS: Endotracheal tube tip is at the carina directed toward the right main bronchus. Central catheter tip is at the cavoatrial junction. Nasogastric tube tip and side port are in the stomach. No pneumothorax. There is moderate alveolar edema bilaterally. Heart is upper normal in size with pulmonary vascularity within normal  limits. No adenopathy.  IMPRESSION: Tube and catheter positions as described without pneumothorax. Note that the endotracheal tube tip is at the carina directed toward the right main bronchus. Advise withdrawing endotracheal tube approximately 4 cm. There is slight increase in alveolar edema bilaterally. No change in cardiac silhouette.  Critical Value/emergent results were called by telephone at the  time of interpretation on 11/20/2014 at 8:01 am to [facility], RN, who verbally acknowledged these results.   Electronically Signed   By: Bretta Bang III M.D.   On: 11/20/2014 08:01   Dg Chest Port 1 View  11/19/2014   CLINICAL DATA:  Ventilator patient.  Respiratory failure.  EXAM: PORTABLE CHEST - 1 VIEW  COMPARISON:  11/17/2014 and 11/16/2014.  FINDINGS: 0629 hours. The endotracheal tube is not significantly changed, tip 1.3 cm above the carina. Nasogastric tube projects below the diaphragm. Right IJ central venous catheter extends to the lower SVC level. The heart size and mediastinal contours are stable. There is mildly improved aeration of both lung bases with residual patchy left-greater-than-right pulmonary opacities. No pneumothorax or significant pleural effusion identified.  IMPRESSION: The endotracheal tube remains low, within 2 cm of the carina. Interval improved aeration of both lung bases.   Electronically Signed   By: Carey Bullocks M.D.   On: 11/19/2014 08:17   ASSESSMENT AND PLAN:  1. Acute hypoxic respiratory failure; intubated on admission and was extubated briefly on 9/8 but reintubated on 9/9, status post  bronchoscopy on September 9, cultures from bronchoscopy showing methicillin sensitive staph aureus   9/7>>found down, obtunded, intubated in [facility] ED 9/8>>extubated 9/9>>reintubated for respiratory distress, CVL placed, bronch with copious secretions  2.  Aspiration pneumonia: was clindamycin 9/9- 9/10, then vanco and zosyn 9/10-11. Cx from sputum with MSSA so changed to ceftriaxone 9/13. Currently decompensating (fever over last 48 hrs and worsening leukocytosis) so broadened to vanco and ceftazidime - ID recommends stopping vanco if repeat sputum & blood c/s neg. Changed ceftazidime to zosyn for better anaerobic coverage  3. Bipolar disorder: meds to be restarted after extubation per psych eval  4.  Hypernatremia: monitor, given 1 time dose of lasix today on  9/13 - should help. On D5W  5. possible suicidal attempt;with xanax; ivc, psych consult, Patient is going through  Bereavement as he lost his mother  recently and having hard time with that   INDWELLING DEVICES: R radial A-line 9/11 - 9/12 R IJ CVL 9/09 - 9/14 - plan for removal today (9/14) and tip for c/s due to ongoing fever and worsening leukocytosis ETT 9/06 >> 9/09, 9/09 >>  PICC ordered 9/14 >> just placed.  Sitter, psych consult after extubation.  * Has autism, bipolar disorder, PTSD. With his recent loss of the mother patient is more depressed. Dad is concerned that he may have taken in an attempted suicide, took Xanax 2 mg tablets 12 of them.    All the records are reviewed and case discussed with Care Management/Social Worker. Management plans discussed with the nursing and Dr. Sung Amabile and they are in agreement.  CODE STATUS: Full code  TOTAL TIME (Critical Care)TAKING CARE OF THIS PATIENT: 35 minutes.   More than 50% of the time was spent in counseling/coordination of care: YES. (Case discussed with elder sister Gigi Gin on phone # [phone])  Delfino Lovett M.D on 11/20/2014 at 4:14 PM  Between 7am to 6pm - Pager - [phone]  After 6pm go to [email or web address] - password [email or web address] [facility]  Office  [phone]  CC: Primary care physician; Derwood Kaplan, MD

## 2014-11-20 NOTE — Progress Notes (Signed)
Spoke to Dr. Sherryll Burger- Updated on Patient having temp of 100.7- which as been around that point past couple days- blood cultures ordered- Chest xray showing slight increase of pulmonary edema- pink secretions from ET tube when suctioning- no new orders.  MD placed order for ID and UA.

## 2014-11-20 NOTE — Consult Note (Signed)
[facility] Infectious Disease     Reason for Consult: Sepsis   Referring Physician: Merton Border Date of Admission:  11/12/2014   Principal Problem:   Acute respiratory failure with hypoxia Active Problems:   Overdose   Aspiration pneumonia   Autism   Bipolar 1 disorder   ARDS (adult respiratory distress syndrome)   Severe sepsis with acute organ dysfunction   HPI: [name] is a 24 y.o. male with hx substance abuse admitted with AMS after presumed overdose.  He was intubated in ED.  Initially afebrile and had nml wbc however has developed fevers, leukocytosis.  Was extubated briefly 9/8 but then reintubated. Started on clindamycin 9/9- 9/10, then vanco and zosyn 9/10-11.  Cx from sputum with MSSA so changed to ceftriaxone 9/13.   Since worsening some changed to vanco and ceftazidime 9/14.   Currently with thick sputum via ETT.   Past Medical History  Diagnosis Date  . Thyroid disease    History reviewed. No pertinent past surgical history. Social History  Substance Use Topics  . Smoking status: Current Some Day Smoker -- 1.00 packs/day    Types: Cigarettes  . Smokeless tobacco: None  . Alcohol Use: No   No family history on file.  Allergies:  Allergies  Allergen Reactions  . Sulfa Antibiotics Other (See Comments)    Reaction:  Unknown     Current antibiotics: Antibiotics Given (last 72 hours)    Date/Time Action Medication Dose Rate   11/17/14 1739 Given   vancomycin (VANCOCIN) 1,250 mg in sodium chloride 0.9 % 250 mL IVPB 1,250 mg 166.7 mL/hr   11/17/14 2249 Given   piperacillin-tazobactam (ZOSYN) IVPB 3.375 g 3.375 g 12.5 mL/hr   11/18/14 0248 Given   vancomycin (VANCOCIN) 1,250 mg in sodium chloride 0.9 % 250 mL IVPB 1,250 mg 166.7 mL/hr   11/18/14 0524 Given   piperacillin-tazobactam (ZOSYN) IVPB 3.375 g 3.375 g 12.5 mL/hr   11/18/14 1234 Given   cefTRIAXone (ROCEPHIN) 1 g in dextrose 5 % 50 mL IVPB 1 g 100 mL/hr   11/19/14 1158 Given    cefTRIAXone (ROCEPHIN) 1 g in dextrose 5 % 50 mL IVPB 1 g 100 mL/hr   11/19/14 1630 Given   cefTRIAXone (ROCEPHIN) 1 g in dextrose 5 % 50 mL IVPB 1 g 100 mL/hr   11/20/14 1000 Given   cefTAZidime (FORTAZ) 2 g in dextrose 5 % 50 mL IVPB 2 g 100 mL/hr      MEDICATIONS: . antiseptic oral rinse  7 mL Mouth Rinse QID  . cefTAZidime (FORTAZ)  IV  2 g Intravenous Q8H  . chlorhexidine gluconate  15 mL Mouth Rinse BID  . clonazePAM  1 mg Per Tube BID  . feeding supplement (PRO-STAT SUGAR FREE 64)  30 mL Oral QID  . feeding supplement (VITAL HIGH PROTEIN)  1,000 mL Per Tube Q24H  . [START ON 11/21/2014] fentaNYL  100 mcg Transdermal Q72H  . free water  100 mL Per Tube 6 times per day  . furosemide  40 mg Intravenous BID  . heparin  5,000 Units Subcutaneous 3 times per day  . Influenza vac split quadrivalent PF  0.5 mL Intramuscular Tomorrow-1000  . levothyroxine  75 mcg Per Tube QAC breakfast  . pantoprazole sodium  40 mg Per Tube Q1200  . potassium chloride  40 mEq Per Tube BID  . QUEtiapine  200 mg Per Tube BID  . senna-docusate  1 tablet Per Tube BID  . vancomycin  1,500  mg Intravenous STAT  . vancomycin  1,500 mg Intravenous Q8H    Review of Systems - unable to obtain    OBJECTIVE: Temp:  [99.9 F (37.7 C)-100.7 F (38.2 C)] 99.9 F (37.7 C) (09/14 1300) Pulse Rate:  [94-112] 98 (09/14 1100) Resp:  [19-20] 20 (09/14 1100) BP: (103-129)/(53-78) 123/68 mmHg (09/14 1100) SpO2:  [90 %-98 %] 90 % (09/14 1100) FiO2 (%):  [50 %] 50 % (09/14 1220) Weight:  [140.2 kg (309 lb 1.4 oz)] 140.2 kg (309 lb 1.4 oz) (09/14 0422) Physical Exam  Constitutional: morbidly obese, ill appearing, intubated. Thick bloody secretions in tube HENT: PERRLA, anicteric Mouth/Throat: Oropharynx ETT in place Cardiovascular: Normal rate, regular rhythm and normal heart sounds. Pulmonary/Chest: rhonhci bil Abdominal: Soft. Bowel sounds are normal. He exhibits no distension. There is no tenderness.   Lymphadenopathy: He has no cervical adenopathy.  Neurological: intubated, sedated on propofol Skin: Skin is warm and dry. Multiple acne like lesions on chest Psychiatric: sedated EXT 1+ edema bil PICC RUE  LABS: Results for orders placed or performed during the hospital encounter of 11/12/14 (from the past 48 hour(s))  Glucose, capillary     Status: Abnormal   Collection Time: 11/19/14 12:04 AM  Result Value Ref Range   Glucose-Capillary 105 (H) 65 - 99 mg/dL  Magnesium     Status: None   Collection Time: 11/19/14  4:44 AM  Result Value Ref Range   Magnesium 2.2 1.7 - 2.4 mg/dL  Phosphorus     Status: None   Collection Time: 11/19/14  4:44 AM  Result Value Ref Range   Phosphorus 3.9 2.5 - 4.6 mg/dL  CBC     Status: Abnormal   Collection Time: 11/19/14  4:44 AM  Result Value Ref Range   WBC 11.3 (H) 3.8 - 10.6 K/uL   RBC 4.11 (L) 4.40 - 5.90 MIL/uL   Hemoglobin 12.3 (L) 13.0 - 18.0 g/dL   HCT 37.2 (L) 40.0 - 52.0 %   MCV 90.5 80.0 - 100.0 fL   MCH 30.0 26.0 - 34.0 pg   MCHC 33.1 32.0 - 36.0 g/dL   RDW 14.6 (H) 11.5 - 14.5 %   Platelets 196 150 - 440 K/uL  Comprehensive metabolic panel     Status: Abnormal   Collection Time: 11/19/14  4:44 AM  Result Value Ref Range   Sodium 148 (H) 135 - 145 mmol/L   Potassium 4.0 3.5 - 5.1 mmol/L   Chloride 110 101 - 111 mmol/L   CO2 33 (H) 22 - 32 mmol/L   Glucose, Bld 107 (H) 65 - 99 mg/dL   BUN 13 6 - 20 mg/dL   Creatinine, Ser 0.73 0.61 - 1.24 mg/dL   Calcium 8.4 (L) 8.9 - 10.3 mg/dL   Total Protein 5.8 (L) 6.5 - 8.1 g/dL   Albumin 2.5 (L) 3.5 - 5.0 g/dL   AST 50 (H) 15 - 41 U/L   ALT 34 17 - 63 U/L   Alkaline Phosphatase 72 38 - 126 U/L   Total Bilirubin 0.7 0.3 - 1.2 mg/dL   GFR calc non Af Amer >60 >60 mL/min   GFR calc Af Amer >60 >60 mL/min    Comment: (NOTE) The eGFR has been calculated using the CKD EPI equation. This calculation has not been validated in all clinical situations. eGFR's persistently <60 mL/min  signify possible Chronic Kidney Disease.    Anion gap 5 5 - 15  Brain natriuretic peptide     Status: None  Collection Time: 11/19/14  4:59 AM  Result Value Ref Range   B Natriuretic Peptide 30.0 0.0 - 100.0 pg/mL  Glucose, capillary     Status: None   Collection Time: 11/20/14 12:07 AM  Result Value Ref Range   Glucose-Capillary 99 65 - 99 mg/dL  Basic metabolic panel     Status: Abnormal   Collection Time: 11/20/14  4:42 AM  Result Value Ref Range   Sodium 144 135 - 145 mmol/L   Potassium 3.8 3.5 - 5.1 mmol/L   Chloride 102 101 - 111 mmol/L   CO2 33 (H) 22 - 32 mmol/L   Glucose, Bld 118 (H) 65 - 99 mg/dL   BUN 17 6 - 20 mg/dL   Creatinine, Ser 0.71 0.61 - 1.24 mg/dL   Calcium 8.2 (L) 8.9 - 10.3 mg/dL   GFR calc non Af Amer >60 >60 mL/min   GFR calc Af Amer >60 >60 mL/min    Comment: (NOTE) The eGFR has been calculated using the CKD EPI equation. This calculation has not been validated in all clinical situations. eGFR's persistently <60 mL/min signify possible Chronic Kidney Disease.    Anion gap 9 5 - 15  CBC     Status: Abnormal   Collection Time: 11/20/14  4:42 AM  Result Value Ref Range   WBC 14.7 (H) 3.8 - 10.6 K/uL   RBC 4.32 (L) 4.40 - 5.90 MIL/uL   Hemoglobin 12.7 (L) 13.0 - 18.0 g/dL   HCT 38.7 (L) 40.0 - 52.0 %   MCV 89.6 80.0 - 100.0 fL   MCH 29.4 26.0 - 34.0 pg   MCHC 32.9 32.0 - 36.0 g/dL   RDW 14.2 11.5 - 14.5 %   Platelets 217 150 - 440 K/uL  Triglycerides     Status: Abnormal   Collection Time: 11/20/14  8:25 AM  Result Value Ref Range   Triglycerides 204 (H) <150 mg/dL  Urinalysis complete, with microscopic ([facility] only)     Status: Abnormal   Collection Time: 11/20/14  9:43 AM  Result Value Ref Range   Color, Urine YELLOW (A) YELLOW   APPearance CLEAR (A) CLEAR   Glucose, UA NEGATIVE NEGATIVE mg/dL   Bilirubin Urine NEGATIVE NEGATIVE   Ketones, ur NEGATIVE NEGATIVE mg/dL   Specific Gravity, Urine 1.020 1.005 - 1.030   Hgb urine dipstick 2+  (A) NEGATIVE   pH 7.0 5.0 - 8.0   Protein, ur 30 (A) NEGATIVE mg/dL   Nitrite NEGATIVE NEGATIVE   Leukocytes, UA NEGATIVE NEGATIVE   RBC / HPF TOO NUMEROUS TO COUNT 0 - 5 RBC/hpf   WBC, UA 0-5 0 - 5 WBC/hpf   Bacteria, UA NONE SEEN NONE SEEN   Squamous Epithelial / LPF NONE SEEN NONE SEEN   Mucous PRESENT   Glucose, capillary     Status: Abnormal   Collection Time: 11/20/14 12:59 PM  Result Value Ref Range   Glucose-Capillary 115 (H) 65 - 99 mg/dL   No components found for: ESR, C REACTIVE PROTEIN MICRO: Recent Results (from the past 720 hour(s))  MRSA PCR Screening     Status: None   Collection Time: 11/13/14  3:40 AM  Result Value Ref Range Status   MRSA by PCR NEGATIVE NEGATIVE Final    Comment:        The GeneXpert MRSA Assay (FDA approved for NASAL specimens only), is one component of a comprehensive MRSA colonization surveillance program. It is not intended to diagnose MRSA infection nor to guide or  monitor treatment for MRSA infections.   Culture, bal-quantitative     Status: None   Collection Time: 11/15/14  9:53 AM  Result Value Ref Range Status   Specimen Description BRONCHIAL ALVEOLAR LAVAGE  Final   Special Requests NONE  Final   Gram Stain   Final    MANY WBC SEEN FEW GRAM POSITIVE COCCI RARE GRAM POSITIVE RODS EXCELLENT SPECIMEN - 90-100% WBCS    Culture MODERATE GROWTH STAPHYLOCOCCUS AUREUS  Final   Report Status 11/18/2014 FINAL  Final   Organism ID, Bacteria STAPHYLOCOCCUS AUREUS  Final      Susceptibility   Staphylococcus aureus - MIC*    CIPROFLOXACIN Value in next row Sensitive      SENSITIVE<=0.5    ERYTHROMYCIN Value in next row Sensitive      SENSITIVE<=0.25    GENTAMICIN Value in next row Sensitive      SENSITIVE<=0.5    OXACILLIN Value in next row Sensitive      SENSITIVE<=0.25    TETRACYCLINE Value in next row Sensitive      SENSITIVE<=1    CLINDAMYCIN Value in next row Sensitive      SENSITIVE<=0.25    CEFOXITIN SCREEN Value in  next row Sensitive      SENSITIVE<=0.25    Inducible Clindamycin Value in next row Sensitive      SENSITIVE<=0.25    LINEZOLID Value in next row Sensitive      SENSITIVE2    TRIMETH/SULFA Value in next row Sensitive      SENSITIVE<=10    * MODERATE GROWTH STAPHYLOCOCCUS AUREUS    IMAGING: Dg Chest 1 View  11/16/2014   CLINICAL DATA:  Shortness of breath  EXAM: CHEST  1 VIEW  COMPARISON:  Yesterday  FINDINGS: Endotracheal tube remains in good position. The orogastric tube reaches the stomach at least. Right IJ central line, tip at the upper cavoatrial junction.  Although left-sided airspace opacity is diminished, right-sided opacity is increased. Stable normal heart size and mediastinal contours. No air leak.  IMPRESSION: 1. Improved left and increased right airspace disease, now symmetric. Pulmonary edema (likely noncardiogenic) and widespread aspiration pneumonia could coexist. 2. Stable positioning of tubes and central line.   Electronically Signed   By: Monte Fantasia M.D.   On: 11/16/2014 12:10   Ct Head Wo Contrast  11/13/2014   CLINICAL DATA:  History drug overdose. Unresponsive. Found down on floor. Difficult to arouse.  EXAM: CT HEAD WITHOUT CONTRAST  TECHNIQUE: Contiguous axial images were obtained from the base of the skull through the vertex without intravenous contrast.  COMPARISON:  04/19/2013  FINDINGS: Ventricles and sulci appear symmetrical. No mass effect or midline shift. No abnormal extra-axial fluid collections. Gray-white matter junctions are distinct. Basal cisterns are not effaced. No evidence of acute intracranial hemorrhage. No depressed skull fractures. Visualized paranasal sinuses and mastoid air cells are not opacified.  IMPRESSION: No acute intracranial abnormalities.   Electronically Signed   By: Lucienne Capers M.D.   On: 11/13/2014 00:50   Dg Chest Port 1 View  11/20/2014   CLINICAL DATA:  Hypoxia  EXAM: PORTABLE CHEST - 1 VIEW  COMPARISON:  November 19, 2014   FINDINGS: Endotracheal tube tip is at the carina directed toward the right main bronchus. Central catheter tip is at the cavoatrial junction. Nasogastric tube tip and side port are in the stomach. No pneumothorax. There is moderate alveolar edema bilaterally. Heart is upper normal in size with pulmonary vascularity within normal limits. No adenopathy.  IMPRESSION: Tube and catheter positions as described without pneumothorax. Note that the endotracheal tube tip is at the carina directed toward the right main bronchus. Advise withdrawing endotracheal tube approximately 4 cm. There is slight increase in alveolar edema bilaterally. No change in cardiac silhouette.  Critical Value/emergent results were called by telephone at the time of interpretation on 11/20/2014 at 8:01 am to [place], RN, who verbally acknowledged these results.   Electronically Signed   By: Lowella Grip III M.D.   On: 11/20/2014 08:01   Dg Chest Port 1 View  11/19/2014   CLINICAL DATA:  Ventilator patient.  Respiratory failure.  EXAM: PORTABLE CHEST - 1 VIEW  COMPARISON:  11/17/2014 and 11/16/2014.  FINDINGS: 0629 hours. The endotracheal tube is not significantly changed, tip 1.3 cm above the carina. Nasogastric tube projects below the diaphragm. Right IJ central venous catheter extends to the lower SVC level. The heart size and mediastinal contours are stable. There is mildly improved aeration of both lung bases with residual patchy left-greater-than-right pulmonary opacities. No pneumothorax or significant pleural effusion identified.  IMPRESSION: The endotracheal tube remains low, within 2 cm of the carina. Interval improved aeration of both lung bases.   Electronically Signed   By: Richardean Sale M.D.   On: 11/19/2014 08:17   Dg Chest Port 1 View  11/17/2014   CLINICAL DATA:  Respiratory failure.  EXAM: PORTABLE CHEST - 1 VIEW  COMPARISON:  11/16/2014  FINDINGS: Low lying ET tube with tip at the level of the carina. Consider  withdrawing by 1 and half to 2 cm. The right IJ catheter tip is in the right atrium. Low lung volumes. Diffuse pulmonary edema is stable to slightly improved in the interval.  IMPRESSION: 1. Low lying endotracheal tube. Consider withdrawing by 1.5 to 2 cm. 2. Stable to improved aeration to the lungs.   Electronically Signed   By: Kerby Moors M.D.   On: 11/17/2014 09:20   Dg Chest Port 1 View  11/15/2014   CLINICAL DATA:  Central line NG tube placement.  EXAM: PORTABLE CHEST - 1 VIEW  COMPARISON:  11/13/2014.  FINDINGS: Interim placement of right IJ line, its tip is in the upper portion the right atrium. Interim placement NG tube, its tip is below left hemidiaphragm. Endotracheal tube tip 12 mm above the carina . Cardiomegaly. Bilateral pulmonary alveolar infiltrates, left side greater right. Findings suggest congestive heart failure pulmonary edema. Bilateral pneumonia cannot be excluded. No pleural effusion or pneumothorax .  IMPRESSION: 1. Interim placement of right IJ line, its tip is projected over the upper right atrium. NG tube placement with tip below left hemidiaphragm. Endotracheal tube tip 12 mm above the carina. 2. Cardiomegaly with bilateral pulmonary infiltrates left side greater right. Findings consistent congestive heart failure pulmonary edema. Bilateral pneumonia cannot be excluded.   Electronically Signed   By: Marcello Moores  Register   On: 11/15/2014 09:17   Dg Chest Portable 1 View  11/13/2014   CLINICAL DATA:  Post intubation  EXAM: PORTABLE CHEST - 1 VIEW  COMPARISON:  06/30/2009  FINDINGS: Interval placement of an endotracheal tube with tip measuring 3.2 cm above the carina. Shallow inspiration. Heart size and pulmonary vascularity are normal for technique. No focal airspace disease or consolidation in the lungs. No blunting of costophrenic angles. No pneumothorax.  IMPRESSION: Endotracheal tube tip measures 3.2 cm above the carina. Shallow inspiration. No evidence of active pulmonary disease.    Electronically Signed   By: Oren Beckmann.D.  On: 11/13/2014 02:19   Dg Abd Portable 1v  11/15/2014   CLINICAL DATA:  Gastric tube placement  EXAM: PORTABLE ABDOMEN - 1 VIEW  COMPARISON:  CT abdomen 09/24/2014.  Chest x-ray 11/13/2014  FINDINGS: Gastric tube is coiled in the stomach. Gas is present in the colon which is nondilated. Small bowel nondilated. Normal bowel gas pattern. No renal calculi.  IMPRESSION: Gastric tube coiled in the stomach.  Normal bowel gas pattern.   Electronically Signed   By: Franchot Gallo M.D.   On: 11/15/2014 09:16    Assessment:   [name] is a 24 y.o. male with hx substance abuse admitted with AMS after presumed overdose.  He was intubated in ED.  Initially afebrile and had nml wbc however subsequently developed fevers, leukocytosis.  Was extubated briefly 9/8 but then reintubated. Started on clindamycin 9/9- 9/10, then vanco and zosyn 9/10-11.  Cx from sputum with MSSA so changed to ceftriaxone 9/13.  Currently decompensating so broadened to vanco and ceftazidime Fevers increasing, wbc up to 14.  Likely has aspiration PNA.  Prior sputum cx with MSSA. [place] 9/14 pending.  Recommendations Continue vanco but if fu sputum is negative and bcx negative in 48 hours would dc Change ceftazidime to zosyn for better anaerobic coverage for aspiration. Will also cover pseudomonal and other HCAP pathogens Await bcx.   Thank you very much for allowing me to participate in the care of this patient. Please call with questions.   Cheral Marker. Ola Spurr, MD

## 2014-11-20 NOTE — Progress Notes (Signed)
Updat Dr. Bard Herbert- about patient needing ET tube pulled back 4cm per Radiologist- chest xray showing that there is slight increase of pul.edema compared to the last xray- patient having thick pink-tinged secretions from tube- patient arousable to name- ID consult placed - patient having frequent temps in low 100's- blood cultures ordered- no BM since admission- UA ordered.  Per Dr. Sung Amabile- RT to pull back 3cm for ET tube and propofol drip ordered.- no extubation today.

## 2014-11-20 NOTE — Care Management Note (Signed)
Case Management Note  Patient Details  Name: [name] MRN: [id number] Date of Birth: 05-04-1990  Subjective/Objective:   Offered to look at [facility] for patient. Dr. Bard Herbert declined LTACH at this time.                  Action/Plan:   Expected Discharge Date:                  Expected Discharge Plan:     In-House Referral:  Clinical Social Work  Discharge planning Services  CM Consult  Post Acute Care Choice:    Choice offered to:     DME Arranged:    DME Agency:     HH Arranged:    HH Agency:     Status of Service:  In process, will continue to follow  Medicare Important Message Given:    Date Medicare IM Given:    Medicare IM give by:    Date Additional Medicare IM Given:    Additional Medicare Important Message give by:     If discussed at Long Length of Stay Meetings, dates discussed:    Additional Comments:  Marily Memos, RN 11/20/2014, 2:43 PM

## 2014-11-20 NOTE — Progress Notes (Signed)
Nutrition Follow-up    INTERVENTION:   EN: continue current TF regimen at present, Vital High Protein at 50 ml/hr with Prostat QID, pt currently getting additional 204 kcals from D5 infusion; diprivan restarted, will need to reassess goal rate on follow-up based on total kcals from diprivan Coordination of Care: discussed constipation during ICU rounds, per Charli RN orders for dulcolax suppository have been placed   NUTRITION DIAGNOSIS:   Inadequate oral intake related to acute illness as evidenced by NPO status. Being addressed via TF   GOAL:   Patient will meet greater than or equal to 90% of their needs  MONITOR:    (Energy Intake, Anthropometrics, Electrolyte/Renal Profile, Digestive System)  ASSESSMENT:    Pt remains on vent, sedation being adjusted, restarting diprivan  EN: tolerating Vital High Protein at rate of 50 ml/hr, Prostat 4 times daily  Digestive System: no signs of TF intolerance, abdomen remains soft, 0 mL residual, constipation  Last BM:   no BM since admission  Urine Volume: UOP 6.6 Liters in 24 hours  Meds: D5 at 50 ml/hr (204 kcals in 24 hours), diprivan restarted  Electrolyte and Renal Profile:  Recent Labs Lab 11/15/14 1059  11/16/14 0512  11/17/14 1744 11/18/14 0601 11/19/14 0444 11/20/14 0442  BUN 7  --  10  < >  --  CREATININE 0.67  --  0.90  < >  --  0.57* 0.73 0.71  NA 144  --  147*  < >  --  146* 148* 144  K 4.1  --  3.8  < > 3.2* 3.4* 4.0 3.8  MG  --   < > 1.9  --  2.0 1.9 2.2  --   PHOS 4.1  --  4.1  --   --   --  3.9  --   < > = values in this interval not displayed. Glucose Profile:  Recent Labs  11/18/14 1131 11/19/14 0004 11/20/14 0007  GLUCAP 81 105* 99   Height:   Ht Readings from Last 1 Encounters:  11/16/14  (1.803 m)    Weight:   Wt Readings from Last 1 Encounters:  11/20/14 309 lb 1.4 oz (140.2 kg)   Filed Weights   11/18/14 0455 11/19/14 0444 11/20/14 0422  Weight: 324 lb 4.8 oz  (147.1 kg) 315 lb 11.2 oz (143.2 kg) 309 lb 1.4 oz (140.2 kg)    BMI:  Body mass index is 43.13 kg/(m^2).  Estimated Nutritional Needs:   Kcal:  1610-9604 kcals   Protein:  140-175 g(2.0-2.5 g/kg) using IBW 70 kg  Fluid:  1750-2100 mL (25-30 ml/kg)   HIGH Care Level  Romelle Starcher MS, RD, LDN [phone] Pager

## 2014-11-20 NOTE — Progress Notes (Signed)
Pt would arouse to voice/stimulus at beginning of shift. At shift change started to wake up and become agitated, versed administered. Drs notes stated the pt only had 1 100 mg Fentanyl patch on, however, pt had 2 100 mg Fentanyl patches on. One 100 mg patch was removed and wasted. Temp remained 100 all night, otherwise VSS.

## 2014-11-20 NOTE — Progress Notes (Signed)
ANTIBIOTIC CONSULT NOTE - INITIAL  Pharmacy Consult for Zosyn  Indication: HCAP  Allergies  Allergen Reactions  . Sulfa Antibiotics Other (See Comments)    Reaction:  Unknown     Patient Measurements: Height: 5\' 11"  (180.3 cm) Weight: (!) 309 lb 1.4 oz (140.2 kg) IBW/kg (Calculated) : 75.3 Adjusted Body Weight:   Vital Signs: Temp: 99.9 F (37.7 C) (09/14 1300) Temp Source: Oral (09/14 1300) BP: 123/68 mmHg (09/14 1100) Pulse Rate: 98 (09/14 1100) Intake/Output from previous day: 09/13 0701 - 09/14 0700 In: 2825.5 [I.V.:1150.6; NG/GT:1574.8; IV Piggyback:100] Out: 6565 [Urine:6565] Intake/Output from this shift: Total I/O In: 521 [I.V.:21; NG/GT:450; IV Piggyback:50] Out: 2780 [Urine:2780]  Labs:  Recent Labs  11/18/14 0601 11/19/14 0444 11/20/14 0442  WBC 9.2 11.3* 14.7*  HGB 11.3* 12.3* 12.7*  PLT 183 196 217  CREATININE 0.57* 0.73 0.71   Estimated Creatinine Clearance: 204 mL/min (by C-G formula based on Cr of 0.71). No results for input(s): VANCOTROUGH, VANCOPEAK, VANCORANDOM, GENTTROUGH, GENTPEAK, GENTRANDOM, TOBRATROUGH, TOBRAPEAK, TOBRARND, AMIKACINPEAK, AMIKACINTROU, AMIKACIN in the last 72 hours.   Microbiology: Recent Results (from the past 720 hour(s))  MRSA PCR Screening     Status: None   Collection Time: 11/13/14  3:40 AM  Result Value Ref Range Status   MRSA by PCR NEGATIVE NEGATIVE Final    Comment:        The GeneXpert MRSA Assay (FDA approved for NASAL specimens only), is one component of a comprehensive MRSA colonization surveillance program. It is not intended to diagnose MRSA infection nor to guide or monitor treatment for MRSA infections.   Culture, bal-quantitative     Status: None   Collection Time: 11/15/14  9:53 AM  Result Value Ref Range Status   Specimen Description BRONCHIAL ALVEOLAR LAVAGE  Final   Special Requests NONE  Final   Gram Stain   Final    MANY WBC SEEN FEW GRAM POSITIVE COCCI RARE GRAM POSITIVE  RODS EXCELLENT SPECIMEN - 90-100% WBCS    Culture MODERATE GROWTH STAPHYLOCOCCUS AUREUS  Final   Report Status 11/18/2014 FINAL  Final   Organism ID, Bacteria STAPHYLOCOCCUS AUREUS  Final      Susceptibility   Staphylococcus aureus - MIC*    CIPROFLOXACIN Value in next row Sensitive      SENSITIVE<=0.5    ERYTHROMYCIN Value in next row Sensitive      SENSITIVE<=0.25    GENTAMICIN Value in next row Sensitive      SENSITIVE<=0.5    OXACILLIN Value in next row Sensitive      SENSITIVE<=0.25    TETRACYCLINE Value in next row Sensitive      SENSITIVE<=1    CLINDAMYCIN Value in next row Sensitive      SENSITIVE<=0.25    CEFOXITIN SCREEN Value in next row Sensitive      SENSITIVE<=0.25    Inducible Clindamycin Value in next row Sensitive      SENSITIVE<=0.25    LINEZOLID Value in next row Sensitive      SENSITIVE2    TRIMETH/SULFA Value in next row Sensitive      SENSITIVE<=10    * MODERATE GROWTH STAPHYLOCOCCUS AUREUS    Medical History: Past Medical History  Diagnosis Date  . Thyroid disease     Medications:  Prescriptions prior to admission  Medication Sig Dispense Refill Last Dose  . ALPRAZolam (XANAX) 1 MG tablet Take 1 mg by mouth 2 (two) times daily as needed for anxiety.   PRN at PRN  . citalopram (  CELEXA) 40 MG tablet Take 40 mg by mouth daily.   unknown at unknown  . esomeprazole (NEXIUM) 40 MG capsule Take 40 mg by mouth daily.    unknown at unknown  . ketorolac (TORADOL) 10 MG tablet Take 1 tablet (10 mg total) by mouth every 8 (eight) hours as needed. (Patient taking differently: Take 10 mg by mouth every 8 (eight) hours as needed for moderate pain. ) 20 tablet 0 PRN at PRN  . lamoTRIgine (LAMICTAL) 150 MG tablet Take 150 mg by mouth 2 (two) times daily.   unknown at unknown  . levothyroxine (SYNTHROID, LEVOTHROID) 75 MCG tablet Take 75 mcg by mouth daily before breakfast.   unknown at unknown  . naltrexone (DEPADE) 50 MG tablet Take 50 mg by mouth 2 (two) times  daily.    unknown at unknown  . ondansetron (ZOFRAN) 4 MG tablet Take 4 mg by mouth every 8 (eight) hours as needed for nausea or vomiting.   PRN at PRN  . oxyCODONE-acetaminophen (PERCOCET) 10-325 MG per tablet Take 1 tablet by mouth every 6 (six) hours as needed for pain. 20 tablet 0 PRN at PRN  . promethazine (PHENERGAN) 25 MG suppository Place 25 mg rectally every 6 (six) hours as needed for nausea or vomiting.   PRN at PRN  . rizatriptan (MAXALT) 10 MG tablet Take 10 mg by mouth as needed for migraine. May repeat in 2 hours if needed   PRN at PRN   Assessment: CrCl = 120 ml/min TBW = 140.2 kg  Goal of Therapy:  resolution of infection  Plan:  Expected duration 7 days with resolution of temperature and/or normalization of WBC  Zosyn 4.5 gm IV Q8H EI ordered to start 9/14.   Barnes Florek,Kaleb D 11/20/2014,5:16 PM

## 2014-11-20 NOTE — Progress Notes (Signed)
PULMONARY / CRITICAL CARE MEDICINE   Name: [name] MRN: [id number] DOB: 1990/12/31     Remains a sedation problem. Back on propofol this AM Copious bloody frothy secretions + fever and increased WBC RASS -3, not F/C  Filed Vitals:   11/20/14 0900 11/20/14 1000 11/20/14 1100 11/20/14 1300  BP: 118/68 103/53 123/68   Pulse: 98 94 98   Temp:    99.9 F (37.7 C)  TempSrc:    Oral  Resp: Height:      Weight:      SpO2: 96% 95% 90%    Sedated, intubated HEENT WNL JVP cannot be assessed Slightly coarse BS, no wheezes Mildly tachy, reg, no M noted Obese, soft, NT, +BS Symmetric ankle and pretibial edema MAEs, no focal deficits  BMP Latest Ref Rng 11/20/2014 11/19/2014 11/18/2014  Glucose 65 - 99 mg/dL 308(M) 578(I) 696(E)  BUN 6 - 20 mg/dL Creatinine 0.61 - 1.24 mg/dL 9.52 8.41 3.24(M)  Sodium 135 - 145 mmol/L 144 148(H) 146(H)  Potassium 3.5 - 5.1 mmol/L 3.8 4.0 3.4(L)  Chloride 101 - 111 mmol/L 102 110 113(H)  CO2 22 - 32 mmol/L 33(H) 33(H) 28  Calcium 8.9 - 10.3 mg/dL 8.2(L) 8.4(L) 8.2(L)    CBC Latest Ref Rng 11/20/2014 11/19/2014 11/18/2014  WBC 3.8 - 10.6 K/uL 14.7(H) 11.3(H) 9.2  Hemoglobin 13.0 - 18.0 g/dL 12.7(L) 12.3(L) 11.3(L)  Hematocrit 40.0 - 52.0 % 38.7(L) 37.2(L) 34.0(L)  Platelets 150 - 440 K/uL 217 196 183    CXR: diffuse interstitial and alveolar infiltrates  MAJOR EVENTS/TEST RESULTS: 9/06 UDS: + BZDs, + THC 9/07 CT head: NAD 9/14 ID consultation for fever, leukocytosis 9/14 Echocardiogram:   INDWELLING DEVICES: R radial A-line 9/11 >> 9/12 R IJ CVL 9/09 >> 9/14 ETT 9/06 >> 9/09, 9/09 >>  PICC ordered 9/14 >>   MICRO DATA: MRSA PCR 9/07 >> NEG BAL 9/09 >> MSSA Resp 9/14 >>   ANTIMICROBIALS:  Clinda 9/09 >> 9/10 Vanc 9/10 >> 9/12 Pip-tazo 9/10 >> 9/12 Ceftriaxone 9/12 >> 9/14 Vanc 9/14 >>  Pip-tazo 9/14 >>    ASSESSMENT / PLAN:  PULMONARY A: Acute respiratory failure, hypoxic ALI/ARDS, r/o pulmonary  edema Mucus plugging, resolved Poor cognition prohibits extubation P:   Cont full vent support - settings reviewed and/or adjusted Cont vent bundle Daily SBT if/when meets criteria Check echocardiogram 9/14  CARDIOVASCULAR A:  Transient hypotension, resolved P:  MAP goal > 65 mmHg  RENAL A:   Hypervolemia Hypernatremia Hypokalemia P:   Monitor BMET intermittently Monitor I/Os Correct electrolytes as indicated Lasix X 2 9/14 Cont D5W   GASTROINTESTINAL A:   Mild abd distention Constipation P:   SUP: enteral PPI Cont TFs Bowel regimen adjusted 9/14  HEMATOLOGIC A:   Mild ICU acquired anemia P:  DVT px: SQ heparin Monitor CBC intermittently Transfuse per usual ICU guidelines  INFECTIOUS A:   MSSA PNA New fever, leukocytosis 9/14 P:   Monitor temp, WBC count Micro and abx as above All abx per ID Service  ENDOCRINE A:  No issues P:   CBG q 8 hrs SSI for glu > 180  NEUROLOGIC A:   Polysubstance abuse Bipolar D/O Severe agitated delirium Very high tolerance to opiates and BZDs P:   RASS goal: -2 Resume propofol 9/14 Cont scheduled clonazepam - reduced dose 9/13 Cont scheduled quetiapine - initiated 9/12 Cont Fentanyl patch - reduced from 200 mcg/hr to 100 mcg/hr 9/13  FAMILY:  none @ bedside 9/13 Discussed with Dr Sherryll Burger and Dr Sampson Goon  CCM time: 45 mins     [name], MD PCCM service Mobile [phone] Pager [phone]  11/20/2014, 4:04 PM

## 2014-11-21 ENCOUNTER — Inpatient Hospital Stay: Payer: Federal, State, Local not specified - PPO

## 2014-11-21 LAB — CBC
HCT: 40.4 % (ref 40.0–52.0)
Hemoglobin: 13.4 g/dL (ref 13.0–18.0)
MCH: 29.7 pg (ref 26.0–34.0)
MCHC: 33.3 g/dL (ref 32.0–36.0)
MCV: 89.2 fL (ref 80.0–100.0)
Platelets: 228 K/uL (ref 150–440)
RBC: 4.52 MIL/uL (ref 4.40–5.90)
RDW: 13.7 % (ref 11.5–14.5)
WBC: 15.4 K/uL — ABNORMAL HIGH (ref 3.8–10.6)

## 2014-11-21 LAB — COMPREHENSIVE METABOLIC PANEL
ALK PHOS: 78 U/L (ref 38–126)
ALT: 50 U/L (ref 17–63)
AST: 38 U/L (ref 15–41)
Albumin: 2.9 g/dL — ABNORMAL LOW (ref 3.5–5.0)
Anion gap: 12 (ref 5–15)
BILIRUBIN TOTAL: 1.1 mg/dL (ref 0.3–1.2)
BUN: 17 mg/dL (ref 6–20)
CALCIUM: 8.6 mg/dL — AB (ref 8.9–10.3)
CO2: 31 mmol/L (ref 22–32)
CREATININE: 0.55 mg/dL — AB (ref 0.61–1.24)
Chloride: 98 mmol/L — ABNORMAL LOW (ref 101–111)
Glucose, Bld: 132 mg/dL — ABNORMAL HIGH (ref 65–99)
Potassium: 4.2 mmol/L (ref 3.5–5.1)
Sodium: 141 mmol/L (ref 135–145)
Total Protein: 7 g/dL (ref 6.5–8.1)

## 2014-11-21 LAB — GLUCOSE, CAPILLARY
Glucose-Capillary: 105 mg/dL — ABNORMAL HIGH (ref 65–99)
Glucose-Capillary: 115 mg/dL — ABNORMAL HIGH (ref 65–99)
Glucose-Capillary: 145 mg/dL — ABNORMAL HIGH (ref 65–99)

## 2014-11-21 LAB — VANCOMYCIN, TROUGH: Vancomycin Tr: 9 ug/mL — ABNORMAL LOW (ref 10–20)

## 2014-11-21 MED ORDER — CEFAZOLIN SODIUM 1-5 GM-% IV SOLN
1.0000 g | Freq: Three times a day (TID) | INTRAVENOUS | Status: DC
  Administered 2014-11-21 – 2014-11-22 (×3): 1 g via INTRAVENOUS
  Filled 2014-11-21 (×7): qty 50

## 2014-11-21 MED ORDER — HALOPERIDOL LACTATE 5 MG/ML IJ SOLN
INTRAMUSCULAR | Status: AC
  Administered 2014-11-21: 4 mg via INTRAVENOUS
  Filled 2014-11-21: qty 1

## 2014-11-21 MED ORDER — HALOPERIDOL LACTATE 5 MG/ML IJ SOLN
1.0000 mg | INTRAMUSCULAR | Status: DC | PRN
  Administered 2014-11-21 – 2014-11-23 (×3): 4 mg via INTRAVENOUS
  Filled 2014-11-21 (×2): qty 1

## 2014-11-21 MED ORDER — FENTANYL CITRATE (PF) 100 MCG/2ML IJ SOLN: 12.5000 ug | INTRAMUSCULAR | Status: DC | PRN

## 2014-11-21 MED ORDER — LEVOTHYROXINE SODIUM 100 MCG IV SOLR: 37.5000 ug | Freq: Every day | INTRAVENOUS | Status: DC

## 2014-11-21 MED ORDER — DEXMEDETOMIDINE HCL IN NACL 400 MCG/100ML IV SOLN
0.0000 ug/kg/h | INTRAVENOUS | Status: DC
  Administered 2014-11-21 (×2): 1.2 ug/kg/h via INTRAVENOUS
  Administered 2014-11-21 (×2): 1 ug/kg/h via INTRAVENOUS
  Administered 2014-11-21: 0.4 ug/kg/h via INTRAVENOUS
  Administered 2014-11-21: 1 ug/kg/h via INTRAVENOUS
  Administered 2014-11-21 – 2014-11-22 (×2): 1.2 ug/kg/h via INTRAVENOUS
  Administered 2014-11-22 – 2014-11-23 (×6): 1 ug/kg/h via INTRAVENOUS
  Administered 2014-11-23 (×2): 1.077 ug/kg/h via INTRAVENOUS
  Administered 2014-11-23 (×3): 1 ug/kg/h via INTRAVENOUS
  Administered 2014-11-24: 1.077 ug/kg/h via INTRAVENOUS
  Administered 2014-11-24: 1 ug/kg/h via INTRAVENOUS
  Administered 2014-11-24: 1.077 ug/kg/h via INTRAVENOUS
  Filled 2014-11-21 (×20): qty 100
  Filled 2014-11-21: qty 50
  Filled 2014-11-21 (×5): qty 100

## 2014-11-21 MED ORDER — DEXMEDETOMIDINE BOLUS VIA INFUSION
1.0000 ug/kg | Freq: Once | INTRAVENOUS | Status: AC
  Administered 2014-11-21: 98.2 ug via INTRAVENOUS
  Filled 2014-11-21: qty 99

## 2014-11-21 MED ORDER — QUETIAPINE FUMARATE 100 MG PO TABS: 100.0000 mg | ORAL_TABLET | Freq: Two times a day (BID) | ORAL | Status: DC

## 2014-11-21 NOTE — Progress Notes (Signed)
Propofol rate decreased from to by dr. Sung Amabile

## 2014-11-21 NOTE — Progress Notes (Signed)
Extubated,very restless,placed on 55%venti mask,nursing started precedex,becoming calmer,placed on nrm for sats 95%

## 2014-11-21 NOTE — Progress Notes (Signed)
PULMONARY / CRITICAL CARE MEDICINE   Name: [name] MRN: [id number] DOB: 03-24-1990     RASS -1 on propofol gtt this AM. Intermittently F/C Resp secretions improved Extubated after passing SBT this AM. Very agitated as propofol weaned to off. Improved after Haldol and dexmedetomidine  Filed Vitals:   11/21/14 0900 11/21/14 1000 11/21/14 1052 11/21/14 1100  BP: 128/77 120/91  99/68  Pulse: 98 118 92 95  Temp:      TempSrc:      Resp: 21 33 23 24  Height:      Weight:      SpO2: 96% 87% 96% 97%   RASS -1 to +2, + F/C HEENT WNL JVP cannot be assessed Diffuse rhonchi Tachy, reg, no M noted Obese, soft, NT, +BS Trace symmetric ankle and pretibial edema MAEs, no focal deficits  BMP Latest Ref Rng 11/21/2014 11/20/2014 11/19/2014  Glucose 65 - 99 mg/dL 657(Q) 469(G) 295(M)  BUN 6 - 20 mg/dL 17 17 13   Creatinine 0.61 - 1.24 mg/dL 8.41(L) 2.44 0.10  Sodium 135 - 145 mmol/L 141 144 148(H)  Potassium 3.5 - 5.1 mmol/L 4.2 3.8 4.0  Chloride 101 - 111 mmol/L 98(L) 102 110  CO2 22 - 32 mmol/L 31 33(H) 33(H)  Calcium 8.9 - 10.3 mg/dL 2.7(O) 5.3(G) 6.4(Q)    CBC Latest Ref Rng 11/21/2014 11/20/2014 11/19/2014  WBC 3.8 - 10.6 K/uL 15.4(H) 14.7(H) 11.3(H)  Hemoglobin 13.0 - 18.0 g/dL 03.4 12.7(L) 12.3(L)  Hematocrit 40.0 - 52.0 % 40.4 38.7(L) 37.2(L)  Platelets 150 - 440 K/uL 228 217 196    CXR: NSC  MAJOR EVENTS/TEST RESULTS: 9/06 UDS: + BZDs, + THC 9/07 CT head: NAD 9/14 ID consultation for fever, leukocytosis 9/14 Echocardiogram: LVEF 60%, grade 1 diastolic dysfunction, LA mildly dilated  INDWELLING DEVICES: R radial A-line 9/11 >> 9/12 R IJ CVL 9/09 >> 9/14 ETT 9/06 >> 9/09, 9/09 >> 9/15 LUE PICC 9/14 >>   MICRO DATA: MRSA PCR 9/07 >> NEG BAL 9/09 >> MSSA Resp 9/14 >> moderate staph aureus >>  Cath tip cx 9/14 >>  Blood 9/14 >>   ANTIMICROBIALS:  Clinda 9/09 >> 9/10 Vanc 9/10 >> 9/12 Pip-tazo 9/10 >> 9/12 Ceftriaxone 9/12 >> 9/14 Vanc 9/14 >>  Pip-tazo 9/14  >>    ASSESSMENT / PLAN:  PULMONARY A: Acute respiratory failure, hypoxic Pulm infiltrates - improving Mucus plugging, resolved P:   Monitor in ICU post extubation Supplemental O2 to maintain SpO2 > 88%  CARDIOVASCULAR A:  Transient hypotension, resolved P:  MAP goal > 65 mmHg  RENAL A:   Hypervolemia, improved Hypernatremia, resolved Hypokalemia, resolved P:   Monitor BMET intermittently Monitor I/Os Correct electrolytes as indicated  GASTROINTESTINAL A:   Mild abd distention, resolved Constipation Post extubation dysphagia P:   SUP: N/I post extubation NPO post extubation Cont bowel regimen  HEMATOLOGIC A:   Mild ICU acquired anemia P:  DVT px: SQ heparin Monitor CBC intermittently Transfuse per usual guidelines  INFECTIOUS A:   MSSA PNA New fever, leukocytosis 9/14 P:   Monitor temp, WBC count Micro and abx as above All abx per ID Service  ENDOCRINE A:  No issues P:   CBG q 8 hrs SSI for glu > 180  NEUROLOGIC A:   Polysubstance abuse Bipolar D/O Severe agitated delirium P:   RASS goal: 0 DC scheduled clonazepam  DC scheduled quetiapine  Cont Fentanyl patch  PRN haloperidol dexmedetomidine gtt  FAMILY: none @ bedside 9/13  CCM time:  45 mins    [name], MD PCCM service Mobile [phone] Pager [phone]  11/21/2014, 12:56 PM

## 2014-11-21 NOTE — Progress Notes (Signed)
Patient ID: [name], male   DOB: [birth year], 24 y.o.   MRN: [id number] [facility] PROGRESS NOTE  PCP: Derwood Kaplan, MD  HPI/Subjective: Patient extubated today but unable to respond to me. He followed some commands by the aides to lift up his legs to clean up a bowel movement.  Objective: Filed Vitals:   11/21/14 1500  BP: 142/81  Pulse: 99  Temp:   Resp: 24    Filed Weights   11/19/14 0444 11/20/14 0422 11/21/14 0636  Weight: 143.2 kg (315 lb 11.2 oz) 140.2 kg (309 lb 1.4 oz) 132.6 kg (292 lb 5.3 oz)    ROS: Review of Systems  Unable to perform ROS  just extubated today, still with altered mental status Exam: Physical Exam  Constitutional: He appears lethargic.  HENT:  Nose: No mucosal edema.  Eyes: Conjunctivae, EOM and lids are normal. Pupils are equal, round, and reactive to light.  Neck: No JVD present. Carotid bruit is not present. No edema present. No thyroid mass and no thyromegaly present.  Cardiovascular: S1 normal and S2 normal.  Exam reveals no gallop.   No murmur heard. Pulses:      Dorsalis pedis pulses are 2+ on the right side, and 2+ on the left side.  Respiratory: No respiratory distress. He has no wheezes. He has rhonchi in the right lower field and the left lower field. He has no rales.  GI: Soft. Bowel sounds are normal. There is no tenderness.  Musculoskeletal:       Right ankle: He exhibits no swelling.       Left ankle: He exhibits no swelling.  Lymphadenopathy:    He has no cervical adenopathy.  Neurological: He appears lethargic.  I saw him move his legs. Unable to get much out of him.  Skin: Skin is warm. Nails show no clubbing.  Some scabbing around the right neck area and chest  Psychiatric:  Unable to assess, didn't answer my questions.    Data Reviewed: Basic Metabolic Panel:  Recent Labs Lab 11/15/14 1059 11/16/14 0512 11/17/14 0505 11/17/14 1744 11/18/14 0601 11/19/14 0444 11/20/14 0442  11/21/14 0533  NA 144 147* 145  --  146* 148* 144 141  K 4.1 3.8 3.0* 3.2* 3.4* 4.0 3.8 4.2  CL 114* 117* 116*  --  113* 110 102 98*  CO2 25 26 26   --  28 33* 33* 31  GLUCOSE 107* 128* 98  --  100* 107* 118* 132*  BUN 7 10 9   --  11 13 17 17   CREATININE 0.67 0.90 0.72  --  0.57* 0.73 0.71 0.55*  CALCIUM 8.1* 7.7* 7.9*  --  8.2* 8.4* 8.2* 8.6*  MG  --  1.9  --  2.0 1.9 2.2  --   --   PHOS 4.1 4.1  --   --   --  3.9  --   --    Liver Function Tests:  Recent Labs Lab 11/15/14 1059 11/17/14 0505 11/18/14 0601 11/19/14 0444 11/21/14 0533  AST 26 39 33 50* 38  ALT 25 21 24  34 50  ALKPHOS 48 45 51 72 78  BILITOT 1.0 0.9 1.1 0.7 1.1  PROT 5.9* 5.0* 5.0* 5.8* 7.0  ALBUMIN 3.2* 2.3* 2.3* 2.5* 2.9*   CBC:  Recent Labs Lab 11/17/14 0505 11/18/14 0601 11/19/14 0444 11/20/14 0442 11/21/14 0533  WBC 11.5* 9.2 11.3* 14.7* 15.4*  HGB 11.6* 11.3* 12.3* 12.7* 13.4  HCT 35.2* 34.0* 37.2* 38.7*  40.4  MCV 90.3 89.6 90.5 89.6 89.2  PLT 135* 183 196 217 228   CBG:  Recent Labs Lab 11/20/14 0007 11/20/14 1259 11/20/14 1647 11/21/14 0047 11/21/14 0716  GLUCAP 99 115* 112* 145* 105*    Recent Results (from the past 240 hour(s))  MRSA PCR Screening     Status: None   Collection Time: 11/13/14  3:40 AM  Result Value Ref Range Status   MRSA by PCR NEGATIVE NEGATIVE Final    Comment:        The GeneXpert MRSA Assay (FDA approved for NASAL specimens only), is one component of a comprehensive MRSA colonization surveillance program. It is not intended to diagnose MRSA infection nor to guide or monitor treatment for MRSA infections.   Culture, bal-quantitative     Status: None   Collection Time: 11/15/14  9:53 AM  Result Value Ref Range Status   Specimen Description BRONCHIAL ALVEOLAR LAVAGE  Final   Special Requests NONE  Final   Gram Stain   Final    MANY WBC SEEN FEW GRAM POSITIVE COCCI RARE GRAM POSITIVE RODS EXCELLENT SPECIMEN - 90-100% WBCS    Culture MODERATE  GROWTH STAPHYLOCOCCUS AUREUS  Final   Report Status 11/18/2014 FINAL  Final   Organism ID, Bacteria STAPHYLOCOCCUS AUREUS  Final      Susceptibility   Staphylococcus aureus - MIC*    CIPROFLOXACIN Value in next row Sensitive      SENSITIVE<=0.5    ERYTHROMYCIN Value in next row Sensitive      SENSITIVE<=0.25    GENTAMICIN Value in next row Sensitive      SENSITIVE<=0.5    OXACILLIN Value in next row Sensitive      SENSITIVE<=0.25    TETRACYCLINE Value in next row Sensitive      SENSITIVE<=1    CLINDAMYCIN Value in next row Sensitive      SENSITIVE<=0.25    CEFOXITIN SCREEN Value in next row Sensitive      SENSITIVE<=0.25    Inducible Clindamycin Value in next row Sensitive      SENSITIVE<=0.25    LINEZOLID Value in next row Sensitive      SENSITIVE2    TRIMETH/SULFA Value in next row Sensitive      SENSITIVE<=10    * MODERATE GROWTH STAPHYLOCOCCUS AUREUS  Culture, blood (routine x 2)     Status: None (Preliminary result)   Collection Time: 11/20/14  9:18 AM  Result Value Ref Range Status   Specimen Description BLOOD RIGHT ARM  Final   Special Requests BOTTLES DRAWN AEROBIC AND ANAEROBIC  7CC  Final   Culture NO GROWTH 1 DAY  Final   Report Status PENDING  Incomplete  Culture, blood (routine x 2)     Status: None (Preliminary result)   Collection Time: 11/20/14  9:20 AM  Result Value Ref Range Status   Specimen Description BLOOD LEFT ARM  Final   Special Requests   Final    BOTTLES DRAWN AEROBIC AND ANAEROBIC  6 CC AERO 5 CC ANAERO   Culture  Setup Time   Final    GRAM POSITIVE COCCI IN CLUSTERS CRITICAL RESULT CALLED TO, READ BACK BY AND VERIFIED WITH: JAMIE SMITH GREGORY AT 1325 11/21/14 DV ANAEROBIC BOTTLE ONLY    Culture GRAM POSITIVE COCCI IDENTIFICATION TO FOLLOW  Final   Report Status PENDING  Incomplete  Culture, respiratory (NON-Expectorated)     Status: None (Preliminary result)   Collection Time: 11/20/14 12:00 PM  Result Value Ref Range  Status   Specimen  Description TRACHEAL ASPIRATE  Final   Special Requests NONE  Final   Gram Stain   Final    FEW WBC SEEN RARE GRAM POSITIVE COCCI IN CLUSTERS GOOD SPECIMEN - 80-90% WBCS    Culture   Final    MODERATE GROWTH STAPHYLOCOCCUS AUREUS SUSCEPTIBILITIES TO FOLLOW    Report Status PENDING  Incomplete  Cath Tip Culture     Status: None (Preliminary result)   Collection Time: 11/20/14  4:32 PM  Result Value Ref Range Status   Specimen Description CATH TIP  Final   Special Requests NONE  Final   Culture NO GROWTH < 24 HOURS  Final   Report Status PENDING  Incomplete     Studies: Dg Chest Port 1 View  11/21/2014   CLINICAL DATA:  Respiratory failure  EXAM: PORTABLE CHEST - 1 VIEW  COMPARISON:  Portable chest x-ray of 11/20/2014  FINDINGS: The tip of the endotracheal tube is unchanged in position well above the carina. and NG tube remains. The right central venous line has been removed and no pneumothorax is seen. There is mild pulmonary vascular congestion present, with perhaps slightly improved aeration. Cardiomegaly is stable.  IMPRESSION: 1. Right central venous line removed.  No pneumothorax. 2. Mild pulmonary vascular congestion remains. Minimally improved aeration.   Electronically Signed   By: Dwyane Dee M.D.   On: 11/21/2014 08:02   Dg Chest Port 1 View  11/20/2014   CLINICAL DATA:  PICC line placement  EXAM: PORTABLE CHEST - 1 VIEW  COMPARISON:  11/20/2014  FINDINGS: Left PICC line is in place with the tip at the cavoatrial junction. Endotracheal tube, right central line and NG tube are unchanged. Improving aeration in the lungs with decreasing bilateral airspace disease. Low lung volumes with bibasilar atelectasis and mild vascular congestion. No effusions.  IMPRESSION: Left PICC line tip at the cavoatrial junction.  Improving bilateral airspace disease. Continued low volumes, vascular congestion and bibasilar atelectasis.   Electronically Signed   By: Charlett Nose M.D.   On: 11/20/2014  15:30   Dg Chest Port 1 View  11/20/2014   CLINICAL DATA:  Hypoxia  EXAM: PORTABLE CHEST - 1 VIEW  COMPARISON:  November 19, 2014  FINDINGS: Endotracheal tube tip is at the carina directed toward the right main bronchus. Central catheter tip is at the cavoatrial junction. Nasogastric tube tip and side port are in the stomach. No pneumothorax. There is moderate alveolar edema bilaterally. Heart is upper normal in size with pulmonary vascularity within normal limits. No adenopathy.  IMPRESSION: Tube and catheter positions as described without pneumothorax. Note that the endotracheal tube tip is at the carina directed toward the right main bronchus. Advise withdrawing endotracheal tube approximately 4 cm. There is slight increase in alveolar edema bilaterally. No change in cardiac silhouette.  Critical Value/emergent results were called by telephone at the time of interpretation on 11/20/2014 at 8:01 am to [facility], RN, who verbally acknowledged these results.   Electronically Signed   By: Bretta Bang III M.D.   On: 11/20/2014 08:01    Scheduled Meds: . antiseptic oral rinse  7 mL Mouth Rinse QID  . chlorhexidine gluconate  15 mL Mouth Rinse BID  . heparin  5,000 Units Subcutaneous 3 times per day  . Influenza vac split quadrivalent PF  0.5 mL Intramuscular Tomorrow-1000  . levothyroxine  37.5 mcg Intravenous Daily  . piperacillin-tazobactam (ZOSYN)  IV  4.5 g Intravenous 3 times per day  .  vancomycin  1,500 mg Intravenous Q8H   Continuous Infusions: . dexmedetomidine 1 mcg/kg/hr (11/21/14 1227)    Assessment/Plan: 1. Acute respiratory failure- extubated today but on 100% nonrebreather. High risk for intubation. Watch closely in the ICU. 2. Aspiration pneumonia- staph aureus growing out of the BAL.switch antibiotics to Ancef and DC vancomycin and Zosyn. 3. Acute encephalopathy- continue Precedex drip and titrate off. Patient was on a high dose of sedative medications prior. 4.  Morbid obesity 5. Hypothyroidism continue levothyroxine. 6. Bipolar disorder- psychiatry to assess when able to answer questions. Involuntary commitment paperwork and due for today.   Code Status:     Code Status Orders        Start     Ordered   11/13/14 0345  Full code   Continuous     11/13/14 0344     Disposition Plan: to be determined  Consultants:  Critical care specialist  Psychiatry  Time spent: 20 minutes  Alford Highland  [facility]

## 2014-11-21 NOTE — Progress Notes (Signed)
ID E note Low grade fevers persist. BCX ngtd, Sputum cx with Staph aureus again - sensis pending  Rec Cont zosyn and vanco If Staph is MSSA would dc vanco and continue just zosyn for MSSA PNA and aspiration coverage

## 2014-11-21 NOTE — Progress Notes (Signed)
Nutrition Follow-up      INTERVENTION:  Coordination of care: Discussed in ICU rounds this am, extubated, tube feeding stopped, await diet progression   NUTRITION DIAGNOSIS:   Inadequate oral intake related to acute illness as evidenced by NPO status.    GOAL:   Patient will meet greater than or equal to 90% of their needs    MONITOR:    (Energy Intake, Anthropometrics, Electrolyte/Renal Profile, Digestive System)  REASON FOR ASSESSMENT:   Consult Enteral/tube feeding initiation and management  ASSESSMENT:      Pt extubated this am on non-rebreather mask. Possible re-intubation   Current Nutrition: NPO   Gastrointestinal Profile: Last BM: 9/15   Medications: reviewed  Electrolyte/Renal Profile and Glucose Profile:   Recent Labs Lab 11/15/14 1059  11/16/14 0512  11/17/14 1744 11/18/14 0601 11/19/14 0444 11/20/14 0442 11/21/14 0533  NA 144  --  147*  < >  --  146* 148* 144 141  K 4.1  --  3.8  < > 3.2* 3.4* 4.0 3.8 4.2  CL 114*  --  117*  < >  --  113* 110 102 98*  CO2 25  --  26  < >  --  28 33* 33* 31  BUN 7  --  10  < >  --  CREATININE 0.67  --  0.90  < >  --  0.57* 0.73 0.71 0.55*  CALCIUM 8.1*  --  7.7*  < >  --  8.2* 8.4* 8.2* 8.6*  MG  --   < > 1.9  --  2.0 1.9 2.2  --   --   PHOS 4.1  --  4.1  --   --   --  3.9  --   --   GLUCOSE 107*  --  128*  < >  --  100* 107* 118* 132*  < > = values in this interval not displayed. Protein Profile:  Recent Labs Lab 11/18/14 0601 11/19/14 0444 11/21/14 0533  ALBUMIN 2.3* 2.5* 2.9*        Weight Trend since Admission: Filed Weights   11/19/14 0444 11/20/14 0422 11/21/14 0636  Weight: 315 lb 11.2 oz (143.2 kg) 309 lb 1.4 oz (140.2 kg) 292 lb 5.3 oz (132.6 kg)      Diet Order:  Diet NPO time specified  Skin:   reviewed   Height:   Ht Readings from Last 1 Encounters:  11/16/14  (1.803 m)    Weight:   Wt Readings from Last 1 Encounters:  11/21/14 292 lb 5.3  oz (132.6 kg)       BMI:  Body mass index is 40.79 kg/(m^2).  Estimated Nutritional Needs:   Kcal:  BEE 1792 kcals (IF 1.0-1.3, AF 1.2) 1610-9604 kcals/d.Using IBW of 78kg  Protein:  (1.2-1.5 g/kg) Using IBW of 203 152 1150 gm/d  Fluid:  (25-25ml/kg) 1950-2377ml/d  EDUCATION NEEDS:      HIGH Care Level  Coraline Talwar B. Freida Busman, RD, LDN [phone] (pager)

## 2014-11-21 NOTE — Progress Notes (Signed)
1610 Per dr simonds, pt extubated by RT. Pt placed on 6l [place]. Immediately post extubation, o2 81%, hr 114, rr 18. Pt coughing up white frothy sputum. Pt suctioned with yanker. Pt tolerated well. rn and rt at bedside.  1000 pt placed on venti mask, o2 87%, hr 118, rr 33. Pt agitated and restless.   1015 pt still agitated and restless, 02 88%, hr 115, rr35,   1024 per dr simonds order, percedex gtt started.   1055 pt agitation decreased, placed on nrb, o2 97%, hr 95, rr24. Pt resting comfortably and  will follow simple commands.

## 2014-11-22 LAB — CATH TIP CULTURE: Culture: NO GROWTH

## 2014-11-22 LAB — BASIC METABOLIC PANEL
ANION GAP: 8 (ref 5–15)
BUN: 20 mg/dL (ref 6–20)
CALCIUM: 8.6 mg/dL — AB (ref 8.9–10.3)
CO2: 27 mmol/L (ref 22–32)
Chloride: 107 mmol/L (ref 101–111)
Creatinine, Ser: 0.57 mg/dL — ABNORMAL LOW (ref 0.61–1.24)
Glucose, Bld: 107 mg/dL — ABNORMAL HIGH (ref 65–99)
Potassium: 3.7 mmol/L (ref 3.5–5.1)
SODIUM: 142 mmol/L (ref 135–145)

## 2014-11-22 LAB — CBC
HCT: 39.1 % — ABNORMAL LOW (ref 40.0–52.0)
Hemoglobin: 13.3 g/dL (ref 13.0–18.0)
MCH: 29.8 pg (ref 26.0–34.0)
MCHC: 33.9 g/dL (ref 32.0–36.0)
MCV: 88 fL (ref 80.0–100.0)
PLATELETS: 234 10*3/uL (ref 150–440)
RBC: 4.44 MIL/uL (ref 4.40–5.90)
RDW: 13.6 % (ref 11.5–14.5)
WBC: 14.4 10*3/uL — AB (ref 3.8–10.6)

## 2014-11-22 LAB — GLUCOSE, CAPILLARY
Glucose-Capillary: 101 mg/dL — ABNORMAL HIGH (ref 65–99)
Glucose-Capillary: 110 mg/dL — ABNORMAL HIGH (ref 65–99)

## 2014-11-22 LAB — TSH: TSH: 2.925 u[IU]/mL (ref 0.350–4.500)

## 2014-11-22 MED ORDER — ENOXAPARIN SODIUM 40 MG/0.4ML ~~LOC~~ SOLN
40.0000 mg | SUBCUTANEOUS | Status: DC
  Administered 2014-11-22 – 2014-11-25 (×4): 40 mg via SUBCUTANEOUS
  Filled 2014-11-22 (×4): qty 0.4

## 2014-11-22 MED ORDER — QUETIAPINE FUMARATE 100 MG PO TABS
100.0000 mg | ORAL_TABLET | Freq: Every morning | ORAL | Status: DC
  Administered 2014-11-22 – 2014-11-25 (×4): 100 mg via ORAL
  Filled 2014-11-22 (×4): qty 1

## 2014-11-22 MED ORDER — CEFAZOLIN SODIUM 1-5 GM-% IV SOLN
1.0000 g | Freq: Four times a day (QID) | INTRAVENOUS | Status: DC
  Administered 2014-11-22 – 2014-11-23 (×4): 1 g via INTRAVENOUS
  Filled 2014-11-22 (×8): qty 50

## 2014-11-22 MED ORDER — LEVOTHYROXINE SODIUM 75 MCG PO TABS
75.0000 ug | ORAL_TABLET | Freq: Every day | ORAL | Status: DC
  Administered 2014-11-23 – 2014-11-25 (×3): 75 ug via ORAL
  Filled 2014-11-22 (×3): qty 1

## 2014-11-22 MED ORDER — NICOTINE 10 MG IN INHA
1.0000 | RESPIRATORY_TRACT | Status: DC | PRN
  Administered 2014-11-23: 1 via RESPIRATORY_TRACT
  Filled 2014-11-22: qty 36

## 2014-11-22 MED ORDER — QUETIAPINE FUMARATE 100 MG PO TABS
200.0000 mg | ORAL_TABLET | Freq: Every day | ORAL | Status: DC
  Administered 2014-11-22 – 2014-11-24 (×3): 200 mg via ORAL
  Filled 2014-11-22 (×3): qty 2

## 2014-11-22 NOTE — Progress Notes (Signed)
[facility] INFECTIOUS DISEASE PROGRESS NOTE Date of Admission:  11/12/2014     ID: [name] is a 24 y.o. male with overdose and MSSA PNA Principal Problem:   Acute respiratory failure with hypoxia Active Problems:   Overdose   Aspiration pneumonia   Autism   Bipolar 1 disorder   ARDS (adult respiratory distress syndrome)   Severe sepsis with acute organ dysfunction   Subjective: Extubatd, eating.  ROS  Eleven systems are reviewed and negative except per hpi  Medications:  Antibiotics Given (last 72 hours)    Date/Time Action Medication Dose Rate   11/19/14 1630 Given   cefTRIAXone (ROCEPHIN) 1 g in dextrose 5 % 50 mL IVPB 1 g 100 mL/hr   11/20/14 1000 Given   cefTAZidime (FORTAZ) 2 g in dextrose 5 % 50 mL IVPB 2 g 100 mL/hr   11/20/14 1526 Given   vancomycin (VANCOCIN) 1,500 mg in sodium chloride 0.9 % 500 mL IVPB 1,500 mg 250 mL/hr   11/20/14 1740 Given   piperacillin-tazobactam (ZOSYN) IVPB 4.5 g 4.5 g 200 mL/hr   11/20/14 2148 Given   vancomycin (VANCOCIN) 1,500 mg in sodium chloride 0.9 % 500 mL IVPB 1,500 mg 250 mL/hr   11/21/14 0014 Given   piperacillin-tazobactam (ZOSYN) IVPB 4.5 g 4.5 g 200 mL/hr   11/21/14 0517 Given   vancomycin (VANCOCIN) 1,500 mg in sodium chloride 0.9 % 500 mL IVPB 1,500 mg 250 mL/hr   11/21/14 6962 Given   piperacillin-tazobactam (ZOSYN) IVPB 4.5 g 4.5 g 200 mL/hr   11/21/14 1414 Given   vancomycin (VANCOCIN) 1,500 mg in sodium chloride 0.9 % 500 mL IVPB 1,500 mg 250 mL/hr   11/21/14 1417 Given   piperacillin-tazobactam (ZOSYN) IVPB 4.5 g 4.5 g 200 mL/hr   11/21/14 1636 Given   ceFAZolin (ANCEF) IVPB 1 g/50 mL premix 1 g 100 mL/hr   11/21/14 2248 Given   ceFAZolin (ANCEF) IVPB 1 g/50 mL premix 1 g 100 mL/hr   11/22/14 0612 Given   ceFAZolin (ANCEF) IVPB 1 g/50 mL premix 1 g 100 mL/hr     .  ceFAZolin (ANCEF) IV  1 g Intravenous 4 times per day  . enoxaparin (LOVENOX) injection  40 mg Subcutaneous Q24H  . Influenza vac split  quadrivalent PF  0.5 mL Intramuscular Tomorrow-1000  . [START ON 11/23/2014] levothyroxine  75 mcg Oral QAC breakfast  . QUEtiapine  100 mg Oral q morning - 10a  . QUEtiapine  200 mg Oral QHS    Objective: Vital signs in last 24 hours: Temp:  [98.1 F (36.7 C)-99.2 F (37.3 C)] 99.2 F (37.3 C) (09/16 0430) Pulse Rate:  [77-106] 99 (09/16 1000) Resp:  [19-28] 19 (09/16 1000) BP: (112-142)/(63-86) 121/78 mmHg (09/16 1000) SpO2:  [88 %-98 %] 94 % (09/16 1000) Weight:  [119.4 kg (263 lb 3.7 oz)-142.8 kg (314 lb 13.1 oz)] 119.4 kg (263 lb 3.7 oz) (09/16 0500) Constitutional: morbidly obese, ill appearing, extubated  HENT: PERRLA, anicteric Mouth/Throat: Oropharynx ETT in place Cardiovascular: Normal rate, regular rhythm and normal heart sounds. Pulmonary/Chest: rhonhci bil Abdominal: Soft. Bowel sounds are normal. He exhibits no distension. There is no tenderness.  Lymphadenopathy: He has no cervical adenopathy.  Neurological: intubated, sedated on propofol Skin: Skin is warm and dry. Multiple acne like lesions on chest Psychiatric: sedated EXT 1+ edema bil PICC RUE  Lab Results  Recent Labs  11/21/14 0533 11/22/14 0624  WBC 15.4* 14.4*  HGB 13.4 13.3  HCT 40.4 39.1*  NA 141 142  K 4.2 3.7  CL 98* 107  CO2 31 27  BUN 17 20  CREATININE 0.55* 0.57*    Microbiology: Results for orders placed or performed during the hospital encounter of 11/12/14  MRSA PCR Screening     Status: None   Collection Time: 11/13/14  3:40 AM  Result Value Ref Range Status   MRSA by PCR NEGATIVE NEGATIVE Final    Comment:        The GeneXpert MRSA Assay (FDA approved for NASAL specimens only), is one component of a comprehensive MRSA colonization surveillance program. It is not intended to diagnose MRSA infection nor to guide or monitor treatment for MRSA infections.   Culture, bal-quantitative     Status: None   Collection Time: 11/15/14  9:53 AM  Result Value Ref Range Status    Specimen Description BRONCHIAL ALVEOLAR LAVAGE  Final   Special Requests NONE  Final   Gram Stain   Final    MANY WBC SEEN FEW GRAM POSITIVE COCCI RARE GRAM POSITIVE RODS EXCELLENT SPECIMEN - 90-100% WBCS    Culture MODERATE GROWTH STAPHYLOCOCCUS AUREUS  Final   Report Status 11/18/2014 FINAL  Final   Organism ID, Bacteria STAPHYLOCOCCUS AUREUS  Final      Susceptibility   Staphylococcus aureus - MIC*    CIPROFLOXACIN Value in next row Sensitive      SENSITIVE<=0.5    ERYTHROMYCIN Value in next row Sensitive      SENSITIVE<=0.25    GENTAMICIN Value in next row Sensitive      SENSITIVE<=0.5    OXACILLIN Value in next row Sensitive      SENSITIVE<=0.25    TETRACYCLINE Value in next row Sensitive      SENSITIVE<=1    CLINDAMYCIN Value in next row Sensitive      SENSITIVE<=0.25    CEFOXITIN SCREEN Value in next row Sensitive      SENSITIVE<=0.25    Inducible Clindamycin Value in next row Sensitive      SENSITIVE<=0.25    LINEZOLID Value in next row Sensitive      SENSITIVE2    TRIMETH/SULFA Value in next row Sensitive      SENSITIVE<=10    * MODERATE GROWTH STAPHYLOCOCCUS AUREUS  Culture, blood (routine x 2)     Status: None (Preliminary result)   Collection Time: 11/20/14  9:18 AM  Result Value Ref Range Status   Specimen Description BLOOD RIGHT ARM  Final   Special Requests BOTTLES DRAWN AEROBIC AND ANAEROBIC  7CC  Final   Culture NO GROWTH 2 DAYS  Final   Report Status PENDING  Incomplete  Culture, blood (routine x 2)     Status: None (Preliminary result)   Collection Time: 11/20/14  9:20 AM  Result Value Ref Range Status   Specimen Description BLOOD LEFT ARM  Final   Special Requests   Final    BOTTLES DRAWN AEROBIC AND ANAEROBIC  6 CC AERO 5 CC ANAERO   Culture  Setup Time   Final    GRAM POSITIVE COCCI IN CLUSTERS CRITICAL RESULT CALLED TO, READ BACK BY AND VERIFIED WITH: JAMIE SMITH GREGORY AT 1325 11/21/14 DV ANAEROBIC BOTTLE ONLY    Culture   Final     COAGULASE NEGATIVE STAPHYLOCOCCUS ANAEROBIC BOTTLE ONLY Results consistent with contamination.    Report Status PENDING  Incomplete  Culture, respiratory (NON-Expectorated)     Status: None (Preliminary result)   Collection Time: 11/20/14 12:00 PM  Result Value Ref Range  Status   Specimen Description TRACHEAL ASPIRATE  Final   Special Requests NONE  Final   Gram Stain   Final    FEW WBC SEEN RARE GRAM POSITIVE COCCI IN CLUSTERS GOOD SPECIMEN - 80-90% WBCS    Culture MODERATE GROWTH STAPHYLOCOCCUS AUREUS  Final   Report Status PENDING  Incomplete   Organism ID, Bacteria STAPHYLOCOCCUS AUREUS  Final      Susceptibility   Staphylococcus aureus - MIC*    CIPROFLOXACIN <=0.5 SENSITIVE Sensitive     GENTAMICIN <=0.5 SENSITIVE Sensitive     OXACILLIN <=0.25 SENSITIVE Sensitive     TRIMETH/SULFA <=10 SENSITIVE Sensitive     CEFOXITIN SCREEN NEGATIVE Sensitive     Inducible Clindamycin NEGATIVE Sensitive     TETRACYCLINE Value in next row Sensitive      SENSITIVE<=1    * MODERATE GROWTH STAPHYLOCOCCUS AUREUS  Cath Tip Culture     Status: None   Collection Time: 11/20/14  4:32 PM  Result Value Ref Range Status   Specimen Description CATH TIP  Final   Special Requests NONE  Final   Culture NO GROWTH 2 DAYS  Final   Report Status 11/22/2014 FINAL  Final    Studies/Results: Dg Chest Port 1 View  11/21/2014   CLINICAL DATA:  Respiratory failure  EXAM: PORTABLE CHEST - 1 VIEW  COMPARISON:  Portable chest x-ray of 11/20/2014  FINDINGS: The tip of the endotracheal tube is unchanged in position well above the carina. and NG tube remains. The right central venous line has been removed and no pneumothorax is seen. There is mild pulmonary vascular congestion present, with perhaps slightly improved aeration. Cardiomegaly is stable.  IMPRESSION: 1. Right central venous line removed.  No pneumothorax. 2. Mild pulmonary vascular congestion remains. Minimally improved aeration.   Electronically Signed    By: Dwyane Dee M.D.   On: 11/21/2014 08:02   Dg Chest Port 1 View  11/20/2014   CLINICAL DATA:  PICC line placement  EXAM: PORTABLE CHEST - 1 VIEW  COMPARISON:  11/20/2014  FINDINGS: Left PICC line is in place with the tip at the cavoatrial junction. Endotracheal tube, right central line and NG tube are unchanged. Improving aeration in the lungs with decreasing bilateral airspace disease. Low lung volumes with bibasilar atelectasis and mild vascular congestion. No effusions.  IMPRESSION: Left PICC line tip at the cavoatrial junction.  Improving bilateral airspace disease. Continued low volumes, vascular congestion and bibasilar atelectasis.   Electronically Signed   By: Charlett Nose M.D.   On: 11/20/2014 15:30    Assessment/Plan: Assessment:  [name] is a 23 y.o. male with hx substance abuse admitted with AMS after presumed overdose. He was intubated in ED. Initially afebrile and had nml wbc however subsequently developed fevers, leukocytosis. Was extubated briefly 9/8 but then reintubated. Started on clindamycin 9/9- 9/10, then vanco and zosyn 9/10-11. Cx from sputum with MSSA so changed to ceftriaxone 9/13. Likely has aspiration PNA. Prior sputum cx with MSSA. BCX 9/14 ngtd.  Recommendations Changed to ancef (can transition to oral keflex) - would give 10 day course from 9/14- stop date 9/24 If worsens change to augmentin to cover aspiration - will also cover MSSA. Thank you very much for the consult. Will follow with you.  Gualberto Wahlen   11/22/2014, 2:15 PM

## 2014-11-22 NOTE — Consult Note (Signed)
  Psychiatry: Follow-up for this 24 year old man status post a multiple drug overdose who has been in the critical care unit ventilated for several days. Patient has now been extubated. I came to see him today and reviewed the chart and spoke to the on duty nurse. Nursing states that the patient has been quite irritable. Uses a lot of profane language with staff. When they tried earlier today to discontinue his Precedex drip he became very agitated striking out at staff members and the Precedex had to be resumed. On interview today the patient initially showed some signs that he would be cooperative. He made eye contact and reached out to shake hands with me. He acknowledged that he remembered me from previous contacts. He was able to tell me that he was not in a great deal of pain. However, when I started asking questions about his hospitalization and his overdose he immediately closed his eyes and began to act as though he were asleep. He refused to answer any further questions or to engage in any further conversation.  Patient with a history of behavior problems and substance abuse. Has been intubated for several days. Gets agitated when sedation is removed. Vital signs show him to still be intermittently tachycardic blood pressure improving not febrile.  On mental status as noted he was only partially open to evaluation. Brief eye contact. Slight amount of slurred speech. Flat affect. Then he refused to engage in any other conversation.  Diagnosis: Delirium from [place] drug overdose. History of multiple substance abuse and bipolar disorder. Patient is not yet ready for transfer to the psychiatry unit as I cannot even do a real evaluation of him yet and he is still on a Precedex drip. Please contact the psychiatrist on call over the weekend if a new evaluation is desired. Otherwise I will follow-up with him on Monday.

## 2014-11-22 NOTE — Progress Notes (Signed)
Psych MD Colpack at bedside to see pt.

## 2014-11-22 NOTE — Plan of Care (Signed)
Problem: Acute Rehab PT Goals(only PT should resolve) Goal: Pt Will Go Supine/Side To Sit Pt will demonstrate ModI bed mobility supine to sitting edge-of-bed to return to PLOF and to decrease caregiver burden.      Goal: Patient Will Transfer Sit To/From Stand Pt will transfer sit to/from-stand with LRAD at ModI without loss-of-balance to demonstrate good safety awareness for independent mobility in home.     Goal: Pt Will Ambulate Pt will ambulate with LRAD at Supervision using a step-through pattern and equal step length for a distances greater than 210ft to demonstrate the ability to perform safe household distance ambulation at discharge.

## 2014-11-22 NOTE — Care Management Note (Signed)
Case Management Note  Patient Details  Name: [name] MRN: [id number] Date of Birth: 1990-11-19  Subjective/Objective:   Extubated yesterday. Patient agiated. Sitter at bedside. Following                  Action/Plan:   Expected Discharge Date:                  Expected Discharge Plan:     In-House Referral:  Clinical Social Work  Discharge planning Services  CM Consult  Post Acute Care Choice:    Choice offered to:     DME Arranged:    DME Agency:     HH Arranged:    HH Agency:     Status of Service:  In process, will continue to follow  Medicare Important Message Given:    Date Medicare IM Given:    Medicare IM give by:    Date Additional Medicare IM Given:    Additional Medicare Important Message give by:     If discussed at Long Length of Stay Meetings, dates discussed:    Additional Comments:  Marily Memos, RN 11/22/2014, 9:24 AM

## 2014-11-22 NOTE — Progress Notes (Signed)
Patient ID: [name], male   DOB: Jan 20, 1991, 24 y.o.   MRN: [id number] [facility] PROGRESS NOTE  PCP: Derwood Kaplan, MD  HPI/Subjective: Patient stated that he was feeling better. Still has some cough. No shortness of breath. Asking for cigarettes.  Objective: Filed Vitals:   11/22/14 1000  BP: 121/78  Pulse: 99  Temp:   Resp: 19    Filed Weights   11/21/14 0636 11/21/14 2030 11/22/14 0500  Weight: 132.6 kg (292 lb 5.3 oz) 142.8 kg (314 lb 13.1 oz) 119.4 kg (263 lb 3.7 oz)    ROS: Review of Systems  Constitutional: Negative for fever and chills.  Eyes: Negative for blurred vision.  Respiratory: Positive for cough. Negative for shortness of breath.   Cardiovascular: Negative for chest pain.  Gastrointestinal: Negative for nausea, vomiting, abdominal pain, diarrhea and constipation.  Genitourinary: Negative for dysuria.  Musculoskeletal: Negative for joint pain.  Neurological: Negative for dizziness and headaches.    Exam: Physical Exam  HENT:  Nose: No mucosal edema.  Eyes: Conjunctivae, EOM and lids are normal. Pupils are equal, round, and reactive to light.  Neck: No JVD present. Carotid bruit is not present. No edema present. No thyroid mass and no thyromegaly present.  Cardiovascular: S1 normal and S2 normal.  Exam reveals no gallop.   No murmur heard. Pulses:      Dorsalis pedis pulses are 2+ on the right side, and 2+ on the left side.  Respiratory: No respiratory distress. He has no wheezes. He has rhonchi in the right lower field and the left lower field. He has no rales.  GI: Soft. Bowel sounds are normal. There is no tenderness.  Musculoskeletal:       Right ankle: He exhibits swelling.       Left ankle: He exhibits swelling.  Lymphadenopathy:    He has no cervical adenopathy.  Neurological: He is alert.  Patient able to move all extremities on his own.  Skin: Skin is warm. Nails show no clubbing.  Some scabbing around the right  neck area and chest  Psychiatric: His mood appears anxious.    Data Reviewed: Basic Metabolic Panel:  Recent Labs Lab 11/16/14 0512  11/17/14 1744 11/18/14 0601 11/19/14 0444 11/20/14 0442 11/21/14 0533 11/22/14 0624  NA 147*  < >  --  146* 148* 144 141 142  K 3.8  < > 3.2* 3.4* 4.0 3.8 4.2 3.7  CL 117*  < >  --  113* 110 102 98* 107  CO2 26  < >  --  28 33* 33* 31 27  GLUCOSE 128*  < >  --  100* 107* 118* 132* 107*  BUN 10  < >  --  11 13 17 17 20   CREATININE 0.90  < >  --  0.57* 0.73 0.71 0.55* 0.57*  CALCIUM 7.7*  < >  --  8.2* 8.4* 8.2* 8.6* 8.6*  MG 1.9  --  2.0 1.9 2.2  --   --   --   PHOS 4.1  --   --   --  3.9  --   --   --   < > = values in this interval not displayed. Liver Function Tests:  Recent Labs Lab 11/17/14 0505 11/18/14 0601 11/19/14 0444 11/21/14 0533  AST 39 33 50* 38  ALT 21 24 34 50  ALKPHOS 45 51 72 78  BILITOT 0.9 1.1 0.7 1.1  PROT 5.0* 5.0* 5.8* 7.0  ALBUMIN  2.3* 2.3* 2.5* 2.9*   CBC:  Recent Labs Lab 11/18/14 0601 11/19/14 0444 11/20/14 0442 11/21/14 0533 11/22/14 0624  WBC 9.2 11.3* 14.7* 15.4* 14.4*  HGB 11.3* 12.3* 12.7* 13.4 13.3  HCT 34.0* 37.2* 38.7* 40.4 39.1*  MCV 89.6 90.5 89.6 89.2 88.0  PLT 183 196 217 228 234   CBG:  Recent Labs Lab 11/20/14 1647 11/21/14 0047 11/21/14 0716 11/21/14 1615 11/22/14 0009  GLUCAP 112* 145* 105* 115* 101*    Recent Results (from the past 240 hour(s))  MRSA PCR Screening     Status: None   Collection Time: 11/13/14  3:40 AM  Result Value Ref Range Status   MRSA by PCR NEGATIVE NEGATIVE Final    Comment:        The GeneXpert MRSA Assay (FDA approved for NASAL specimens only), is one component of a comprehensive MRSA colonization surveillance program. It is not intended to diagnose MRSA infection nor to guide or monitor treatment for MRSA infections.   Culture, bal-quantitative     Status: None   Collection Time: 11/15/14  9:53 AM  Result Value Ref Range Status    Specimen Description BRONCHIAL ALVEOLAR LAVAGE  Final   Special Requests NONE  Final   Gram Stain   Final    MANY WBC SEEN FEW GRAM POSITIVE COCCI RARE GRAM POSITIVE RODS EXCELLENT SPECIMEN - 90-100% WBCS    Culture MODERATE GROWTH STAPHYLOCOCCUS AUREUS  Final   Report Status 11/18/2014 FINAL  Final   Organism ID, Bacteria STAPHYLOCOCCUS AUREUS  Final      Susceptibility   Staphylococcus aureus - MIC*    CIPROFLOXACIN Value in next row Sensitive      SENSITIVE<=0.5    ERYTHROMYCIN Value in next row Sensitive      SENSITIVE<=0.25    GENTAMICIN Value in next row Sensitive      SENSITIVE<=0.5    OXACILLIN Value in next row Sensitive      SENSITIVE<=0.25    TETRACYCLINE Value in next row Sensitive      SENSITIVE<=1    CLINDAMYCIN Value in next row Sensitive      SENSITIVE<=0.25    CEFOXITIN SCREEN Value in next row Sensitive      SENSITIVE<=0.25    Inducible Clindamycin Value in next row Sensitive      SENSITIVE<=0.25    LINEZOLID Value in next row Sensitive      SENSITIVE2    TRIMETH/SULFA Value in next row Sensitive      SENSITIVE<=10    * MODERATE GROWTH STAPHYLOCOCCUS AUREUS  Culture, blood (routine x 2)     Status: None (Preliminary result)   Collection Time: 11/20/14  9:18 AM  Result Value Ref Range Status   Specimen Description BLOOD RIGHT ARM  Final   Special Requests BOTTLES DRAWN AEROBIC AND ANAEROBIC  7CC  Final   Culture NO GROWTH 2 DAYS  Final   Report Status PENDING  Incomplete  Culture, blood (routine x 2)     Status: None (Preliminary result)   Collection Time: 11/20/14  9:20 AM  Result Value Ref Range Status   Specimen Description BLOOD LEFT ARM  Final   Special Requests   Final    BOTTLES DRAWN AEROBIC AND ANAEROBIC  6 CC AERO 5 CC ANAERO   Culture  Setup Time   Final    GRAM POSITIVE COCCI IN CLUSTERS CRITICAL RESULT CALLED TO, READ BACK BY AND VERIFIED WITH: Haynes Bast GREGORY AT 1325 11/21/14 DV ANAEROBIC BOTTLE ONLY  Culture   Final     COAGULASE NEGATIVE STAPHYLOCOCCUS ANAEROBIC BOTTLE ONLY Results consistent with contamination.    Report Status PENDING  Incomplete  Culture, respiratory (NON-Expectorated)     Status: None (Preliminary result)   Collection Time: 11/20/14 12:00 PM  Result Value Ref Range Status   Specimen Description TRACHEAL ASPIRATE  Final   Special Requests NONE  Final   Gram Stain   Final    FEW WBC SEEN RARE GRAM POSITIVE COCCI IN CLUSTERS GOOD SPECIMEN - 80-90% WBCS    Culture MODERATE GROWTH STAPHYLOCOCCUS AUREUS  Final   Report Status PENDING  Incomplete   Organism ID, Bacteria STAPHYLOCOCCUS AUREUS  Final      Susceptibility   Staphylococcus aureus - MIC*    CIPROFLOXACIN <=0.5 SENSITIVE Sensitive     GENTAMICIN <=0.5 SENSITIVE Sensitive     OXACILLIN <=0.25 SENSITIVE Sensitive     TRIMETH/SULFA <=10 SENSITIVE Sensitive     CEFOXITIN SCREEN NEGATIVE Sensitive     Inducible Clindamycin NEGATIVE Sensitive     TETRACYCLINE Value in next row Sensitive      SENSITIVE<=1    * MODERATE GROWTH STAPHYLOCOCCUS AUREUS  Cath Tip Culture     Status: None   Collection Time: 11/20/14  4:32 PM  Result Value Ref Range Status   Specimen Description CATH TIP  Final   Special Requests NONE  Final   Culture NO GROWTH 2 DAYS  Final   Report Status 11/22/2014 FINAL  Final     Studies: Dg Chest Port 1 View  11/21/2014   CLINICAL DATA:  Respiratory failure  EXAM: PORTABLE CHEST - 1 VIEW  COMPARISON:  Portable chest x-ray of 11/20/2014  FINDINGS: The tip of the endotracheal tube is unchanged in position well above the carina. and NG tube remains. The right central venous line has been removed and no pneumothorax is seen. There is mild pulmonary vascular congestion present, with perhaps slightly improved aeration. Cardiomegaly is stable.  IMPRESSION: 1. Right central venous line removed.  No pneumothorax. 2. Mild pulmonary vascular congestion remains. Minimally improved aeration.   Electronically Signed   By:  Dwyane Dee M.D.   On: 11/21/2014 08:02   Dg Chest Port 1 View  11/20/2014   CLINICAL DATA:  PICC line placement  EXAM: PORTABLE CHEST - 1 VIEW  COMPARISON:  11/20/2014  FINDINGS: Left PICC line is in place with the tip at the cavoatrial junction. Endotracheal tube, right central line and NG tube are unchanged. Improving aeration in the lungs with decreasing bilateral airspace disease. Low lung volumes with bibasilar atelectasis and mild vascular congestion. No effusions.  IMPRESSION: Left PICC line tip at the cavoatrial junction.  Improving bilateral airspace disease. Continued low volumes, vascular congestion and bibasilar atelectasis.   Electronically Signed   By: Charlett Nose M.D.   On: 11/20/2014 15:30    Scheduled Meds: .  ceFAZolin (ANCEF) IV  1 g Intravenous 4 times per day  . enoxaparin (LOVENOX) injection  40 mg Subcutaneous Q24H  . Influenza vac split quadrivalent PF  0.5 mL Intramuscular Tomorrow-1000  . [START ON 11/23/2014] levothyroxine  75 mcg Oral QAC breakfast  . QUEtiapine  100 mg Oral q morning - 10a  . QUEtiapine  200 mg Oral QHS   Continuous Infusions: . dexmedetomidine 0.9 mcg/kg/hr (11/22/14 1105)    Assessment/Plan: 1. Acute respiratory failure- resolved. Now breathing on room air 2. Aspiration pneumonia- staph aureus growing out of the BAL. on Ancef. 3. Acute encephalopathy- will have  to stay in the ICU while on Precedex drip. When coming down off the drip seems to act up more. Seroquel restarted. 4. Morbid obesity 5. Hypothyroidism continue levothyroxine. 6. Bipolar disorder- psychiatry to assess when able to answer questions.  7. Tobacco abuse- Nicotrol inhaler ordered   Code Status:     Code Status Orders        Start     Ordered   11/13/14 0345  Full code   Continuous     11/13/14 0344     Disposition Plan: to be determined  Consultants:  Critical care specialist  Psychiatry  Time spent: 20 minutes  Alford Highland  [facility]  Hospitalists

## 2014-11-22 NOTE — Progress Notes (Signed)
PULMONARY / CRITICAL CARE MEDICINE   Name: [name] MRN: [id number] DOB: 10-08-1990     RASS -0 to +1 on prcedex gtt this AM. + F/C No respiratory distress  Filed Vitals:   11/22/14 0900 11/22/14 1000 11/22/14 1400 11/22/14 1500  BP: 116/63 121/78 132/78 128/75  Pulse: 106 99 108   Temp:    99.7 F (37.6 C)  TempSrc:    Oral  Resp:  Height:      Weight:      SpO2: 93% 94% 95%    RASS 0 to +1, + F/C  HEENT WNL JVP cannot be assessed Diffuse rhonchi Tachy, reg, no M noted Obese, soft, NT, +BS No LE edema MAEs, no focal deficits  BMP Latest Ref Rng 11/22/2014 11/21/2014 11/20/2014  Glucose 65 - 99 mg/dL 865(H) 846(N) 629(B)  BUN 6 - 20 mg/dL Creatinine 0.61 - 1.24 mg/dL 2.84(X) 3.24(M) 0.10  Sodium 135 - 145 mmol/L 142 141 144  Potassium 3.5 - 5.1 mmol/L 3.7 4.2 3.8  Chloride 101 - 111 mmol/L 107 98(L) 102  CO2 22 - 32 mmol/L 27 31 33(H)  Calcium 8.9 - 10.3 mg/dL 2.7(O) 5.3(G) 6.4(Q)    CBC Latest Ref Rng 11/22/2014 11/21/2014 11/20/2014  WBC 3.8 - 10.6 K/uL 14.4(H) 15.4(H) 14.7(H)  Hemoglobin 13.0 - 18.0 g/dL 03.4 74.2 12.7(L)  Hematocrit 40.0 - 52.0 % 39.1(L) 40.4 38.7(L)  Platelets 150 - 440 K/uL 234 228 217    CXR: NNF  MAJOR EVENTS/TEST RESULTS: 9/06 UDS: + BZDs, + THC 9/07 CT head: NAD 9/14 ID consultation for fever, leukocytosis 9/14 Echocardiogram: LVEF 60%, grade 1 diastolic dysfunction, LA mildly dilated  INDWELLING DEVICES: R radial A-line 9/11 >> 9/12 R IJ CVL 9/09 >> 9/14 ETT 9/06 >> 9/09, 9/09 >> 9/15 LUE PICC 9/14 >>   MICRO DATA: MRSA PCR 9/07 >> NEG BAL 9/09 >> MSSA Resp 9/14 >> MSSA Cath tip cx 9/14 >> NEG Blood 9/14 >> 1/2 coag neg staph >>   ANTIMICROBIALS:  Clinda 9/09 >> 9/10 Vanc 9/10 >> 9/12 Pip-tazo 9/10 >> 9/12 Ceftriaxone 9/12 >> 9/14 Vanc 9/14 >> 9/16 Pip-tazo 9/14 >> 9/16 Cefazolin 9/16 >> (recommended stop date 9//24)   ASSESSMENT / PLAN:  PULMONARY A: Acute respiratory failure,  hypoxic Pulm infiltrates - improving Mucus plugging, resolved P:   Supplemental O2 to maintain SpO2 > 88%  CARDIOVASCULAR A:  Transient hypotension, resolved P:  Monitor  RENAL A:   Hypervolemia, resolved Hypernatremia, resolved Hypokalemia, resolved P:   Monitor BMET intermittently Monitor I/Os Correct electrolytes as indicated  GASTROINTESTINAL A:   Mild abd distention, resolved Constipation, resolved Post extubation dysphagia, resolved diarrhea P:   SUP: N/I post extubation Advance diet as tolerated Cont bowel regimen  HEMATOLOGIC A:   Mild ICU acquired anemia P:  DVT px: SQ heparin Monitor CBC intermittently Transfuse per usual guidelines  INFECTIOUS A:   MSSA PNA P:   Monitor temp, WBC count Micro and abx as above All abx per ID Service  ENDOCRINE A:  No issues P:   CBG q 8 hrs SSI for glu > 180  NEUROLOGIC A:   Polysubstance abuse Bipolar D/O Severe agitated delirium P:   RASS goal: 0 Resume scheduled quetiapine Cont PRN haloperidol Wean dexmedetomidine gtt as tolerated   He may be transferred out of ICU/SDU once off dexmedetomidine    [name], MD PCCM service Mobile [phone] Pager [phone]  11/22/2014, 3:25 PM

## 2014-11-22 NOTE — Progress Notes (Signed)
Pt agitated, trying to get out of bed.  Pt states "I'm going home."  Pt redirected multiple times, without success.  Pt given Haldol per PRN orders. See MAR.

## 2014-11-22 NOTE — Progress Notes (Signed)
Nutrition Follow-up    INTERVENTION:   Meals and Snacks: Cater to patient preferences Medical Food Supplement Therapy: if po intake inadequate on follow-up, recommend addition of nutritional supplement  NUTRITION DIAGNOSIS:   Inadequate oral intake related to acute illness as evidenced by NPO status. Being addressed as diet advanced post extubation  GOAL:   Patient will meet greater than or equal to 90% of their needs  MONITOR:    (Energy Intake, Anthropometrics, Electrolyte/Renal Profile, Digestive System)   ASSESSMENT:    Pt remains extubated, on room air, precedex being titrated down this AM but pt increasing agitated/angry, plan for precedex to be tirated back up, coughing up some blood this AM  Diet Order:  Diet regular Room service appropriate?: Yes with Assist; Fluid consistency:: Thin   Energy Intake: no intake recorded, just received breakfast tray on rounds this AM  Last BM:  9/16   Electrolyte and Renal Profile:  Recent Labs Lab 11/16/14 0512  11/17/14 1744 11/18/14 0601 11/19/14 0444 11/20/14 0442 11/21/14 0533 11/22/14 0624  BUN 10  < >  --  CREATININE 0.90  < >  --  0.57* 0.73 0.71 0.55* 0.57*  NA 147*  < >  --  146* 148* 144 141 142  K 3.8  < > 3.2* 3.4* 4.0 3.8 4.2 3.7  MG 1.9  --  2.0 1.9 2.2  --   --   --   PHOS 4.1  --   --   --  3.9  --   --   --   < > = values in this interval not displayed. Glucose Profile:  Recent Labs  11/21/14 0716 11/21/14 1615 11/22/14 0009  GLUCAP 105* 115* 101*   Protein Profile:  Recent Labs Lab 11/18/14 0601 11/19/14 0444 11/21/14 0533  ALBUMIN 2.3* 2.5* 2.9*   Nutritional Anemia Profile:  CBC Latest Ref Rng 11/22/2014 11/21/2014 11/20/2014  WBC 3.8 - 10.6 K/uL 14.4(H) 15.4(H) 14.7(H)  Hemoglobin 13.0 - 18.0 g/dL 16.1 09.6 12.7(L)  Hematocrit 40.0 - 52.0 % 39.1(L) 40.4 38.7(L)  Platelets 150 - 440 K/uL 234 228 217    Meds: precedex  Height:   Ht Readings from Last 1 Encounters:   11/16/14  (1.803 m)    Weight:   Wt Readings from Last 1 Encounters:  11/22/14 263 lb 3.7 oz (119.4 kg)   BMI:  Body mass index is 36.73 kg/(m^2).  Estimated Nutritional Needs:   Kcal:  BEE 1792 kcals (IF 1.0-1.3, AF 1.2) 0454-0981 kcals/d.Using IBW of 78kg  Protein:  (1.2-1.5 g/kg) Using IBW of 78kg94-117 gm/d  Fluid:  (25-66ml/kg) 1950-2322ml/d  HIGH Care Level  Romelle Starcher MS, RD, LDN [phone] Pager

## 2014-11-22 NOTE — Evaluation (Signed)
Physical Therapy Evaluation Patient Details Name: [name] MRN: [id number] DOB: [birth year] Today's Date: 11/22/2014   History of Present Illness  Pt is a 24yo white male who presented to [facility] via EMS p LOC at home, wherein subsequently it was discovered that patient had overdosed on prescribed medications. Pt has a history of autism, bipolar disorder, and PTSD, and reportledly was well medicated at baseline. RN gathers from pt's sister that he has had multiple suicide attempts, and that the patient recently lost his mother and has since beenn demonstrating difficulty with the berievement.  Clinical Impression  Pt is received semirecumbent in bed upon entry, asleep but easily rousable. Once awake, pt remains drowsy, but is willing to participate. No acute distress noted. Pt is pleasant, but appears to have difficulty following simple commands 50% of the time, seemingly confused as he removes several lines without hesitation. Strength as assessed during funcitonal mobility assessment, presents with moderate weakness in the trunk and BLE, limiting ability to tolerate minimal physical activity without rest breaks. Pt falls risk is high as evidenced by significant unsteadiness on feet and impulsivity.Pt recently extubated and remains on room air during session with O2sats WNL.. Patient presenting with impairment of strength, balance, and activity tolerance, limiting ability to perform ADL and mobility tasks at  baseline level of function. Patient will benefit from skilled intervention to address the above impairments and limitations, in order to restore to prior level of function, improve patient safety upon discharge, and to decrease falls risk. Pt will require assistance with all mobility to prevent additional LOB.      Follow Up Recommendations CIR;Supervision/Assistance - 24 hour;Supervision for mobility/OOB    Equipment Recommendations  None recommended by PT    Recommendations for Other  Services       Precautions / Restrictions Precautions Precaution Comments: Pt has been sedated throughout this visit due to aggressive and impulsive behavior. RN reports he has been mostly awake and runctional, but pt demonstrating some AMS consistent c typical sedation means.  Restrictions Weight Bearing Restrictions: No      Mobility  Bed Mobility Overal bed mobility: Needs Assistance Bed Mobility: Supine to Sit;Sit to Supine     Supine to sit: Supervision Sit to supine: Max assist   General bed mobility comments: Impulsive, sits, stands without warning, not always following simply commands. Falls back ward into bed once done, unable to straighten bodty, or assist with scooting.   Transfers Overall transfer level: Needs assistance Equipment used: Rolling walker (2 wheeled) Transfers: Sit to/from Stand Sit to Stand: Min guard         General transfer comment: Inconsistent use of RW, adn confusion on how to grasp; able to perform 5+x to perform ambulaiiton trails and therex.   Ambulation/Gait Ambulation/Gait assistance: Min guard Ambulation Distance (Feet): 12 Feet Assistive device: Rolling walker (2 wheeled) Gait Pattern/deviations: Drifts right/left;Staggering right;Staggering left;Leaning posteriorly     General Gait Details: Pt demonstrating weakness in BLE/trunk, with ataxic motions, demonstrating pooor groos motor control. Pt reports sustained lightheadedness throughout trial. Difficulty alking in a straight line despite multiple verbal cues.   Stairs            Wheelchair Mobility    Modified Rankin (Stroke Patients Only)       Balance Overall balance assessment: Modified Independent;Needs assistance  Pertinent Vitals/Pain Pain Assessment: Faces Faces Pain Scale: Hurts a little bit Pain Location: generally sore throughout his trunk; does not illiterate further.  Pain Intervention(s):  Limited activity within patient's tolerance;Monitored during session    Home Living Family/patient expects to be discharged to:: Private residence Living Arrangements: Parent Available Help at Discharge: Family             Additional Comments: Pt presenting c AMS, and difficulty speaking s/p extubation. Unable to get much information on home environment at thistime.     Prior Function Level of Independence: Independent               Hand Dominance   Dominant Hand: Right    Extremity/Trunk Assessment   Upper Extremity Assessment: Overall WFL for tasks assessed           Lower Extremity Assessment: Generalized weakness (Limited ability to participate in exercise at this time, requires frequent breaks. )      Cervical / Trunk Assessment: Normal  Communication   Communication: Tracheostomy (very soft spoken, fatigued. )  Cognition Arousal/Alertness: Lethargic;Suspect due to medications Behavior During Therapy: Impulsive;Flat affect (Agreeable to work with PT; impulsively removing O2 monitor and telemetry electrode confused. ) Overall Cognitive Status: Difficult to assess Area of Impairment: Following commands;Attention   Current Attention Level: Selective;Alternating   Following Commands: Follows one step commands inconsistently            General Comments General comments (skin integrity, edema, etc.): very unsteady on feet, requires supervision for all mobility at this time.     Exercises General Exercises - Lower Extremity Long Arc Quad: AROM;Both;10 reps Heel Raises: AROM;Both;10 reps (requires multiple cues to achieve correct performance. )      Assessment/Plan    PT Assessment Patient needs continued PT services  PT Diagnosis Difficulty walking;Abnormality of gait;Generalized weakness;Altered mental status   PT Problem List    PT Treatment Interventions DME instruction;Gait training;Functional mobility training;Stair training;Therapeutic  activities;Therapeutic exercise;Balance training;Patient/family education   PT Goals (Current goals can be found in the Care Plan section) Acute Rehab PT Goals PT Goal Formulation: Patient unable to participate in goal setting    Frequency Min 2X/week   Barriers to discharge        Co-evaluation               End of Session Equipment Utilized During Treatment: Gait belt Activity Tolerance: Patient tolerated treatment well;Patient limited by fatigue;Patient limited by lethargy Patient left: in bed;with nursing/sitter in room Nurse Communication: Mobility status;Other (comment) (keep chair nearby during furture mobility sessions. )         Time: 1610-9604 PT Time Calculation (min) (ACUTE ONLY): 18 min   Charges:   PT Evaluation $Initial PT Evaluation Tier I: 1 Procedure PT Treatments $Therapeutic Activity: 8-22 mins   PT G Codes:        Buccola,Allan C 12-03-2014, 3:46 PM  3:50 PM  Rosamaria Lints, PT, DPT [place] License # [id number]

## 2014-11-22 NOTE — Progress Notes (Signed)
Rehab Admissions Coordinator Note:  Patient was screened by Clois Dupes for appropriateness for an Inpatient Acute Rehab Consult per PT recommendation.  At this time, we are recommending await further progress over the weekend to determine dispo needs. Noted  on sedation and having difficulty following commands. I will follow up on Monday.  Clois Dupes 11/22/2014, 4:19 PM  I can be reached at [phone].

## 2014-11-23 LAB — CULTURE, RESPIRATORY W GRAM STAIN

## 2014-11-23 LAB — CULTURE, RESPIRATORY

## 2014-11-23 MED ORDER — LAMOTRIGINE 25 MG PO TABS
150.0000 mg | ORAL_TABLET | Freq: Two times a day (BID) | ORAL | Status: DC
  Administered 2014-11-23 – 2014-11-25 (×5): 150 mg via ORAL
  Filled 2014-11-23: qty 2
  Filled 2014-11-23: qty 1
  Filled 2014-11-23: qty 2
  Filled 2014-11-23: qty 1
  Filled 2014-11-23: qty 2

## 2014-11-23 MED ORDER — AMOXICILLIN-POT CLAVULANATE 875-125 MG PO TABS
1.0000 | ORAL_TABLET | Freq: Two times a day (BID) | ORAL | Status: DC
Start: 2014-11-23 — End: 2014-11-25
  Administered 2014-11-23 – 2014-11-25 (×4): 1 via ORAL
  Filled 2014-11-23 (×6): qty 1

## 2014-11-23 MED ORDER — CITALOPRAM HYDROBROMIDE 20 MG PO TABS
40.0000 mg | ORAL_TABLET | Freq: Every day | ORAL | Status: DC
  Administered 2014-11-23 – 2014-11-25 (×3): 40 mg via ORAL
  Filled 2014-11-23 (×3): qty 2

## 2014-11-23 NOTE — Progress Notes (Signed)
Patient ID: [name], male   DOB: 1990/03/18, 24 y.o.   MRN: [id number] [facility] PROGRESS NOTE  PCP: Derwood Kaplan, MD  HPI/Subjective: Patient set up when I came in the room. He states that he feels okay. He is asking about his usual medications Lamictal and Celexa. He admits that he drank a few beers and took oxycodone and Xanax. He states he has no thoughts of hurting himself or other people.  Objective: Filed Vitals:   11/23/14 1145  BP: 105/61  Pulse: 71  Temp:   Resp: 26    Filed Weights   11/21/14 0636 11/21/14 2030 11/22/14 0500  Weight: 132.6 kg (292 lb 5.3 oz) 142.8 kg (314 lb 13.1 oz) 119.4 kg (263 lb 3.7 oz)    ROS: Review of Systems  Constitutional: Negative for fever and chills.  Eyes: Negative for blurred vision.  Respiratory: Negative for cough and shortness of breath.   Cardiovascular: Negative for chest pain.  Gastrointestinal: Negative for nausea, vomiting, abdominal pain, diarrhea and constipation.  Genitourinary: Negative for dysuria.  Musculoskeletal: Negative for joint pain.  Neurological: Negative for dizziness and headaches.    Exam: Physical Exam  HENT:  Nose: No mucosal edema.  Eyes: Conjunctivae, EOM and lids are normal. Pupils are equal, round, and reactive to light.  Neck: No JVD present. Carotid bruit is not present. No edema present. No thyroid mass and no thyromegaly present.  Cardiovascular: S1 normal and S2 normal.  Exam reveals no gallop.   No murmur heard. Pulses:      Dorsalis pedis pulses are 2+ on the right side, and 2+ on the left side.  Respiratory: No respiratory distress. He has no wheezes. He has no rhonchi. He has no rales.  GI: Soft. Bowel sounds are normal. There is no tenderness.  Musculoskeletal:       Right ankle: He exhibits swelling.       Left ankle: He exhibits swelling.  Lymphadenopathy:    He has no cervical adenopathy.  Neurological: He is alert. No cranial nerve deficit.  Patient  able to move all extremities on his own.  Skin: Skin is warm. Nails show no clubbing.  Some scabbing around the right neck area and chest  Psychiatric: He has a normal mood and affect.    Data Reviewed: Basic Metabolic Panel:  Recent Labs Lab 11/17/14 1744 11/18/14 0601 11/19/14 0444 11/20/14 0442 11/21/14 0533 11/22/14 0624  NA  --  146* 148* 144 141 142  K 3.2* 3.4* 4.0 3.8 4.2 3.7  CL  --  113* 110 102 98* 107  CO2  --  28 33* 33* 31 27  GLUCOSE  --  100* 107* 118* 132* 107*  BUN  --  11 13 17 17 20   CREATININE  --  0.57* 0.73 0.71 0.55* 0.57*  CALCIUM  --  8.2* 8.4* 8.2* 8.6* 8.6*  MG 2.0 1.9 2.2  --   --   --   PHOS  --   --  3.9  --   --   --    Liver Function Tests:  Recent Labs Lab 11/17/14 0505 11/18/14 0601 11/19/14 0444 11/21/14 0533  AST 39 33 50* 38  ALT 21 24 34 50  ALKPHOS 45 51 72 78  BILITOT 0.9 1.1 0.7 1.1  PROT 5.0* 5.0* 5.8* 7.0  ALBUMIN 2.3* 2.3* 2.5* 2.9*   CBC:  Recent Labs Lab 11/18/14 0601 11/19/14 0444 11/20/14 0442 11/21/14 0533 11/22/14 7829  WBC 9.2 11.3* 14.7* 15.4* 14.4*  HGB 11.3* 12.3* 12.7* 13.4 13.3  HCT 34.0* 37.2* 38.7* 40.4 39.1*  MCV 89.6 90.5 89.6 89.2 88.0  PLT 183 196 217 228 234   CBG:  Recent Labs Lab 11/21/14 0047 11/21/14 0716 11/21/14 1615 11/22/14 0009 11/22/14 1618  GLUCAP 145* 105* 115* 101* 110*    Recent Results (from the past 240 hour(s))  Culture, bal-quantitative     Status: None   Collection Time: 11/15/14  9:53 AM  Result Value Ref Range Status   Specimen Description BRONCHIAL ALVEOLAR LAVAGE  Final   Special Requests NONE  Final   Gram Stain   Final    MANY WBC SEEN FEW GRAM POSITIVE COCCI RARE GRAM POSITIVE RODS EXCELLENT SPECIMEN - 90-100% WBCS    Culture MODERATE GROWTH STAPHYLOCOCCUS AUREUS  Final   Report Status 11/18/2014 FINAL  Final   Organism ID, Bacteria STAPHYLOCOCCUS AUREUS  Final      Susceptibility   Staphylococcus aureus - MIC*    CIPROFLOXACIN Value in next  row Sensitive      SENSITIVE<=0.5    ERYTHROMYCIN Value in next row Sensitive      SENSITIVE<=0.25    GENTAMICIN Value in next row Sensitive      SENSITIVE<=0.5    OXACILLIN Value in next row Sensitive      SENSITIVE<=0.25    TETRACYCLINE Value in next row Sensitive      SENSITIVE<=1    CLINDAMYCIN Value in next row Sensitive      SENSITIVE<=0.25    CEFOXITIN SCREEN Value in next row Sensitive      SENSITIVE<=0.25    Inducible Clindamycin Value in next row Sensitive      SENSITIVE<=0.25    LINEZOLID Value in next row Sensitive      SENSITIVE2    TRIMETH/SULFA Value in next row Sensitive      SENSITIVE<=10    * MODERATE GROWTH STAPHYLOCOCCUS AUREUS  Culture, blood (routine x 2)     Status: None (Preliminary result)   Collection Time: 11/20/14  9:18 AM  Result Value Ref Range Status   Specimen Description BLOOD RIGHT ARM  Final   Special Requests BOTTLES DRAWN AEROBIC AND ANAEROBIC  7CC  Final   Culture NO GROWTH 3 DAYS  Final   Report Status PENDING  Incomplete  Culture, blood (routine x 2)     Status: None (Preliminary result)   Collection Time: 11/20/14  9:20 AM  Result Value Ref Range Status   Specimen Description BLOOD LEFT ARM  Final   Special Requests   Final    BOTTLES DRAWN AEROBIC AND ANAEROBIC  6 CC AERO 5 CC ANAERO   Culture  Setup Time   Final    GRAM POSITIVE COCCI IN CLUSTERS CRITICAL RESULT CALLED TO, READ BACK BY AND VERIFIED WITH: JAMIE SMITH GREGORY AT 1325 11/21/14 DV ANAEROBIC BOTTLE ONLY    Culture   Final    COAGULASE NEGATIVE STAPHYLOCOCCUS ANAEROBIC BOTTLE ONLY Results consistent with contamination.    Report Status PENDING  Incomplete  Culture, respiratory (NON-Expectorated)     Status: None   Collection Time: 11/20/14 12:00 PM  Result Value Ref Range Status   Specimen Description TRACHEAL ASPIRATE  Final   Special Requests NONE  Final   Gram Stain   Final    FEW WBC SEEN RARE GRAM POSITIVE COCCI IN CLUSTERS GOOD SPECIMEN - 80-90% WBCS     Culture MODERATE GROWTH STAPHYLOCOCCUS AUREUS  Final   Report Status  11/23/2014 FINAL  Final   Organism ID, Bacteria STAPHYLOCOCCUS AUREUS  Final      Susceptibility   Staphylococcus aureus - MIC*    CIPROFLOXACIN <=0.5 SENSITIVE Sensitive     GENTAMICIN <=0.5 SENSITIVE Sensitive     OXACILLIN <=0.25 SENSITIVE Sensitive     TRIMETH/SULFA <=10 SENSITIVE Sensitive     CEFOXITIN SCREEN NEGATIVE Sensitive     Inducible Clindamycin NEGATIVE Sensitive     TETRACYCLINE Value in next row Sensitive      SENSITIVE<=1    * MODERATE GROWTH STAPHYLOCOCCUS AUREUS  Cath Tip Culture     Status: None   Collection Time: 11/20/14  4:32 PM  Result Value Ref Range Status   Specimen Description CATH TIP  Final   Special Requests NONE  Final   Culture NO GROWTH 2 DAYS  Final   Report Status 11/22/2014 FINAL  Final     Studies: No results found.  Scheduled Meds: . amoxicillin-clavulanate  1 tablet Oral Q12H  . citalopram  40 mg Oral Daily  . enoxaparin (LOVENOX) injection  40 mg Subcutaneous Q24H  . Influenza vac split quadrivalent PF  0.5 mL Intramuscular Tomorrow-1000  . lamoTRIgine  150 mg Oral BID  . levothyroxine  75 mcg Oral QAC breakfast  . QUEtiapine  100 mg Oral q morning - 10a  . QUEtiapine  200 mg Oral QHS   Continuous Infusions: . dexmedetomidine 1 mcg/kg/hr (11/23/14 1134)    Assessment/Plan: 1. Acute respiratory failure- resolved. Now breathing on room air 2. Aspiration pneumonia- staph aureus growing out of the BAL. Switched to by mouth Augmentin. 3. Acute encephalopathy- will have to stay in the ICU while on Precedex drip. We'll see if we can stop the Precedex drip today. Patient on Seroquel. Restart Lamictal and Celexa. 4. Morbid obesity 5. Hypothyroidism continue levothyroxine. 6. Bipolar disorder- psychiatry unable to assess for yesterday. Since patient is more alert we'll see if he is they're able to assess today. 7. Tobacco abuse- Nicotrol inhaler ordered 8. DC Foley  catheter   Code Status:     Code Status Orders        Start     Ordered   11/13/14 0345  Full code   Continuous     11/13/14 0344     Disposition Plan: to be determined  Consultants:  Critical care specialist  Psychiatry  Time spent: 20 minutes  Alford Highland  [facility]

## 2014-11-24 MED ORDER — DIAZEPAM 5 MG/ML IJ SOLN
INTRAMUSCULAR | Status: AC
  Administered 2014-11-24: 5 mg via INTRAVENOUS
  Filled 2014-11-24: qty 2

## 2014-11-24 MED ORDER — GUAIFENESIN ER 600 MG PO TB12
600.0000 mg | ORAL_TABLET | Freq: Two times a day (BID) | ORAL | Status: DC
  Administered 2014-11-24 – 2014-11-25 (×3): 600 mg via ORAL
  Filled 2014-11-24 (×3): qty 1

## 2014-11-24 MED ORDER — DIAZEPAM 5 MG/ML IJ SOLN
5.0000 mg | Freq: Three times a day (TID) | INTRAMUSCULAR | Status: DC | PRN
  Administered 2014-11-24 – 2014-11-25 (×3): 5 mg via INTRAVENOUS
  Filled 2014-11-24 (×2): qty 2

## 2014-11-24 MED ORDER — IBUPROFEN 600 MG PO TABS
600.0000 mg | ORAL_TABLET | Freq: Four times a day (QID) | ORAL | Status: DC | PRN
  Administered 2014-11-24 – 2014-11-25 (×3): 600 mg via ORAL
  Filled 2014-11-24: qty 2
  Filled 2014-11-24 (×2): qty 1

## 2014-11-24 NOTE — Progress Notes (Signed)
   11/24/14 1253  Vitals  Temp 98.1 F (36.7 C)  Temp Source Oral  BP 138/79 mmHg  MAP (mmHg) 95  BP Location Right Arm  BP Method Automatic  Patient Position (if appropriate) Lying  ECG Heart Rate (!) 112  Resp 17  Oxygen Therapy  SpO2 95 %  O2 Device Room Air  Pre-WUA / WUA Start  [place] Agitation Sedation Scale (RASS) 0  RASS Goal 0  Pain Assessment  Pain Assessment 0-10  Pain Score 4  Pain Type Acute pain  Pain Location Head  Pain Orientation Mid  Pain Descriptors / Indicators Dull;Discomfort  Pain Intervention(s) Medication (See eMAR);RN made aware  pt d/c from precedex gtt, pt tol well, pt alert and calm, pt c/o headache nsaid given, nursing will cont to monitor

## 2014-11-24 NOTE — Progress Notes (Signed)
Patient ID: [name], male   DOB: 11/11/1990, 24 y.o.   MRN: [id number] [facility] PROGRESS NOTE  PCP: Derwood Kaplan, MD  HPI/Subjective: Patient feeling really good. He thinks his mood is stable. He tells me of hurting himself or other people. No complaints of shortness of breath. Still feels very weak.  Objective: Filed Vitals:   11/24/14 1253  BP: 138/79  Pulse:   Temp: 98.1 F (36.7 C)  Resp: 17    Filed Weights   11/21/14 0636 11/21/14 2030 11/22/14 0500  Weight: 132.6 kg (292 lb 5.3 oz) 142.8 kg (314 lb 13.1 oz) 119.4 kg (263 lb 3.7 oz)    ROS: Review of Systems  Constitutional: Negative for fever and chills.  Eyes: Negative for blurred vision.  Respiratory: Negative for cough and shortness of breath.   Cardiovascular: Negative for chest pain.  Gastrointestinal: Negative for nausea, vomiting, abdominal pain, diarrhea and constipation.  Genitourinary: Negative for dysuria.  Musculoskeletal: Negative for joint pain.  Neurological: Negative for dizziness and headaches.    Exam: Physical Exam  HENT:  Nose: No mucosal edema.  Eyes: Conjunctivae, EOM and lids are normal. Pupils are equal, round, and reactive to light.  Neck: No JVD present. Carotid bruit is not present. No edema present. No thyroid mass and no thyromegaly present.  Cardiovascular: S1 normal and S2 normal.  Exam reveals no gallop.   No murmur heard. Pulses:      Dorsalis pedis pulses are 2+ on the right side, and 2+ on the left side.  Respiratory: No respiratory distress. He has no wheezes. He has no rhonchi. He has no rales.  GI: Soft. Bowel sounds are normal. There is no tenderness.  Musculoskeletal:       Right ankle: He exhibits swelling.       Left ankle: He exhibits swelling.  Lymphadenopathy:    He has no cervical adenopathy.  Neurological: He is alert. No cranial nerve deficit.  Patient able to move all extremities on his own.  Skin: Skin is warm. Nails show no  clubbing.  Some scabbing around the right neck area and chest  Psychiatric: He has a normal mood and affect.    Data Reviewed: Basic Metabolic Panel:  Recent Labs Lab 11/17/14 1744 11/18/14 0601 11/19/14 0444 11/20/14 0442 11/21/14 0533 11/22/14 0624  NA  --  146* 148* 144 141 142  K 3.2* 3.4* 4.0 3.8 4.2 3.7  CL  --  113* 110 102 98* 107  CO2  --  28 33* 33* 31 27  GLUCOSE  --  100* 107* 118* 132* 107*  BUN  --  CREATININE  --  0.57* 0.73 0.71 0.55* 0.57*  CALCIUM  --  8.2* 8.4* 8.2* 8.6* 8.6*  MG 2.0 1.9 2.2  --   --   --   PHOS  --   --  3.9  --   --   --    Liver Function Tests:  Recent Labs Lab 11/18/14 0601 11/19/14 0444 11/21/14 0533  AST 33 50* 38  ALT 24 34 50  ALKPHOS 51 72 78  BILITOT 1.1 0.7 1.1  PROT 5.0* 5.8* 7.0  ALBUMIN 2.3* 2.5* 2.9*   CBC:  Recent Labs Lab 11/18/14 0601 11/19/14 0444 11/20/14 0442 11/21/14 0533 11/22/14 0624  WBC 9.2 11.3* 14.7* 15.4* 14.4*  HGB 11.3* 12.3* 12.7* 13.4 13.3  HCT 34.0* 37.2* 38.7* 40.4 39.1*  MCV 89.6 90.5 89.6 89.2  88.0  PLT 183 196 217 228 234   CBG:  Recent Labs Lab 11/21/14 0047 11/21/14 0716 11/21/14 1615 11/22/14 0009 11/22/14 1618  GLUCAP 145* 105* 115* 101* 110*    Recent Results (from the past 240 hour(s))  Culture, bal-quantitative     Status: None   Collection Time: 11/15/14  9:53 AM  Result Value Ref Range Status   Specimen Description BRONCHIAL ALVEOLAR LAVAGE  Final   Special Requests NONE  Final   Gram Stain   Final    MANY WBC SEEN FEW GRAM POSITIVE COCCI RARE GRAM POSITIVE RODS EXCELLENT SPECIMEN - 90-100% WBCS    Culture MODERATE GROWTH STAPHYLOCOCCUS AUREUS  Final   Report Status 11/18/2014 FINAL  Final   Organism ID, Bacteria STAPHYLOCOCCUS AUREUS  Final      Susceptibility   Staphylococcus aureus - MIC*    CIPROFLOXACIN Value in next row Sensitive      SENSITIVE<=0.5    ERYTHROMYCIN Value in next row Sensitive      SENSITIVE<=0.25     GENTAMICIN Value in next row Sensitive      SENSITIVE<=0.5    OXACILLIN Value in next row Sensitive      SENSITIVE<=0.25    TETRACYCLINE Value in next row Sensitive      SENSITIVE<=1    CLINDAMYCIN Value in next row Sensitive      SENSITIVE<=0.25    CEFOXITIN SCREEN Value in next row Sensitive      SENSITIVE<=0.25    Inducible Clindamycin Value in next row Sensitive      SENSITIVE<=0.25    LINEZOLID Value in next row Sensitive      SENSITIVE2    TRIMETH/SULFA Value in next row Sensitive      SENSITIVE<=10    * MODERATE GROWTH STAPHYLOCOCCUS AUREUS  Culture, blood (routine x 2)     Status: None (Preliminary result)   Collection Time: 11/20/14  9:18 AM  Result Value Ref Range Status   Specimen Description BLOOD RIGHT ARM  Final   Special Requests BOTTLES DRAWN AEROBIC AND ANAEROBIC  7CC  Final   Culture NO GROWTH 4 DAYS  Final   Report Status PENDING  Incomplete  Culture, blood (routine x 2)     Status: None (Preliminary result)   Collection Time: 11/20/14  9:20 AM  Result Value Ref Range Status   Specimen Description BLOOD LEFT ARM  Final   Special Requests   Final    BOTTLES DRAWN AEROBIC AND ANAEROBIC  6 CC AERO 5 CC ANAERO   Culture  Setup Time   Final    GRAM POSITIVE COCCI IN CLUSTERS CRITICAL RESULT CALLED TO, READ BACK BY AND VERIFIED WITH: JAMIE SMITH GREGORY AT 1325 11/21/14 DV ANAEROBIC BOTTLE ONLY    Culture   Final    COAGULASE NEGATIVE STAPHYLOCOCCUS ANAEROBIC BOTTLE ONLY Results consistent with contamination.    Report Status PENDING  Incomplete  Culture, respiratory (NON-Expectorated)     Status: None   Collection Time: 11/20/14 12:00 PM  Result Value Ref Range Status   Specimen Description TRACHEAL ASPIRATE  Final   Special Requests NONE  Final   Gram Stain   Final    FEW WBC SEEN RARE GRAM POSITIVE COCCI IN CLUSTERS GOOD SPECIMEN - 80-90% WBCS    Culture MODERATE GROWTH STAPHYLOCOCCUS AUREUS  Final   Report Status 11/23/2014 FINAL  Final   Organism  ID, Bacteria STAPHYLOCOCCUS AUREUS  Final      Susceptibility   Staphylococcus aureus - MIC*  CIPROFLOXACIN <=0.5 SENSITIVE Sensitive     GENTAMICIN <=0.5 SENSITIVE Sensitive     OXACILLIN <=0.25 SENSITIVE Sensitive     TRIMETH/SULFA <=10 SENSITIVE Sensitive     CEFOXITIN SCREEN NEGATIVE Sensitive     Inducible Clindamycin NEGATIVE Sensitive     TETRACYCLINE Value in next row Sensitive      SENSITIVE<=1    * MODERATE GROWTH STAPHYLOCOCCUS AUREUS  Cath Tip Culture     Status: None   Collection Time: 11/20/14  4:32 PM  Result Value Ref Range Status   Specimen Description CATH TIP  Final   Special Requests NONE  Final   Culture NO GROWTH 2 DAYS  Final   Report Status 11/22/2014 FINAL  Final     Studies: No results found.  Scheduled Meds: . amoxicillin-clavulanate  1 tablet Oral Q12H  . citalopram  40 mg Oral Daily  . enoxaparin (LOVENOX) injection  40 mg Subcutaneous Q24H  . Influenza vac split quadrivalent PF  0.5 mL Intramuscular Tomorrow-1000  . lamoTRIgine  150 mg Oral BID  . levothyroxine  75 mcg Oral QAC breakfast  . QUEtiapine  100 mg Oral q morning - 10a  . QUEtiapine  200 mg Oral QHS   Continuous Infusions: . dexmedetomidine 0.2 mcg/kg/hr (11/24/14 1019)    Assessment/Plan: 1. Acute respiratory failure- resolved. Now breathing on room air 2. Aspiration pneumonia- staph aureus growing out of the BAL. Switched to by mouth Augmentin. 3. Acute encephalopathy- much improved. Patient on Seroquel. Restarted Lamictal and Celexa. 4. Morbid obesity 5. Hypothyroidism continue levothyroxine. 6. Bipolar disorder- psychiatry reevaluation. 7. Tobacco abuse- Nicotrol inhaler ordered 8. Transfer out of the ICU 9. Weakness physical therapy evaluation   Code Status:     Code Status Orders        Start     Ordered   11/13/14 0345  Full code   Continuous     11/13/14 0344     Disposition Plan: to be determined  Consultants:  Critical care  specialist  Psychiatry  Time spent: 20 minutes  Alford Highland  [facility]

## 2014-11-24 NOTE — Consult Note (Signed)
  Pt seen in Consultation in [facility] [facility]. S Pt is a 24 yr old white male who never worked so far and is single and lives with father and mother died in 04-26-2014 from Breast cancer.  Pt has logn H/O Mental illness and is being followed at [facility] by Dr Janeece Riggers and has apt coming u[p with  him on 11/26/2014. Pt reports that he was drinking too much alcohol and took Xanax that he had from an old prescription from Dr. Janeece Riggers as he does not prescribe  Xanax any more. M.s. Pt is seen sitting up in bed with i/i observation. Alert and ox3. Calm pleasant and co-operative. No agitation. Affect is appropriate to his mood. Admits that he feels low and down . When asked why he took pills and drank alcohol he reported "stupidity." No psychosis. Denies s/h ideas or plans and contracts for safety now. I/J guarded. Impulse control is poor. Bi-polar disorder Depressed. REc D/C IVC which is done. D/C One on one observation. Pt is to be re-evaluated in AM of 9/192016 By psychiatry for disposition planning as pt is eager to go home and keep his follow up apt with [facility] with Dr. Janeece Riggers on 11/26/2014

## 2014-11-24 NOTE — Progress Notes (Signed)
Pt tx to med-surg, pt tol well, pt verbalized understanding of tx, report called to receiving RN all questions answered,

## 2014-11-25 ENCOUNTER — Inpatient Hospital Stay
Admission: EM | Admit: 2014-11-25 | Discharge: 2014-11-26 | DRG: 204 | Discharge status: Against Medical Advice | Payer: Federal, State, Local not specified - PPO | Attending: Internal Medicine | Admitting: Internal Medicine

## 2014-11-25 ENCOUNTER — Encounter: Payer: Self-pay | Admitting: Emergency Medicine

## 2014-11-25 DIAGNOSIS — Z79899 Other long term (current) drug therapy: Secondary | ICD-10-CM

## 2014-11-25 DIAGNOSIS — F329 Major depressive disorder, single episode, unspecified: Secondary | ICD-10-CM | POA: Diagnosis present

## 2014-11-25 DIAGNOSIS — D72829 Elevated white blood cell count, unspecified: Secondary | ICD-10-CM | POA: Diagnosis present

## 2014-11-25 DIAGNOSIS — R0600 Dyspnea, unspecified: Secondary | ICD-10-CM | POA: Diagnosis not present

## 2014-11-25 DIAGNOSIS — Z882 Allergy status to sulfonamides status: Secondary | ICD-10-CM

## 2014-11-25 DIAGNOSIS — F17210 Nicotine dependence, cigarettes, uncomplicated: Secondary | ICD-10-CM | POA: Diagnosis present

## 2014-11-25 DIAGNOSIS — J690 Pneumonitis due to inhalation of food and vomit: Secondary | ICD-10-CM | POA: Diagnosis present

## 2014-11-25 DIAGNOSIS — F319 Bipolar disorder, unspecified: Secondary | ICD-10-CM | POA: Diagnosis present

## 2014-11-25 DIAGNOSIS — E039 Hypothyroidism, unspecified: Secondary | ICD-10-CM | POA: Diagnosis present

## 2014-11-25 HISTORY — DX: Major depressive disorder, single episode, unspecified: F32.9

## 2014-11-25 HISTORY — DX: Bipolar disorder, unspecified: F31.9

## 2014-11-25 HISTORY — DX: Post-traumatic stress disorder, unspecified: F43.10

## 2014-11-25 HISTORY — DX: Suicide attempt, initial encounter: T14.91XA

## 2014-11-25 HISTORY — DX: Depression, unspecified: F32.A

## 2014-11-25 HISTORY — DX: Hypothyroidism, unspecified: E03.9

## 2014-11-25 LAB — CULTURE, BLOOD (ROUTINE X 2): CULTURE: NO GROWTH

## 2014-11-25 MED ORDER — FAMOTIDINE 20 MG PO TABS
20.0000 mg | ORAL_TABLET | Freq: Two times a day (BID) | ORAL | Status: DC
  Administered 2014-11-25: 20 mg via ORAL
  Filled 2014-11-25: qty 1

## 2014-11-25 MED ORDER — AMOXICILLIN-POT CLAVULANATE 875-125 MG PO TABS: 1.0000 | ORAL_TABLET | Freq: Two times a day (BID) | ORAL | Status: DC

## 2014-11-25 MED ORDER — SODIUM CHLORIDE 0.9 % IV BOLUS (SEPSIS)
1000.0000 mL | Freq: Once | INTRAVENOUS | Status: AC
  Administered 2014-11-26: 1000 mL via INTRAVENOUS

## 2014-11-25 MED ORDER — QUETIAPINE FUMARATE 100 MG PO TABS: ORAL_TABLET | ORAL | Status: DC

## 2014-11-25 NOTE — Clinical Social Work Note (Signed)
Patient's IVC was discontinued over the weekend and patient is leaving AMA today. York Spaniel MSW,LCSW [phone]

## 2014-11-25 NOTE — Progress Notes (Signed)
Noted pt leaving AMA today. [phone]

## 2014-11-25 NOTE — Discharge Summary (Signed)
[facility] - [place] at [facility]   PATIENT NAME: [name]    MR#:  [id number]  DATE OF BIRTH:  Jun 05, 1990  DATE OF ADMISSION:  11/12/2014 ADMITTING PHYSICIAN: Arnaldo Natal, MD  DATE OF signing out AGAINST MEDICAL ADVICE: 11/25/2014  9:40 AM  PRIMARY CARE PHYSICIAN: Derwood Kaplan, MD    ADMISSION DIAGNOSIS:  Acute respiratory failure, unspecified whether with hypoxia or hypercapnia [J96.00] Overdose, undetermined intent, initial encounter [T50.904A]  DISCHARGE DIAGNOSIS:  Principal Problem:   Acute respiratory failure with hypoxia Active Problems:   Overdose   Aspiration pneumonia   Autism   Bipolar 1 disorder   ARDS (adult respiratory distress syndrome)   Severe sepsis with acute organ dysfunction   SECONDARY DIAGNOSIS:   Past Medical History  Diagnosis Date  . Thyroid disease     HOSPITAL COURSE:   1. Acute respiratory failure with hypercapnia and hypoxia. Patient had a prolonged hospital course requiring intubation for overdose. Difficult weaning process and was extubated and then had a be reintubated. Patient was extubated for second time on 11/21/2014. He was weaned to room air and oxygenating well. Upon leaving the hospital he was on room air with good saturations. 2. Aspiration pneumonia both lungs with and MSSA. He was initially put on aggressive antibiotics and then switched over to Augmentin for completion of course. I did give him a prescription for 5 more days of Augmentin. 3. Acute encephalopathy and bipolar disorder- the patient was involuntarily committed during the entire hospital course. Even after extubation he required being placed on Precedex drip and Seroquel. When the patient was alert enough to ask me about his Lamictal I put him back on Lamictal and his Celexa. The patient had improved mental status wise. He denied any suicidal or homicidal ideation. He was seen by Dr. Guss Bunde psychiatry. She discontinued the IVC  and one-to-one sitter but recommended a reevaluation by psychiatry in the a.m. I spoke with Dr. Toni Amend psychiatry and he was unable to get to the patient for a few hours. The patient signed out AGAINST MEDICAL ADVICE since the involuntary commitment was discontinued. Patient does have a follow-up with psychiatry tomorrow. Hopefully he will get there. I did prescribe Seroquel. He has his Lamictal and Celexa at home. 4. Morbid obesity 5. Hypokalemia- replaced during the hospital course 6. Hypothyroidism unspecified- patient is on levothyroxine.  DISCHARGE CONDITIONS:   Satisfactory  CONSULTS OBTAINED:  Treatment Team:  Audery Amel, MD Clydie Braun, MD  DRUG ALLERGIES:   Allergies  Allergen Reactions  . Sulfa Antibiotics Other (See Comments)    Reaction:  Unknown     DISCHARGE MEDICATIONS:   Discharge Medication List as of 11/25/2014  9:41 AM    START taking these medications   Details  amoxicillin-clavulanate (AUGMENTIN) 875-125 MG per tablet Take 1 tablet by mouth every 12 (twelve) hours., Starting 11/25/2014, Until Discontinued, Print    QUEtiapine (SEROQUEL) 100 MG tablet Take one tab in am and two tabs in the PM, Print      CONTINUE these medications which have NOT CHANGED   Details  citalopram (CELEXA) 40 MG tablet Take 40 mg by mouth daily., Until Discontinued, Historical Med    esomeprazole (NEXIUM) 40 MG capsule Take 40 mg by mouth daily. , Until Discontinued, Historical Med    lamoTRIgine (LAMICTAL) 150 MG tablet Take 150 mg by mouth 2 (two) times daily., Until Discontinued, Historical Med    levothyroxine (SYNTHROID, LEVOTHROID) 75 MCG tablet Take 75  mcg by mouth daily before breakfast., Until Discontinued, Historical Med    rizatriptan (MAXALT) 10 MG tablet Take 10 mg by mouth as needed for migraine. May repeat in 2 hours if needed, Until Discontinued, Historical Med      STOP taking these medications     ALPRAZolam (XANAX) 1 MG tablet      ketorolac  (TORADOL) 10 MG tablet      naltrexone (DEPADE) 50 MG tablet      ondansetron (ZOFRAN) 4 MG tablet      oxyCODONE-acetaminophen (PERCOCET) 10-325 MG per tablet      promethazine (PHENERGAN) 25 MG suppository          DISCHARGE INSTRUCTIONS:   Follow-up with your psychiatrist tomorrow Follow-up with Dr. Maryellen Pile in 1 week  If you experience worsening of your admission symptoms, develop shortness of breath, life threatening emergency, suicidal or homicidal thoughts you must seek medical attention immediately by calling 911 or calling your MD immediately  if symptoms less severe.  You Must read complete instructions/literature along with all the possible adverse reactions/side effects for all the Medicines you take and that have been prescribed to you. Take any new Medicines after you have completely understood and accept all the possible adverse reactions/side effects.   Please note  You were cared for by a hospitalist during your hospital stay. If you have any questions about your discharge medications or the care you received while you were in the hospital after you are discharged, you can call the unit and asked to speak with the hospitalist on call if the hospitalist that took care of you is not available. Once you are discharged, your primary care physician will handle any further medical issues. Please note that NO REFILLS for any discharge medications will be authorized once you are discharged, as it is imperative that you return to your primary care physician (or establish a relationship with a primary care physician if you do not have one) for your aftercare needs so that they can reassess your need for medications and monitor your lab values.    Today   CHIEF COMPLAINT:   Chief Complaint  Patient presents with  . Drug Overdose    HISTORY OF PRESENT ILLNESS:  [name]  is a 24 y.o. male with a known history of bipolar disorder presented after drug overdose.   VITAL  SIGNS:  Blood pressure 146/89, pulse 106, temperature 98.6 F (37 C), temperature source Oral, resp. rate 14, height  (1.803 m), weight 119.4 kg (263 lb 3.7 oz), SpO2 100 %.  I/O:   Intake/Output Summary (Last 24 hours) at 11/25/14 1316 Last data filed at 11/25/14 0557  Gross per 24 hour  Intake    480 ml  Output      0 ml  Net    480 ml    DATA REVIEW:   CBC  Recent Labs Lab 11/22/14 0624  WBC 14.4*  HGB 13.3  HCT 39.1*  PLT 234    Chemistries   Recent Labs Lab 11/19/14 0444  11/21/14 0533 11/22/14 0624  NA 148*  < > 141 142  K 4.0  < > 4.2 3.7  CL 110  < > 98* 107  CO2 33*  < > 31 27  GLUCOSE 107*  < > 132* 107*  BUN 13  < > 17 20  CREATININE 0.73  < > 0.55* 0.57*  CALCIUM 8.4*  < > 8.6* 8.6*  MG 2.2  --   --   --  AST 50*  --  38  --   ALT 34  --  50  --   ALKPHOS 72  --  78  --   BILITOT 0.7  --  1.1  --   < > = values in this interval not displayed.  Cardiac Enzymes No results for input(s): TROPONINI in the last 168 hours.  Microbiology Results  Results for orders placed or performed during the hospital encounter of 11/12/14  MRSA PCR Screening     Status: None   Collection Time: 11/13/14  3:40 AM  Result Value Ref Range Status   MRSA by PCR NEGATIVE NEGATIVE Final    Comment:        The GeneXpert MRSA Assay (FDA approved for NASAL specimens only), is one component of a comprehensive MRSA colonization surveillance program. It is not intended to diagnose MRSA infection nor to guide or monitor treatment for MRSA infections.   Culture, bal-quantitative     Status: None   Collection Time: 11/15/14  9:53 AM  Result Value Ref Range Status   Specimen Description BRONCHIAL ALVEOLAR LAVAGE  Final   Special Requests NONE  Final   Gram Stain   Final    MANY WBC SEEN FEW GRAM POSITIVE COCCI RARE GRAM POSITIVE RODS EXCELLENT SPECIMEN - 90-100% WBCS    Culture MODERATE GROWTH STAPHYLOCOCCUS AUREUS  Final   Report Status 11/18/2014 FINAL   Final   Organism ID, Bacteria STAPHYLOCOCCUS AUREUS  Final      Susceptibility   Staphylococcus aureus - MIC*    CIPROFLOXACIN Value in next row Sensitive      SENSITIVE<=0.5    ERYTHROMYCIN Value in next row Sensitive      SENSITIVE<=0.25    GENTAMICIN Value in next row Sensitive      SENSITIVE<=0.5    OXACILLIN Value in next row Sensitive      SENSITIVE<=0.25    TETRACYCLINE Value in next row Sensitive      SENSITIVE<=1    CLINDAMYCIN Value in next row Sensitive      SENSITIVE<=0.25    CEFOXITIN SCREEN Value in next row Sensitive      SENSITIVE<=0.25    Inducible Clindamycin Value in next row Sensitive      SENSITIVE<=0.25    LINEZOLID Value in next row Sensitive      SENSITIVE2    TRIMETH/SULFA Value in next row Sensitive      SENSITIVE<=10    * MODERATE GROWTH STAPHYLOCOCCUS AUREUS  Culture, blood (routine x 2)     Status: None   Collection Time: 11/20/14  9:18 AM  Result Value Ref Range Status   Specimen Description BLOOD RIGHT ARM  Final   Special Requests BOTTLES DRAWN AEROBIC AND ANAEROBIC  7CC  Final   Culture NO GROWTH 5 DAYS  Final   Report Status 11/25/2014 FINAL  Final  Culture, blood (routine x 2)     Status: None   Collection Time: 11/20/14  9:20 AM  Result Value Ref Range Status   Specimen Description BLOOD LEFT ARM  Final   Special Requests   Final    BOTTLES DRAWN AEROBIC AND ANAEROBIC  6 CC AERO 5 CC ANAERO   Culture  Setup Time   Final    GRAM POSITIVE COCCI IN CLUSTERS CRITICAL RESULT CALLED TO, READ BACK BY AND VERIFIED WITH: JAMIE SMITH GREGORY AT 1325 11/21/14 DV ANAEROBIC BOTTLE ONLY    Culture   Final    COAGULASE NEGATIVE STAPHYLOCOCCUS ANAEROBIC BOTTLE ONLY Results  consistent with contamination.    Report Status 11/25/2014 FINAL  Final  Culture, respiratory (NON-Expectorated)     Status: None   Collection Time: 11/20/14 12:00 PM  Result Value Ref Range Status   Specimen Description TRACHEAL ASPIRATE  Final   Special Requests NONE  Final    Gram Stain   Final    FEW WBC SEEN RARE GRAM POSITIVE COCCI IN CLUSTERS GOOD SPECIMEN - 80-90% WBCS    Culture MODERATE GROWTH STAPHYLOCOCCUS AUREUS  Final   Report Status 11/23/2014 FINAL  Final   Organism ID, Bacteria STAPHYLOCOCCUS AUREUS  Final      Susceptibility   Staphylococcus aureus - MIC*    CIPROFLOXACIN <=0.5 SENSITIVE Sensitive     GENTAMICIN <=0.5 SENSITIVE Sensitive     OXACILLIN <=0.25 SENSITIVE Sensitive     TRIMETH/SULFA <=10 SENSITIVE Sensitive     CEFOXITIN SCREEN NEGATIVE Sensitive     Inducible Clindamycin NEGATIVE Sensitive     TETRACYCLINE Value in next row Sensitive      SENSITIVE<=1    * MODERATE GROWTH STAPHYLOCOCCUS AUREUS  Cath Tip Culture     Status: None   Collection Time: 11/20/14  4:32 PM  Result Value Ref Range Status   Specimen Description CATH TIP  Final   Special Requests NONE  Final   Culture NO GROWTH 2 DAYS  Final   Report Status 11/22/2014 FINAL  Final   CODE STATUS:     Code Status Orders        Start     Ordered   11/13/14 0345  Full code   Continuous     11/13/14 0344      TOTAL TIME TAKING CARE OF THIS PATIENT: 35 minutes.    Alford Highland M.D on 11/25/2014 at 1:16 PM  Between 7am to 6pm - Pager - [phone]  After 6pm go to [email or web address] - password [email or web address] [facility]  [facility]  Office  [phone]  CC: Primary care physician; Derwood Kaplan, MD

## 2014-11-25 NOTE — Progress Notes (Signed)
Encourage patient to stay to see Psychiatrist this afternoon  but patient left AMA.  Per patient he has a family emergency. Dr. Renae Gloss made aware.

## 2014-11-25 NOTE — Progress Notes (Signed)
Patient ID: [name], male   DOB: Feb 17, 1991, 24 y.o.   MRN: [id number] [facility] PROGRESS NOTE  PCP: Derwood Kaplan, MD  HPI/Subjective: Patient states that he has a family emergency and must get out here this morning. Patient physically feels fine and offers no complaints.  Objective: Filed Vitals:   11/25/14 0501  BP: 146/89  Pulse: 106  Temp: 98.6 F (37 C)  Resp: 14    Filed Weights   11/21/14 0636 11/21/14 2030 11/22/14 0500  Weight: 132.6 kg (292 lb 5.3 oz) 142.8 kg (314 lb 13.1 oz) 119.4 kg (263 lb 3.7 oz)    ROS: Review of Systems  Constitutional: Negative for fever and chills.  Eyes: Negative for blurred vision.  Respiratory: Negative for cough and shortness of breath.   Cardiovascular: Negative for chest pain.  Gastrointestinal: Positive for heartburn. Negative for nausea, vomiting, abdominal pain, diarrhea and constipation.  Genitourinary: Negative for dysuria.  Musculoskeletal: Negative for joint pain.  Neurological: Negative for dizziness and headaches.    Exam: Physical Exam  HENT:  Nose: No mucosal edema.  Eyes: Conjunctivae, EOM and lids are normal. Pupils are equal, round, and reactive to light.  Neck: No JVD present. Carotid bruit is not present. No edema present. No thyroid mass and no thyromegaly present.  Cardiovascular: S1 normal and S2 normal.  Exam reveals no gallop.   No murmur heard. Pulses:      Dorsalis pedis pulses are 2+ on the right side, and 2+ on the left side.  Respiratory: No respiratory distress. He has no wheezes. He has no rhonchi. He has no rales.  GI: Soft. Bowel sounds are normal. There is no tenderness.  Musculoskeletal:       Right ankle: He exhibits swelling.       Left ankle: He exhibits swelling.  Lymphadenopathy:    He has no cervical adenopathy.  Neurological: He is alert. No cranial nerve deficit.  As per nursing staff he was walking around without a problem last night.  Skin: Skin is warm.  Nails show no clubbing.  Some scabbing around the right neck area and chest  Psychiatric: He has a normal mood and affect.    Data Reviewed: Basic Metabolic Panel:  Recent Labs Lab 11/19/14 0444 11/20/14 0442 11/21/14 0533 11/22/14 0624  NA 148* 144 141 142  K 4.0 3.8 4.2 3.7  CL 110 102 98* 107  CO2 33* 33* 31 27  GLUCOSE 107* 118* 132* 107*  BUN CREATININE 0.73 0.71 0.55* 0.57*  CALCIUM 8.4* 8.2* 8.6* 8.6*  MG 2.2  --   --   --   PHOS 3.9  --   --   --    Liver Function Tests:  Recent Labs Lab 11/19/14 0444 11/21/14 0533  AST 50* 38  ALT 34 50  ALKPHOS 72 78  BILITOT 0.7 1.1  PROT 5.8* 7.0  ALBUMIN 2.5* 2.9*   CBC:  Recent Labs Lab 11/19/14 0444 11/20/14 0442 11/21/14 0533 11/22/14 0624  WBC 11.3* 14.7* 15.4* 14.4*  HGB 12.3* 12.7* 13.4 13.3  HCT 37.2* 38.7* 40.4 39.1*  MCV 90.5 89.6 89.2 88.0  PLT 196 217 228 234   CBG:  Recent Labs Lab 11/21/14 0047 11/21/14 0716 11/21/14 1615 11/22/14 0009 11/22/14 1618  GLUCAP 145* 105* 115* 101* 110*    Recent Results (from the past 240 hour(s))  Culture, bal-quantitative     Status: None   Collection Time: 11/15/14  9:53 AM  Result Value Ref Range Status   Specimen Description BRONCHIAL ALVEOLAR LAVAGE  Final   Special Requests NONE  Final   Gram Stain   Final    MANY WBC SEEN FEW GRAM POSITIVE COCCI RARE GRAM POSITIVE RODS EXCELLENT SPECIMEN - 90-100% WBCS    Culture MODERATE GROWTH STAPHYLOCOCCUS AUREUS  Final   Report Status 11/18/2014 FINAL  Final   Organism ID, Bacteria STAPHYLOCOCCUS AUREUS  Final      Susceptibility   Staphylococcus aureus - MIC*    CIPROFLOXACIN Value in next row Sensitive      SENSITIVE<=0.5    ERYTHROMYCIN Value in next row Sensitive      SENSITIVE<=0.25    GENTAMICIN Value in next row Sensitive      SENSITIVE<=0.5    OXACILLIN Value in next row Sensitive      SENSITIVE<=0.25    TETRACYCLINE Value in next row Sensitive      SENSITIVE<=1     CLINDAMYCIN Value in next row Sensitive      SENSITIVE<=0.25    CEFOXITIN SCREEN Value in next row Sensitive      SENSITIVE<=0.25    Inducible Clindamycin Value in next row Sensitive      SENSITIVE<=0.25    LINEZOLID Value in next row Sensitive      SENSITIVE2    TRIMETH/SULFA Value in next row Sensitive      SENSITIVE<=10    * MODERATE GROWTH STAPHYLOCOCCUS AUREUS  Culture, blood (routine x 2)     Status: None   Collection Time: 11/20/14  9:18 AM  Result Value Ref Range Status   Specimen Description BLOOD RIGHT ARM  Final   Special Requests BOTTLES DRAWN AEROBIC AND ANAEROBIC  7CC  Final   Culture NO GROWTH 5 DAYS  Final   Report Status 11/25/2014 FINAL  Final  Culture, blood (routine x 2)     Status: None   Collection Time: 11/20/14  9:20 AM  Result Value Ref Range Status   Specimen Description BLOOD LEFT ARM  Final   Special Requests   Final    BOTTLES DRAWN AEROBIC AND ANAEROBIC  6 CC AERO 5 CC ANAERO   Culture  Setup Time   Final    GRAM POSITIVE COCCI IN CLUSTERS CRITICAL RESULT CALLED TO, READ BACK BY AND VERIFIED WITH: JAMIE SMITH GREGORY AT 1325 11/21/14 DV ANAEROBIC BOTTLE ONLY    Culture   Final    COAGULASE NEGATIVE STAPHYLOCOCCUS ANAEROBIC BOTTLE ONLY Results consistent with contamination.    Report Status 11/25/2014 FINAL  Final  Culture, respiratory (NON-Expectorated)     Status: None   Collection Time: 11/20/14 12:00 PM  Result Value Ref Range Status   Specimen Description TRACHEAL ASPIRATE  Final   Special Requests NONE  Final   Gram Stain   Final    FEW WBC SEEN RARE GRAM POSITIVE COCCI IN CLUSTERS GOOD SPECIMEN - 80-90% WBCS    Culture MODERATE GROWTH STAPHYLOCOCCUS AUREUS  Final   Report Status 11/23/2014 FINAL  Final   Organism ID, Bacteria STAPHYLOCOCCUS AUREUS  Final      Susceptibility   Staphylococcus aureus - MIC*    CIPROFLOXACIN <=0.5 SENSITIVE Sensitive     GENTAMICIN <=0.5 SENSITIVE Sensitive     OXACILLIN <=0.25 SENSITIVE Sensitive      TRIMETH/SULFA <=10 SENSITIVE Sensitive     CEFOXITIN SCREEN NEGATIVE Sensitive     Inducible Clindamycin NEGATIVE Sensitive     TETRACYCLINE Value in next row Sensitive  SENSITIVE<=1    * MODERATE GROWTH STAPHYLOCOCCUS AUREUS  Cath Tip Culture     Status: None   Collection Time: 11/20/14  4:32 PM  Result Value Ref Range Status   Specimen Description CATH TIP  Final   Special Requests NONE  Final   Culture NO GROWTH 2 DAYS  Final   Report Status 11/22/2014 FINAL  Final     Studies: No results found.  Scheduled Meds: . amoxicillin-clavulanate  1 tablet Oral Q12H  . citalopram  40 mg Oral Daily  . enoxaparin (LOVENOX) injection  40 mg Subcutaneous Q24H  . famotidine  20 mg Oral BID  . guaiFENesin  600 mg Oral BID  . Influenza vac split quadrivalent PF  0.5 mL Intramuscular Tomorrow-1000  . lamoTRIgine  150 mg Oral BID  . levothyroxine  75 mcg Oral QAC breakfast  . QUEtiapine  100 mg Oral q morning - 10a  . QUEtiapine  200 mg Oral QHS   Continuous Infusions:    Assessment/Plan: 1. Acute respiratory failure- resolved. Now breathing on room air 2. Aspiration pneumonia- staph aureus growing out of the BAL. Augmentin for 5 more days.. 3. Acute encephalopathy- much improved. Patient on Seroquel. Restarted Lamictal and Celexa. 4. Morbid obesity 5. Hypothyroidism continue levothyroxine. 6. Bipolar disorder- seen by psychiatry last night and involuntary commitment papers were discontinued and one-to-one sitter was discontinued. 7. Tobacco abuse- Nicotrol inhaler ordered  Patient was seen last night by Dr. Guss Bunde covering psychiatrist. She DC'd the IVC commitment papers and the one-to-one sitter. She recommended psychiatry reevaluation in the a.m. I spoke with Dr. Toni Amend psychiatrist now back on the case. He is unable to get to the patient for at least 2 hours. The patient is unable to wait for Dr. Mat Carne packs reevaluation. The patient will sign out AGAINST MEDICAL ADVICE, since the  commitment papers are DC'd.   Code Status:     Code Status Orders        Start     Ordered   11/13/14 0345  Full code   Continuous     11/13/14 0344     Disposition Plan: Patient will sign out AGAINST MEDICAL ADVICE and go home.   Consultants:  Critical care specialist  Psychiatry  Time spent: 25 minutes  Alford Highland  [facility]

## 2014-11-25 NOTE — ED Notes (Signed)
Pt reports was d/c here today after being admitted for pneumonia. Pt reports he was in ICU and then on second floor, pt reports he was on the ventilator. Pt with hx of psych, attempted suicide before being placed on ventilator. Pt with largely dilated pupils, acting odd in triage room. Pt denies any ingestion of drugs, alcohol or medications besides those prescribed by MD. Pt reports vomiting pus today, reports continued cough, shortness of breath.

## 2014-11-26 ENCOUNTER — Encounter: Payer: Self-pay | Admitting: Internal Medicine

## 2014-11-26 ENCOUNTER — Emergency Department: Payer: Federal, State, Local not specified - PPO

## 2014-11-26 DIAGNOSIS — R0600 Dyspnea, unspecified: Secondary | ICD-10-CM | POA: Diagnosis present

## 2014-11-26 DIAGNOSIS — F329 Major depressive disorder, single episode, unspecified: Secondary | ICD-10-CM | POA: Diagnosis present

## 2014-11-26 DIAGNOSIS — Z882 Allergy status to sulfonamides status: Secondary | ICD-10-CM | POA: Diagnosis not present

## 2014-11-26 DIAGNOSIS — F17210 Nicotine dependence, cigarettes, uncomplicated: Secondary | ICD-10-CM | POA: Diagnosis present

## 2014-11-26 DIAGNOSIS — D72829 Elevated white blood cell count, unspecified: Secondary | ICD-10-CM

## 2014-11-26 DIAGNOSIS — Z79899 Other long term (current) drug therapy: Secondary | ICD-10-CM | POA: Diagnosis not present

## 2014-11-26 DIAGNOSIS — F319 Bipolar disorder, unspecified: Secondary | ICD-10-CM | POA: Diagnosis present

## 2014-11-26 DIAGNOSIS — E039 Hypothyroidism, unspecified: Secondary | ICD-10-CM | POA: Diagnosis present

## 2014-11-26 HISTORY — DX: Dyspnea, unspecified: R06.00

## 2014-11-26 HISTORY — DX: Elevated white blood cell count, unspecified: D72.829

## 2014-11-26 LAB — URINALYSIS COMPLETE WITH MICROSCOPIC (ARMC ONLY)
BILIRUBIN URINE: NEGATIVE
Bacteria, UA: NONE SEEN
Glucose, UA: NEGATIVE mg/dL
KETONES UR: NEGATIVE mg/dL
LEUKOCYTES UA: NEGATIVE
Nitrite: NEGATIVE
Protein, ur: NEGATIVE mg/dL
RBC / HPF: NONE SEEN RBC/hpf (ref 0–5)
SQUAMOUS EPITHELIAL / LPF: NONE SEEN
Specific Gravity, Urine: 1 — ABNORMAL LOW (ref 1.005–1.030)
pH: 6 (ref 5.0–8.0)

## 2014-11-26 LAB — COMPREHENSIVE METABOLIC PANEL
ALK PHOS: 92 U/L (ref 38–126)
ALT: 121 U/L — AB (ref 17–63)
AST: 53 U/L — ABNORMAL HIGH (ref 15–41)
Albumin: 3.9 g/dL (ref 3.5–5.0)
Anion gap: 6 (ref 5–15)
BILIRUBIN TOTAL: 1 mg/dL (ref 0.3–1.2)
BUN: 22 mg/dL — ABNORMAL HIGH (ref 6–20)
CALCIUM: 9.2 mg/dL (ref 8.9–10.3)
CO2: 24 mmol/L (ref 22–32)
CREATININE: 0.84 mg/dL (ref 0.61–1.24)
Chloride: 112 mmol/L — ABNORMAL HIGH (ref 101–111)
GFR calc non Af Amer: >60 mL/min (ref >60)
Glucose, Bld: 132 mg/dL — ABNORMAL HIGH (ref 65–99)
Potassium: 4.1 mmol/L (ref 3.5–5.1)
SODIUM: 142 mmol/L (ref 135–145)
Total Protein: 8 g/dL (ref 6.5–8.1)

## 2014-11-26 LAB — CBC WITH DIFFERENTIAL/PLATELET
BASOS PCT: 1 %
Basophils Absolute: 0.2 10*3/uL — ABNORMAL HIGH (ref 0–0.1)
EOS PCT: 1 %
Eosinophils Absolute: 0.2 10*3/uL (ref 0–0.7)
HEMATOCRIT: 49.3 % (ref 40.0–52.0)
HEMOGLOBIN: 16.3 g/dL (ref 13.0–18.0)
LYMPHS PCT: 11 %
Lymphs Abs: 2.5 10*3/uL (ref 1.0–3.6)
MCH: 29 pg (ref 26.0–34.0)
MCHC: 33 g/dL (ref 32.0–36.0)
MCV: 87.9 fL (ref 80.0–100.0)
MONOS PCT: 3 %
Monocytes Absolute: 0.7 10*3/uL (ref 0.2–1.0)
NEUTROS PCT: 84 %
Neutro Abs: 18.9 10*3/uL — ABNORMAL HIGH (ref 1.4–6.5)
Platelets: 351 10*3/uL (ref 150–440)
RBC: 5.6 MIL/uL (ref 4.40–5.90)
RDW: 14.1 % (ref 11.5–14.5)
WBC: 22.5 10*3/uL — ABNORMAL HIGH (ref 3.8–10.6)

## 2014-11-26 LAB — URINE DRUG SCREEN, QUALITATIVE (ARMC ONLY)
AMPHETAMINES, UR SCREEN: NOT DETECTED
Barbiturates, Ur Screen: NOT DETECTED
Benzodiazepine, Ur Scrn: NOT DETECTED
COCAINE METABOLITE, UR ~~LOC~~: NOT DETECTED
Cannabinoid 50 Ng, Ur ~~LOC~~: NOT DETECTED
MDMA (ECSTASY) UR SCREEN: NOT DETECTED
Methadone Scn, Ur: NOT DETECTED
OPIATE, UR SCREEN: NOT DETECTED
PHENCYCLIDINE (PCP) UR S: NOT DETECTED
Tricyclic, Ur Screen: NOT DETECTED

## 2014-11-26 LAB — LACTIC ACID, PLASMA
LACTIC ACID, VENOUS: 0.9 mmol/L (ref 0.5–2.0)
Lactic Acid, Venous: 0.9 mmol/L (ref 0.5–2.0)

## 2014-11-26 LAB — TROPONIN I: Troponin I: <0.03 ng/mL (ref <0.031)

## 2014-11-26 MED ORDER — PIPERACILLIN-TAZOBACTAM 4.5 G IVPB
4.5000 g | Freq: Three times a day (TID) | INTRAVENOUS | Status: DC
  Filled 2014-11-26 (×4): qty 100

## 2014-11-26 MED ORDER — ONDANSETRON HCL 4 MG PO TABS: 4.0000 mg | ORAL_TABLET | Freq: Four times a day (QID) | ORAL | Status: DC | PRN

## 2014-11-26 MED ORDER — ALBUTEROL SULFATE (2.5 MG/3ML) 0.083% IN NEBU: 2.5000 mg | INHALATION_SOLUTION | RESPIRATORY_TRACT | Status: DC | PRN

## 2014-11-26 MED ORDER — CITALOPRAM HYDROBROMIDE 20 MG PO TABS: 40.0000 mg | ORAL_TABLET | Freq: Every day | ORAL | Status: DC

## 2014-11-26 MED ORDER — QUETIAPINE FUMARATE 200 MG PO TABS
ORAL_TABLET | ORAL | Status: AC
  Administered 2014-11-26: 100 mg via ORAL
  Filled 2014-11-26: qty 1

## 2014-11-26 MED ORDER — PANTOPRAZOLE SODIUM 40 MG PO TBEC: 40.0000 mg | DELAYED_RELEASE_TABLET | Freq: Every day | ORAL | Status: DC

## 2014-11-26 MED ORDER — ACETAMINOPHEN 325 MG PO TABS: 650.0000 mg | ORAL_TABLET | Freq: Four times a day (QID) | ORAL | Status: DC | PRN

## 2014-11-26 MED ORDER — QUETIAPINE FUMARATE 100 MG PO TABS
100.0000 mg | ORAL_TABLET | Freq: Two times a day (BID) | ORAL | Status: DC
  Administered 2014-11-26: 100 mg via ORAL

## 2014-11-26 MED ORDER — VANCOMYCIN HCL IN DEXTROSE 1-5 GM/200ML-% IV SOLN
1000.0000 mg | Freq: Once | INTRAVENOUS | Status: AC
  Administered 2014-11-26: 1000 mg via INTRAVENOUS
  Filled 2014-11-26: qty 200

## 2014-11-26 MED ORDER — VANCOMYCIN HCL 10 G IV SOLR
1500.0000 mg | Freq: Three times a day (TID) | INTRAVENOUS | Status: DC
  Filled 2014-11-26 (×4): qty 1500

## 2014-11-26 MED ORDER — LEVOTHYROXINE SODIUM 75 MCG PO TABS: 75.0000 ug | ORAL_TABLET | Freq: Every day | ORAL | Status: DC

## 2014-11-26 MED ORDER — ONDANSETRON HCL 4 MG/2ML IJ SOLN: 4.0000 mg | Freq: Four times a day (QID) | INTRAMUSCULAR | Status: DC | PRN

## 2014-11-26 MED ORDER — PIPERACILLIN-TAZOBACTAM 3.375 G IVPB
3.3750 g | Freq: Once | INTRAVENOUS | Status: AC
  Administered 2014-11-26: 3.375 g via INTRAVENOUS
  Filled 2014-11-26: qty 50

## 2014-11-26 MED ORDER — ENOXAPARIN SODIUM 40 MG/0.4ML ~~LOC~~ SOLN: 40.0000 mg | Freq: Two times a day (BID) | SUBCUTANEOUS | Status: DC

## 2014-11-26 MED ORDER — ACETAMINOPHEN 650 MG RE SUPP: 650.0000 mg | Freq: Four times a day (QID) | RECTAL | Status: DC | PRN

## 2014-11-26 MED ORDER — VANCOMYCIN HCL 500 MG IV SOLR
500.0000 mg | Freq: Once | INTRAVENOUS | Status: AC
  Administered 2014-11-26: 500 mg via INTRAVENOUS
  Filled 2014-11-26: qty 500

## 2014-11-26 MED ORDER — LAMOTRIGINE 100 MG PO TABS: 150.0000 mg | ORAL_TABLET | Freq: Two times a day (BID) | ORAL | Status: DC

## 2014-11-26 MED ORDER — ONDANSETRON HCL 4 MG/2ML IJ SOLN
4.0000 mg | Freq: Once | INTRAMUSCULAR | Status: AC
  Administered 2014-11-26: 4 mg via INTRAVENOUS
  Filled 2014-11-26: qty 2

## 2014-11-26 NOTE — H&P (Signed)
[facility] - [place] at [facility]   PATIENT NAME: [name]    MR#:  [id number]  DATE OF BIRTH:  05-06-1990  DATE OF ADMISSION:  11/25/2014  PRIMARY CARE PHYSICIAN: Derwood Kaplan, MD   REQUESTING/REFERRING PHYSICIAN: Dr. Zenda Alpers  CHIEF COMPLAINT:   Chief Complaint  Patient presents with  . Shortness of Breath   cough Nausea and vomiting  HISTORY OF PRESENT ILLNESS:  [name]  is a 24 y.o. male with a known history of bipolar disorder, hypothyroidism recent admission to [facility] 2 weeks ago with heroin overdose requiring intubation and prolonged hospital stay complicated by aspiration pneumonia, signed AMA and left hospital yesterday on oral antibiotics returns to the emergency room today with the complaints of shortness of breath and cough following episode of emesis. Does also complain of low-grade fever. Denies any chest pain. Denies usage of any recreational drug use since his going home yesterday. Evaluation the ED revealed low-grade temperature of 77F and elevated WBC of 22.5. Rest of his labs were unremarkable. Chest x-ray mild bronchitic changes with minimal basilar atelectasis. EKG sinus tachycardia with ventricular rate of 1 13 bpm. Blood cultures done during his prior admission positive for MSSA. Patient was started on IV antibiotics- vancomycin and Zosyn and hospitalist service was consulted for further management.  PAST MEDICAL HISTORY:   Past Medical History  Diagnosis Date  . Thyroid disease   . Depression   . Suicide attempt   . Bipolar 1 disorder   . PTSD (post-traumatic stress disorder)   . Hypothyroidism     PAST SURGICAL HISTORY:   Past Surgical History  Procedure Laterality Date  . Appendectomy      SOCIAL HISTORY:   Social History  Substance Use Topics  . Smoking status: Current Some Day Smoker -- 1.00 packs/day    Types: Cigarettes  . Smokeless tobacco: Not on file  . Alcohol Use: No    FAMILY HISTORY:    Family History  Problem Relation Age of Onset  . Family history unknown: Yes    DRUG ALLERGIES:   Allergies  Allergen Reactions  . Sulfa Antibiotics Other (See Comments)    Reaction:  Unknown     REVIEW OF SYSTEMS:   Review of Systems  Constitutional: Positive for fever and malaise/fatigue. Negative for chills.  HENT: Negative for ear pain, hearing loss, nosebleeds, sore throat and tinnitus.   Eyes: Negative for blurred vision, double vision, pain, discharge and redness.  Respiratory: Positive for cough and shortness of breath. Negative for hemoptysis, sputum production and wheezing.   Cardiovascular: Negative for chest pain, palpitations, orthopnea and leg swelling.  Gastrointestinal: Positive for nausea and vomiting. Negative for abdominal pain, diarrhea, constipation, blood in stool and melena.  Genitourinary: Negative for dysuria, urgency, frequency and hematuria.  Musculoskeletal: Negative for back pain, joint pain and neck pain.  Skin: Negative for itching and rash.  Neurological: Negative for dizziness, tingling, sensory change, focal weakness and seizures.  Endo/Heme/Allergies: Does not bruise/bleed easily.  Psychiatric/Behavioral: Positive for depression and substance abuse. The patient is not nervous/anxious.     MEDICATIONS AT HOME:   Prior to Admission medications   Medication Sig Start Date End Date Taking? Authorizing Provider  amoxicillin-clavulanate (AUGMENTIN) 875-125 MG per tablet Take 1 tablet by mouth every 12 (twelve) hours. 11/25/14   Alford Highland, MD  citalopram (CELEXA) 40 MG tablet Take 40 mg by mouth daily.    Historical Provider, MD  esomeprazole (NEXIUM) 40 MG capsule Take  40 mg by mouth daily.     Historical Provider, MD  lamoTRIgine (LAMICTAL) 150 MG tablet Take 150 mg by mouth 2 (two) times daily.    Historical Provider, MD  levothyroxine (SYNTHROID, LEVOTHROID) 75 MCG tablet Take 75 mcg by mouth daily before breakfast.    Historical  Provider, MD  QUEtiapine (SEROQUEL) 100 MG tablet Take one tab in am and two tabs in the PM 11/25/14   Alford Highland, MD  rizatriptan (MAXALT) 10 MG tablet Take 10 mg by mouth as needed for migraine. May repeat in 2 hours if needed    Historical Provider, MD      VITAL SIGNS:  Blood pressure 140/80, pulse 98, temperature 99.1 F (37.3 C), temperature source Oral, resp. rate 22, height  (1.702 m), weight 167.831 kg (370 lb), SpO2 94 %.  PHYSICAL EXAMINATION:  Physical Exam  Constitutional: He is oriented to person, place, and time. He appears well-developed and well-nourished. No distress.  HENT:  Head: Normocephalic and atraumatic.  Right Ear: External ear normal.  Left Ear: External ear normal.  Nose: Nose normal.  Mouth/Throat: Oropharynx is clear and moist. No oropharyngeal exudate.  Eyes: EOM are normal. Pupils are equal, round, and reactive to light. No scleral icterus.  Neck: Normal range of motion. Neck supple. No JVD present. No thyromegaly present.  Cardiovascular: Normal rate, regular rhythm, normal heart sounds and intact distal pulses.  Exam reveals no friction rub.   No murmur heard. Respiratory: Effort normal and breath sounds normal. No respiratory distress. He has no wheezes. He has no rales. He exhibits no tenderness.  GI: Soft. Bowel sounds are normal. He exhibits no distension and no mass. There is no tenderness. There is no rebound and no guarding.  Musculoskeletal: Normal range of motion. He exhibits no edema.  Lymphadenopathy:    He has no cervical adenopathy.  Neurological: He is alert and oriented to person, place, and time. He has normal reflexes. He displays normal reflexes. No cranial nerve deficit. He exhibits normal muscle tone.  Skin: Skin is warm. No rash noted. No erythema.  Psychiatric: He has a normal mood and affect. His behavior is normal. Thought content normal.   LABORATORY PANEL:   CBC  Recent Labs Lab 11/26/14 0005  WBC 22.5*  HGB  16.3  HCT 49.3  PLT 351   ------------------------------------------------------------------------------------------------------------------  Chemistries   Recent Labs Lab 11/19/14 0444  11/26/14 0005  NA 148*  < > 142  K 4.0  < > 4.1  CL 110  < > 112*  CO2 33*  < > 24  GLUCOSE 107*  < > 132*  BUN 13  < > 22*  CREATININE 0.73  < > 0.84  CALCIUM 8.4*  < > 9.2  MG 2.2  --   --   AST 50*  < > 53*  ALT 34  < > 121*  ALKPHOS 72  < > 92  BILITOT 0.7  < > 1.0  < > = values in this interval not displayed. ------------------------------------------------------------------------------------------------------------------  Cardiac Enzymes  Recent Labs Lab 11/26/14 0005  TROPONINI <0.03   ------------------------------------------------------------------------------------------------------------------  RADIOLOGY:  Dg Chest 2 View  11/26/2014   CLINICAL DATA:  Recent discharge following pneumonia and being placed on ventilator, dilated pupils, vomiting, cough, shortness of breath, smoker  EXAM: CHEST  2 VIEW  COMPARISON:  11/21/2014  FINDINGS: Normal heart size, mediastinal contours, and pulmonary vascularity.  Mild peribronchial thickening.  Minimal basilar atelectasis anteriorly.  No pulmonary infiltrate, pleural  effusion, or pneumothorax.  Bones unremarkable.  IMPRESSION: Mild bronchitic changes with minimal basilar atelectasis.   Electronically Signed   By: Ulyses Southward M.D.   On: 11/26/2014 01:07    EKG:   Orders placed or performed during the hospital encounter of 11/25/14  . EKG 12-Lead  . EKG 12-Lead  Sinus tachycardia with ventricular rate of 1 13 bpm.  IMPRESSION AND PLAN:   24 year old male with history of bipolar disorder, hypothyroidism, recent [facility] admission 2 weeks ago with heroine overdose with complicated hospital course including intubation 2 and aspiration pneumonia, who signed AMA and left hospital yesterday returns to the emergency room with the complaints of  shortness of breath following episode of nausea and vomiting, found to have elevated WBC of 22.5 with low-grade temperature. 1. Dyspnea following episode of emesis. Likely secondary to aspiration. 2. Leukocytosis. 3. Aspiration pneumonia developed during his recent hospital stay, related by intubation 2 following heroine overdose. Blood cultures positive for MSSA. Plan: Admit, continue IV antibiotics-vancomycin and Zosyn, albuterol nebs as needed, follow-up CBC, O2 sats. 4. Bipolar disorder/depression, stable. Continue home medications. 5. Hypothyroidism, stable on home medications. Continue same. 6. History of recent heroine use with overdose. Patient denies using any recreational drugs since going out of hospital. Obtain urine toxicology and follow-up accordingly.  DVT prophylaxis-subcutaneous Lovenox  All the records are reviewed and case discussed with ED provider. Management plans discussed with the patient  and in agreement.  CODE STATUS: Full code  TOTAL TIME TAKING CARE OF THIS PATIENT: 50 minutes.    Jonnie Kind N M.D on 11/26/2014 at 3:12 AM  Between 7am to 6pm - Pager - [phone]  After 6pm go to [email or web address] - password [email or web address] [facility]  [facility]  Office  [phone]  CC: Primary care physician; Derwood Kaplan, MD

## 2014-11-26 NOTE — ED Notes (Signed)
New orders received, will complete accordingly.

## 2014-11-26 NOTE — ED Notes (Signed)
Pt noted sitting cross-legged on mattress, per pt position of comfort. Will continue to monitor.

## 2014-11-26 NOTE — ED Notes (Signed)
Pt noted laying on the floor with mattress, per pt position of comfort.

## 2014-11-26 NOTE — ED Notes (Signed)
Pt states he feels nauseous and has "stomach pain. Pt states, "Can I get something for this stomach pain?" Will make admitting MD aware.

## 2014-11-26 NOTE — Progress Notes (Signed)
Dr. Renae Gloss notified of patient's request of wanting to leave. MD stated if patient wants to leave, he can leave AMA. Patient signed AMA document. Security notified of returning patient's wallet.

## 2014-11-26 NOTE — Progress Notes (Signed)
Patient wants to leave AMA, patient states he has a psych appointment that he must go to. Patient teaching giving to patient. Dr. Betti Cruz notified stated to let patient know that rounding doctors would be in this morning.

## 2014-11-26 NOTE — Care Management (Signed)
RNCM consult received. Patient left AMA before i was able to assess. It doesn't appear that patient is homebound.

## 2014-11-26 NOTE — Progress Notes (Signed)
ANTIBIOTIC CONSULT NOTE - INITIAL  Pharmacy Consult for vancomycin and Zosyn dosing Indication: aspiration pneumonia  Allergies  Allergen Reactions  . Sulfa Antibiotics Other (See Comments)    Reaction:  Unknown     Patient Measurements: Height:  (170.2 cm) Weight: (!) 370 lb (167.831 kg) IBW/kg (Calculated) : 66.1 Adjusted Body Weight: 107kg  Vital Signs: Temp: 98 F (36.7 C) (09/20 0417) Temp Source: Oral (09/20 0417) BP: 154/77 mmHg (09/20 0417) Pulse Rate: 92 (09/20 0417) Intake/Output from previous day: 09/19 0701 - 09/20 0700 In: 200 [IV Piggyback:200] Out: -  Intake/Output from this shift: Total I/O In: 200 [IV Piggyback:200] Out: -   Labs:  Recent Labs  11/26/14 0005  WBC 22.5*  HGB 16.3  PLT 351  CREATININE 0.84   Estimated Creatinine Clearance: 204.8 mL/min (by C-G formula based on Cr of 0.84). No results for input(s): VANCOTROUGH, VANCOPEAK, VANCORANDOM, GENTTROUGH, GENTPEAK, GENTRANDOM, TOBRATROUGH, TOBRAPEAK, TOBRARND, AMIKACINPEAK, AMIKACINTROU, AMIKACIN in the last 72 hours.   Microbiology: Recent Results (from the past 720 hour(s))  MRSA PCR Screening     Status: None   Collection Time: 11/13/14  3:40 AM  Result Value Ref Range Status   MRSA by PCR NEGATIVE NEGATIVE Final    Comment:        The GeneXpert MRSA Assay (FDA approved for NASAL specimens only), is one component of a comprehensive MRSA colonization surveillance program. It is not intended to diagnose MRSA infection nor to guide or monitor treatment for MRSA infections.   Culture, bal-quantitative     Status: None   Collection Time: 11/15/14  9:53 AM  Result Value Ref Range Status   Specimen Description BRONCHIAL ALVEOLAR LAVAGE  Final   Special Requests NONE  Final   Gram Stain   Final    MANY WBC SEEN FEW GRAM POSITIVE COCCI RARE GRAM POSITIVE RODS EXCELLENT SPECIMEN - 90-100% WBCS    Culture MODERATE GROWTH STAPHYLOCOCCUS AUREUS  Final   Report Status  11/18/2014 FINAL  Final   Organism ID, Bacteria STAPHYLOCOCCUS AUREUS  Final      Susceptibility   Staphylococcus aureus - MIC*    CIPROFLOXACIN Value in next row Sensitive      SENSITIVE<=0.5    ERYTHROMYCIN Value in next row Sensitive      SENSITIVE<=0.25    GENTAMICIN Value in next row Sensitive      SENSITIVE<=0.5    OXACILLIN Value in next row Sensitive      SENSITIVE<=0.25    TETRACYCLINE Value in next row Sensitive      SENSITIVE<=1    CLINDAMYCIN Value in next row Sensitive      SENSITIVE<=0.25    CEFOXITIN SCREEN Value in next row Sensitive      SENSITIVE<=0.25    Inducible Clindamycin Value in next row Sensitive      SENSITIVE<=0.25    LINEZOLID Value in next row Sensitive      SENSITIVE2    TRIMETH/SULFA Value in next row Sensitive      SENSITIVE<=10    * MODERATE GROWTH STAPHYLOCOCCUS AUREUS  Culture, blood (routine x 2)     Status: None   Collection Time: 11/20/14  9:18 AM  Result Value Ref Range Status   Specimen Description BLOOD RIGHT ARM  Final   Special Requests BOTTLES DRAWN AEROBIC AND ANAEROBIC  7CC  Final   Culture NO GROWTH 5 DAYS  Final   Report Status 11/25/2014 FINAL  Final  Culture, blood (routine x 2)  Status: None   Collection Time: 11/20/14  9:20 AM  Result Value Ref Range Status   Specimen Description BLOOD LEFT ARM  Final   Special Requests   Final    BOTTLES DRAWN AEROBIC AND ANAEROBIC  6 CC AERO 5 CC ANAERO   Culture  Setup Time   Final    GRAM POSITIVE COCCI IN CLUSTERS CRITICAL RESULT CALLED TO, READ BACK BY AND VERIFIED WITH: JAMIE SMITH GREGORY AT 1325 11/21/14 DV ANAEROBIC BOTTLE ONLY    Culture   Final    COAGULASE NEGATIVE STAPHYLOCOCCUS ANAEROBIC BOTTLE ONLY Results consistent with contamination.    Report Status 11/25/2014 FINAL  Final  Culture, respiratory (NON-Expectorated)     Status: None   Collection Time: 11/20/14 12:00 PM  Result Value Ref Range Status   Specimen Description TRACHEAL ASPIRATE  Final   Special  Requests NONE  Final   Gram Stain   Final    FEW WBC SEEN RARE GRAM POSITIVE COCCI IN CLUSTERS GOOD SPECIMEN - 80-90% WBCS    Culture MODERATE GROWTH STAPHYLOCOCCUS AUREUS  Final   Report Status 11/23/2014 FINAL  Final   Organism ID, Bacteria STAPHYLOCOCCUS AUREUS  Final      Susceptibility   Staphylococcus aureus - MIC*    CIPROFLOXACIN <=0.5 SENSITIVE Sensitive     GENTAMICIN <=0.5 SENSITIVE Sensitive     OXACILLIN <=0.25 SENSITIVE Sensitive     TRIMETH/SULFA <=10 SENSITIVE Sensitive     CEFOXITIN SCREEN NEGATIVE Sensitive     Inducible Clindamycin NEGATIVE Sensitive     TETRACYCLINE Value in next row Sensitive      SENSITIVE<=1    * MODERATE GROWTH STAPHYLOCOCCUS AUREUS  Cath Tip Culture     Status: None   Collection Time: 11/20/14  4:32 PM  Result Value Ref Range Status   Specimen Description CATH TIP  Final   Special Requests NONE  Final   Culture NO GROWTH 2 DAYS  Final   Report Status 11/22/2014 FINAL  Final    Medical History: Past Medical History  Diagnosis Date  . Thyroid disease   . Depression   . Suicide attempt   . Bipolar 1 disorder   . PTSD (post-traumatic stress disorder)   . Hypothyroidism     Medications:   Assessment: Blood cx pending UA: (-)  CXR: bronchitis; atlectasis   Goal of Therapy:  Vancomycin trough level 15-20 mcg/ml  Plan:  TBW 167.8kg  IW 66.1kg  DW 107kg  Vd 75L kei 0.1 hr-1  t1/2 7 hours Vancomycin 1500 mg q 8 hours ordered with stacked dosing. Level before 4th dose to monitor for possible accumulation.  Zosyn 4.5 grams q 8 hours ordered for TBW >120kg.  McBane,Matthew S 11/26/2014,5:06 AM

## 2014-11-26 NOTE — Progress Notes (Signed)
Patient arrived to the unit from ED.Patient alert and oriented. Shaking and dilated pupils noted. Patient cooperative.Patient denies harming self or others. Past medical history expressed to nursing supervisor Leona Singleton  and per supervisor sitter not needed at this time.Call bell within reach.

## 2014-11-26 NOTE — ED Provider Notes (Signed)
[facility] Emergency Department Provider Note  ____________________________________________  Time seen: Approximately 2335 pm  I have reviewed the triage vital signs and the nursing notes.   HISTORY  Chief Complaint Shortness of Breath    HPI [name] is a 24 y.o. male who was admitted to the hospital on September 6 with a heroin overdose. The patient reports that he had a breathing tube in place but he had vomited and aspirated into his lungs. The patient reports that he thinks he has pneumonia and fluid in his lungs. He reports that he left the hospital today AGAINST MEDICAL ADVICE because he was impatient and did not like that he was in the hospital for the last 2 weeks. The patient reports that he "started feeling shortness of breath and having a cough. He reports that he can feel the liquid in his lungs. The patient reports being on antibiotics and having fevers as well. He's had some abdominal pain and vomiting he reports as well. The patient reports that the doctor did not advise him leaving the hospital today but he left anyway.   Past Medical History  Diagnosis Date  . Thyroid disease   . Depression   . Suicide attempt   . Bipolar 1 disorder   . PTSD (post-traumatic stress disorder)   . Hypothyroidism     Patient Active Problem List   Diagnosis Date Noted  . Autism 11/16/2014  . Bipolar 1 disorder 11/16/2014  . ARDS (adult respiratory distress syndrome) 11/16/2014  . Severe sepsis with acute organ dysfunction 11/16/2014  . Aspiration pneumonia 11/15/2014  . Overdose   . Acute respiratory failure with hypoxia 11/13/2014    Past Surgical History  Procedure Laterality Date  . Appendectomy      Current Outpatient Rx  Name  Route  Sig  Dispense  Refill  . amoxicillin-clavulanate (AUGMENTIN) 875-125 MG per tablet   Oral   Take 1 tablet by mouth every 12 (twelve) hours.   10 tablet   0   . citalopram (CELEXA) 40 MG tablet   Oral   Take 40 mg by mouth daily.         Marland Kitchen esomeprazole (NEXIUM) 40 MG capsule   Oral   Take 40 mg by mouth daily.          Marland Kitchen lamoTRIgine (LAMICTAL) 150 MG tablet   Oral   Take 150 mg by mouth 2 (two) times daily.         Marland Kitchen levothyroxine (SYNTHROID, LEVOTHROID) 75 MCG tablet   Oral   Take 75 mcg by mouth daily before breakfast.         . QUEtiapine (SEROQUEL) 100 MG tablet      Take one tab in am and two tabs in the PM   90 tablet   0   . rizatriptan (MAXALT) 10 MG tablet   Oral   Take 10 mg by mouth as needed for migraine. May repeat in 2 hours if needed           Allergies Sulfa antibiotics  No family history on file.  Social History Social History  Substance Use Topics  . Smoking status: Current Some Day Smoker -- 1.00 packs/day    Types: Cigarettes  . Smokeless tobacco: None  . Alcohol Use: No    Review of Systems Constitutional: Fevers Eyes: No visual changes. ENT: No sore throat. Cardiovascular: Denies chest pain. Respiratory: Shortness of breath Gastrointestinal: Abdominal pain, nausea, vomiting Genitourinary: Negative for dysuria.  Musculoskeletal: Negative for back pain. Skin: Negative for rash. Neurological: Negative for headaches, focal weakness or numbness.  10-point ROS otherwise negative.  ____________________________________________   PHYSICAL EXAM:  VITAL SIGNS: ED Triage Vitals  Enc Vitals Group     BP 11/25/14 2235 151/66 mmHg     Pulse Rate 11/25/14 2235 121     Resp 11/26/14 0002 26     Temp 11/25/14 2235 99.3 F (37.4 C)     Temp Source 11/25/14 2235 Oral     SpO2 11/25/14 2235 92 %     Weight 11/25/14 2235 370 lb (167.831 kg)     Height 11/25/14 2235  (1.702 m)     Head Cir --      Peak Flow --      Pain Score 11/25/14 2248 10     Pain Loc --      Pain Edu? --      Excl. in GC? --     Constitutional: Alert and oriented. Disheveled appearing and in moderate distress. Eyes: Conjunctivae are normal. PERRL.  EOMI. Head: Atraumatic. Nose: No congestion/rhinnorhea. Mouth/Throat: Mucous membranes are moist.  Oropharynx non-erythematous. Cardiovascular: Tachycardia, regular rhythm. Grossly normal heart sounds.  Good peripheral circulation. Respiratory: Normal respiratory effort.  No retractions. Lungs CTAB. Gastrointestinal: Soft and nontender. No distention. Positive bowel sounds Musculoskeletal: No lower extremity tenderness nor edema.   Neurologic:  Normal speech and language. No gross focal neurologic deficits are appreciated. No gait instability. Skin:  Skin is warm, dry and intact.  Psychiatric: Mood and affect are normal.   ____________________________________________   LABS (all labs ordered are listed, but only abnormal results are displayed)  Labs Reviewed  CBC WITH DIFFERENTIAL/PLATELET - Abnormal; Notable for the following:    WBC 22.5 (*)    Neutro Abs 18.9 (*)    Basophils Absolute 0.2 (*)    All other components within normal limits  COMPREHENSIVE METABOLIC PANEL - Abnormal; Notable for the following:    Chloride 112 (*)    Glucose, Bld 132 (*)    BUN 22 (*)    AST 53 (*)    ALT 121 (*)    All other components within normal limits  URINALYSIS COMPLETEWITH MICROSCOPIC ([facility] ONLY) - Abnormal; Notable for the following:    Color, Urine STRAW (*)    APPearance CLEAR (*)    Specific Gravity, Urine 1.000 (*)    Hgb urine dipstick 1+ (*)    All other components within normal limits  CULTURE, BLOOD (ROUTINE X 2)  CULTURE, BLOOD (ROUTINE X 2)  TROPONIN I  LACTIC ACID, PLASMA   ____________________________________________  EKG  ED ECG REPORT I, Rebecka Apley, the attending physician, personally viewed and interpreted this ECG.   Date: 11/25/2014  EKG Time: 2329  Rate: 113  Rhythm: sinus tachycardia  Axis: Normal  Intervals:none  ST&T Change: None  ____________________________________________  RADIOLOGY  Chest x-ray: Mild bronchitic changes with minimal  basilar atelectasis. ____________________________________________   PROCEDURES  Procedure(s) performed: None  Critical Care performed: No  ____________________________________________   INITIAL IMPRESSION / ASSESSMENT AND PLAN / ED COURSE  Pertinent labs & imaging results that were available during my care of the patient were reviewed by me and considered in my medical decision making (see chart for details).  This is a 24 year old male who was admitted to the hospital for a drug overdose. The patient had been intubated and reports that he has aspirated. The patient is coming in feeling increased shortness of  breath tachycardia and fever. The patient feels as though he may have pneumonia. I will evaluate the patient give him a liter of normal saline IV and reassess the patient once I received his blood work and x-ray imaging.  The patient's x-ray does not show a significant pneumonia but he does have a significantly elevated white blood cell count. The patient was also tachycardic on arrival. Looking over the patient's culture results and his bronchial aspirate results it appears as though the patient had grown some staph aureus from his lungs. I will give the patient a dose of Zosyn as well as vancomycin and readmit him to the hospital given his tachycardia and dyspnea. The patient will be admitted to complete his antibiotic course for his aspiration pneumonia. ____________________________________________   FINAL CLINICAL IMPRESSION(S) / ED DIAGNOSES  Final diagnoses:  Aspiration pneumonia, unspecified aspiration pneumonia type  Dyspnea      Rebecka Apley, MD 11/26/14 [phone]

## 2014-11-27 ENCOUNTER — Observation Stay
Admission: EM | Admit: 2014-11-27 | Discharge: 2014-11-29 | Disposition: A | Discharge status: Home / Self Care | Payer: Federal, State, Local not specified - PPO | Attending: Internal Medicine | Admitting: Internal Medicine

## 2014-11-27 ENCOUNTER — Encounter: Payer: Self-pay | Admitting: Emergency Medicine

## 2014-11-27 DIAGNOSIS — Z915 Personal history of self-harm: Secondary | ICD-10-CM | POA: Insufficient documentation

## 2014-11-27 DIAGNOSIS — E039 Hypothyroidism, unspecified: Secondary | ICD-10-CM | POA: Diagnosis not present

## 2014-11-27 DIAGNOSIS — F4310 Post-traumatic stress disorder, unspecified: Secondary | ICD-10-CM | POA: Diagnosis not present

## 2014-11-27 DIAGNOSIS — R1110 Vomiting, unspecified: Secondary | ICD-10-CM

## 2014-11-27 DIAGNOSIS — F319 Bipolar disorder, unspecified: Secondary | ICD-10-CM | POA: Diagnosis not present

## 2014-11-27 DIAGNOSIS — R1013 Epigastric pain: Secondary | ICD-10-CM | POA: Insufficient documentation

## 2014-11-27 DIAGNOSIS — R000 Tachycardia, unspecified: Secondary | ICD-10-CM | POA: Diagnosis not present

## 2014-11-27 DIAGNOSIS — A4101 Sepsis due to Methicillin susceptible Staphylococcus aureus: Secondary | ICD-10-CM | POA: Insufficient documentation

## 2014-11-27 DIAGNOSIS — E669 Obesity, unspecified: Secondary | ICD-10-CM | POA: Diagnosis not present

## 2014-11-27 DIAGNOSIS — J690 Pneumonitis due to inhalation of food and vomit: Principal | ICD-10-CM | POA: Insufficient documentation

## 2014-11-27 DIAGNOSIS — F419 Anxiety disorder, unspecified: Secondary | ICD-10-CM | POA: Diagnosis not present

## 2014-11-27 DIAGNOSIS — F17210 Nicotine dependence, cigarettes, uncomplicated: Secondary | ICD-10-CM | POA: Diagnosis not present

## 2014-11-27 DIAGNOSIS — J15211 Pneumonia due to Methicillin susceptible Staphylococcus aureus: Secondary | ICD-10-CM | POA: Insufficient documentation

## 2014-11-27 DIAGNOSIS — R112 Nausea with vomiting, unspecified: Secondary | ICD-10-CM | POA: Insufficient documentation

## 2014-11-27 DIAGNOSIS — F840 Autistic disorder: Secondary | ICD-10-CM | POA: Insufficient documentation

## 2014-11-27 HISTORY — DX: Vomiting, unspecified: R11.10

## 2014-11-27 LAB — COMPREHENSIVE METABOLIC PANEL
ALBUMIN: 4.1 g/dL (ref 3.5–5.0)
ALK PHOS: 75 U/L (ref 38–126)
ALT: 92 U/L — ABNORMAL HIGH (ref 17–63)
ANION GAP: 13 (ref 5–15)
AST: 41 U/L (ref 15–41)
BUN: 20 mg/dL (ref 6–20)
CALCIUM: 9.1 mg/dL (ref 8.9–10.3)
CO2: 20 mmol/L — ABNORMAL LOW (ref 22–32)
Chloride: 106 mmol/L (ref 101–111)
Creatinine, Ser: 0.67 mg/dL (ref 0.61–1.24)
GFR calc Af Amer: >60 mL/min (ref >60)
GFR calc non Af Amer: >60 mL/min (ref >60)
GLUCOSE: 122 mg/dL — AB (ref 65–99)
Potassium: 4.4 mmol/L (ref 3.5–5.1)
SODIUM: 139 mmol/L (ref 135–145)
Total Bilirubin: 1.4 mg/dL — ABNORMAL HIGH (ref 0.3–1.2)
Total Protein: 7.8 g/dL (ref 6.5–8.1)

## 2014-11-27 LAB — CBC
HCT: 46.6 % (ref 40.0–52.0)
Hemoglobin: 15.3 g/dL (ref 13.0–18.0)
MCH: 28.8 pg (ref 26.0–34.0)
MCHC: 32.8 g/dL (ref 32.0–36.0)
MCV: 87.8 fL (ref 80.0–100.0)
PLATELETS: 429 10*3/uL (ref 150–440)
RBC: 5.31 MIL/uL (ref 4.40–5.90)
RDW: 14 % (ref 11.5–14.5)
WBC: 18.7 10*3/uL — AB (ref 3.8–10.6)

## 2014-11-27 LAB — URINE DRUG SCREEN, QUALITATIVE (ARMC ONLY)
Amphetamines, Ur Screen: NOT DETECTED
BARBITURATES, UR SCREEN: NOT DETECTED
BENZODIAZEPINE, UR SCRN: POSITIVE — AB
CANNABINOID 50 NG, UR ~~LOC~~: POSITIVE — AB
Cocaine Metabolite,Ur ~~LOC~~: NOT DETECTED
MDMA (Ecstasy)Ur Screen: NOT DETECTED
METHADONE SCREEN, URINE: NOT DETECTED
OPIATE, UR SCREEN: NOT DETECTED
PHENCYCLIDINE (PCP) UR S: NOT DETECTED
Tricyclic, Ur Screen: POSITIVE — AB

## 2014-11-27 LAB — URINALYSIS COMPLETE WITH MICROSCOPIC (ARMC ONLY)
Bilirubin Urine: NEGATIVE
Glucose, UA: NEGATIVE mg/dL
HGB URINE DIPSTICK: NEGATIVE
LEUKOCYTES UA: NEGATIVE
Nitrite: NEGATIVE
PROTEIN: 30 mg/dL — AB
RBC / HPF: NONE SEEN RBC/hpf (ref 0–5)
SPECIFIC GRAVITY, URINE: 1.031 — AB (ref 1.005–1.030)
pH: 5 (ref 5.0–8.0)

## 2014-11-27 MED ORDER — ONDANSETRON HCL 4 MG/2ML IJ SOLN
4.0000 mg | INTRAMUSCULAR | Status: AC
  Administered 2014-11-27: 4 mg via INTRAVENOUS
  Filled 2014-11-27: qty 2

## 2014-11-27 MED ORDER — ONDANSETRON HCL 4 MG/2ML IJ SOLN: 4.0000 mg | Freq: Four times a day (QID) | INTRAMUSCULAR | Status: DC | PRN

## 2014-11-27 MED ORDER — QUETIAPINE FUMARATE 100 MG PO TABS
100.0000 mg | ORAL_TABLET | Freq: Every morning | ORAL | Status: DC
  Administered 2014-11-28 – 2014-11-29 (×2): 100 mg via ORAL
  Filled 2014-11-27 (×2): qty 1

## 2014-11-27 MED ORDER — PANTOPRAZOLE SODIUM 40 MG IV SOLR
40.0000 mg | Freq: Two times a day (BID) | INTRAVENOUS | Status: DC
  Administered 2014-11-27 – 2014-11-29 (×4): 40 mg via INTRAVENOUS
  Filled 2014-11-27 (×4): qty 40

## 2014-11-27 MED ORDER — ACETAMINOPHEN 650 MG RE SUPP: 650.0000 mg | Freq: Four times a day (QID) | RECTAL | Status: DC | PRN

## 2014-11-27 MED ORDER — ONDANSETRON HCL 4 MG PO TABS
4.0000 mg | ORAL_TABLET | Freq: Four times a day (QID) | ORAL | Status: DC | PRN
  Administered 2014-11-28: 4 mg via ORAL
  Filled 2014-11-27 (×2): qty 1

## 2014-11-27 MED ORDER — LORAZEPAM 2 MG/ML IJ SOLN
1.0000 mg | Freq: Once | INTRAMUSCULAR | Status: AC
Start: 2014-11-27 — End: 2014-11-27
  Administered 2014-11-27: 1 mg via INTRAVENOUS
  Filled 2014-11-27: qty 1

## 2014-11-27 MED ORDER — PIPERACILLIN-TAZOBACTAM 3.375 G IVPB
3.3750 g | Freq: Four times a day (QID) | INTRAVENOUS | Status: DC
  Filled 2014-11-27: qty 50

## 2014-11-27 MED ORDER — ACETAMINOPHEN 325 MG PO TABS
650.0000 mg | ORAL_TABLET | Freq: Four times a day (QID) | ORAL | Status: DC | PRN
  Administered 2014-11-29: 650 mg via ORAL
  Filled 2014-11-27: qty 2

## 2014-11-27 MED ORDER — LAMOTRIGINE 25 MG PO TABS
150.0000 mg | ORAL_TABLET | Freq: Two times a day (BID) | ORAL | Status: DC
  Administered 2014-11-27 – 2014-11-29 (×4): 150 mg via ORAL
  Filled 2014-11-27: qty 1
  Filled 2014-11-27 (×3): qty 2
  Filled 2014-11-27: qty 1
  Filled 2014-11-27: qty 2

## 2014-11-27 MED ORDER — QUETIAPINE FUMARATE 100 MG PO TABS
200.0000 mg | ORAL_TABLET | Freq: Every day | ORAL | Status: DC
  Administered 2014-11-27 – 2014-11-28 (×2): 200 mg via ORAL
  Filled 2014-11-27 (×2): qty 2

## 2014-11-27 MED ORDER — CITALOPRAM HYDROBROMIDE 20 MG PO TABS
40.0000 mg | ORAL_TABLET | Freq: Every day | ORAL | Status: DC
  Administered 2014-11-27 – 2014-11-29 (×3): 40 mg via ORAL
  Filled 2014-11-27 (×3): qty 2

## 2014-11-27 MED ORDER — PIPERACILLIN-TAZOBACTAM 3.375 G IVPB
3.3750 g | Freq: Four times a day (QID) | INTRAVENOUS | Status: DC
  Administered 2014-11-27: 3.375 g via INTRAVENOUS
  Filled 2014-11-27 (×3): qty 50

## 2014-11-27 MED ORDER — SODIUM CHLORIDE 0.9 % IV SOLN
INTRAVENOUS | Status: DC
  Administered 2014-11-27: 16:00:00 via INTRAVENOUS

## 2014-11-27 MED ORDER — LEVOTHYROXINE SODIUM 75 MCG PO TABS
75.0000 ug | ORAL_TABLET | Freq: Every day | ORAL | Status: DC
  Administered 2014-11-28 – 2014-11-29 (×2): 75 ug via ORAL
  Filled 2014-11-27 (×2): qty 1

## 2014-11-27 MED ORDER — SODIUM CHLORIDE 0.9 % IV BOLUS (SEPSIS)
1000.0000 mL | Freq: Once | INTRAVENOUS | Status: AC
  Administered 2014-11-27: 1000 mL via INTRAVENOUS

## 2014-11-27 MED ORDER — PIPERACILLIN-TAZOBACTAM 3.375 G IVPB
3.3750 g | Freq: Three times a day (TID) | INTRAVENOUS | Status: DC
  Administered 2014-11-27 – 2014-11-28 (×3): 3.375 g via INTRAVENOUS
  Filled 2014-11-27 (×6): qty 50

## 2014-11-27 NOTE — Progress Notes (Signed)
Dr. Clint Guy notified of patient not on the floor. Patient arrived back on the unit at 2055 during conversation with MD. No new orders giving. Nursing will continue to monitor.

## 2014-11-27 NOTE — ED Notes (Signed)
CBC reordered due to lab accidentally discontinuing. MD Quale made aware.

## 2014-11-27 NOTE — H&P (Signed)
[facility] [place] [facility]   PATIENT NAME: [name]    MR#:  [id number]  DATE OF BIRTH:  Jul 09, 1990  DATE OF ADMISSION:  11/27/2014  PRIMARY CARE PHYSICIAN: Derwood Kaplan, MD   REQUESTING/REFERRING PHYSICIAN: Sharyn Creamer  CHIEF COMPLAINT:   Chief Complaint  Patient presents with  . Emesis  . Cough    HISTORY OF PRESENT ILLNESS:  [name]  is a 24 y.o. male with a known history of recent hospitalization with likely overdose and found to have an MSSA pneumonia. He was doing better with regards to his pneumonia. He had a prolonged hospital course requiring 2 intubations. And his respiratory status had improved her breathing on room air. He was seen by Dr. Guss Bunde psychiatry over the weekend who recommended a follow-up psychiatry evaluation but she discontinued the involuntary commitment and the patient signed out AGAINST MEDICAL ADVICE. He came back the next day because of nausea and vomiting and then signed out AGAINST MEDICAL ADVICE again. Now he comes back the third day in a row. He states that he has had nausea vomiting and is unable to keep any food or medication down. He states that he has vomited up some blood tinged material. He states that he is unable to take his psychiatric medication. He is having some epigastric abdominal pain.  PAST MEDICAL HISTORY:   Past Medical History  Diagnosis Date  . Thyroid disease   . Depression   . Suicide attempt   . Bipolar 1 disorder   . PTSD (post-traumatic stress disorder)   . Hypothyroidism     PAST SURGICAL HISTORY:   Past Surgical History  Procedure Laterality Date  . Appendectomy      SOCIAL HISTORY:   Social History  Substance Use Topics  . Smoking status: Current Some Day Smoker -- 1.00 packs/day    Types: Cigarettes  . Smokeless tobacco: Not on file  . Alcohol Use: No    FAMILY HISTORY:   Family History  Problem Relation Age of Onset  . Breast cancer Mother      DRUG ALLERGIES:   Allergies  Allergen Reactions  . Sulfa Antibiotics Anaphylaxis    REVIEW OF SYSTEMS:  CONSTITUTIONAL: Positive for fever, chills and sweats. Positive for fatigue. Positive for weight loss. EYES: No blurred or double vision. Wears glasses EARS, NOSE, AND THROAT: No tinnitus or ear pain. No sore throat. Positive for runny nose RESPIRATORY: Positive for cough with dark brown phlegm, positive shortness of breath, no wheezing or hemoptysis.  CARDIOVASCULAR: Positive for chest pain.  GASTROINTESTINAL: Positive for nausea, vomiting, diarrhea and epigastric abdominal pain. No blood in bowel movements. GENITOURINARY: No dysuria, hematuria.  ENDOCRINE: No polyuria, nocturia,  HEMATOLOGY: No anemia, easy bruising or bleeding SKIN: Excoriations on the upper chest MUSCULOSKELETAL: No joint pain or arthritis.   NEUROLOGIC: No tingling, numbness, weakness.  PSYCHIATRY: Positive for anxiety or depression. No suicidal or homicidal ideation  MEDICATIONS AT HOME:   Prior to Admission medications   Medication Sig Start Date End Date Taking? Authorizing Norine Reddington  amoxicillin-clavulanate (AUGMENTIN) 875-125 MG per tablet Take 1 tablet by mouth every 12 (twelve) hours. 11/25/14  Yes Richard Renae Gloss, MD  citalopram (CELEXA) 40 MG tablet Take 40 mg by mouth daily.   Yes Historical Lequan Dobratz, MD  esomeprazole (NEXIUM) 40 MG capsule Take 40 mg by mouth at bedtime.    Yes Historical Nakkia Mackiewicz, MD  lamoTRIgine (LAMICTAL) 150 MG tablet Take 150 mg by mouth 2 (two)  times daily.   Yes Historical Sion Thane, MD  levothyroxine (SYNTHROID, LEVOTHROID) 75 MCG tablet Take 75 mcg by mouth daily before breakfast.   Yes Historical Starlene Consuegra, MD  rizatriptan (MAXALT) 10 MG tablet Take 10 mg by mouth as needed for migraine. May repeat in 2 hours if needed   Yes Historical Kalep Full, MD  QUEtiapine (SEROQUEL) 100 MG tablet Take one tab in am and two tabs in the PM Patient not taking: Reported on 11/27/2014  11/25/14   Alford Highland, MD      VITAL SIGNS:  Blood pressure 132/90, pulse 105, temperature 98.1 F (36.7 C), temperature source Oral, resp. rate 16, height  (1.702 m), weight 149.687 kg (330 lb), SpO2 96 %.  PHYSICAL EXAMINATION:  GENERAL:  24 y.o.-year-old patient lying in the bed with no acute distress.  EYES: Pupils equal, round, reactive to light and accommodation. Dilated pupils. No scleral icterus. Extraocular muscles intact.  HEENT: Head atraumatic, normocephalic. Oropharynx and nasopharynx clear.  NECK:  Supple, no jugular venous distention. No thyroid enlargement, no tenderness.  LUNGS: Normal breath sounds bilaterally, no wheezing, rales,rhonchi or crepitation. No use of accessory muscles of respiration.  CARDIOVASCULAR: S1, S2 tachycardic. No murmurs, rubs, or gallops.  ABDOMEN: Soft, nontender, nondistended. Bowel sounds present. No organomegaly or mass.  EXTREMITIES: 2+ edema, no cyanosis, or clubbing.  NEUROLOGIC: Cranial nerves II through XII are intact. Muscle strength 5/5 in all extremities. Sensation intact. Gait not checked.  PSYCHIATRIC: The patient is alert and oriented x 3.  SKIN: No rash, lesion, or ulcer.   LABORATORY PANEL:   CBC  Recent Labs Lab 11/27/14 1359  WBC 18.7*  HGB 15.3  HCT 46.6  PLT 429   ------------------------------------------------------------------------------------------------------------------  Chemistries   Recent Labs Lab 11/27/14 1318  NA 139  K 4.4  CL 106  CO2 20*  GLUCOSE 122*  BUN 20  CREATININE 0.67  CALCIUM 9.1  AST 41  ALT 92*  ALKPHOS 75  BILITOT 1.4*   ------------------------------------------------------------------------------------------------------------------  Cardiac Enzymes  Recent Labs Lab 11/26/14 0005  TROPONINI <0.03   ------------------------------------------------------------------------------------------------------------------  RADIOLOGY:  Dg Chest 2 View  11/26/2014    CLINICAL DATA:  Recent discharge following pneumonia and being placed on ventilator, dilated pupils, vomiting, cough, shortness of breath, smoker  EXAM: CHEST  2 VIEW  COMPARISON:  11/21/2014  FINDINGS: Normal heart size, mediastinal contours, and pulmonary vascularity.  Mild peribronchial thickening.  Minimal basilar atelectasis anteriorly.  No pulmonary infiltrate, pleural effusion, or pneumothorax.  Bones unremarkable.  IMPRESSION: Mild bronchitic changes with minimal basilar atelectasis.   Electronically Signed   By: Ulyses Southward M.D.   On: 11/26/2014 01:07    EKG:   Sinus tachycardia  IMPRESSION AND PLAN:   1. Nausea vomiting with blood tinged vomitus. Could be Mallory-Weiss tear. Treat conservatively with IV fluid hydration and nausea medications. Patient takes Nexium at home will put on IV Protonix while here. 2. Leukocytosis and tachycardia- likely with nausea vomiting. Recheck in a.m. with IV fluid hydration. 3. MSSA pneumonia from previous hospitalization. I will give IV Zosyn while here until able to tolerate by mouth medication. Unclear if he actually filled my Augmentin or not. 4. Bipolar disorder, recent hospitalization with overdose- I will get a psychiatric evaluation. Continue Seroquel Lamictal and Celexa. 5. Hypothyroidism unspecified continue levothyroxine. 6. Obesity  All the records are reviewed and case discussed with ED Kacey Dysert. Management plans discussed with the patient, family and they are in agreement.  CODE STATUS: Full code  TOTAL  TIME TAKING CARE OF THIS PATIENT: 50 minutes.    Alford Highland M.D on 11/27/2014 at 3:54 PM  Between 7am to 6pm - Pager - [phone]  After 6pm call admission pager [phone]  [facility]  Office  [phone]  CC: Primary care physician; Derwood Kaplan, MD

## 2014-11-27 NOTE — ED Notes (Signed)
Pt states he left AMA from our hospital earlier this week because he felt he was doing a lost better, states he will never do that again, is stating he thinks he is going to die, was on vent from aspirating 2 weeks ago. Now, c/o vomiting and cannot keep psych meds down, appears nervous in triage, states he is having a hard time breathing. sats 93-96% on RA.

## 2014-11-27 NOTE — Consult Note (Signed)
  Psychiatry: Consult received. Chart reviewed. I will follow-up with the patient in the morning as to further mental health evaluation.

## 2014-11-27 NOTE — Progress Notes (Signed)
Patient not in room, IV unplugged from port and running.Nursing staff searched unit. Press photographer and security notified. Nursing will continue to monitor.

## 2014-11-27 NOTE — ED Notes (Signed)
MD Quale at bedside. 

## 2014-11-27 NOTE — ED Notes (Signed)
Pt requesting ice chips, MD Quale made aware and verbalized okay to give pt some. Pt tolerated well.

## 2014-11-27 NOTE — ED Notes (Signed)
Denies si/hi

## 2014-11-27 NOTE — ED Provider Notes (Signed)
[facility] Emergency Department Harlean Regula Note REMINDER - THIS NOTE IS NOT A FINAL MEDICAL RECORD UNTIL IT IS SIGNED. UNTIL THEN, THE CONTENT BELOW MAY REFLECT INFORMATION FROM A DOCUMENTATION TEMPLATE, NOT THE ACTUAL PATIENT VISIT. ____________________________________________  Time seen: Approximately 1:58 PM  I have reviewed the triage vital signs and the nursing notes.   HISTORY  Chief Complaint Emesis and Cough    HPI [name] is a 24 y.o. male with an extensive recent medical history including intubation after an overdose, developed sepsis. He reports he left the hospital but doesn't know why he left, and then returned yesterday and left because it was "taking too long". He reports today that he has had ongoing nausea and vomiting vomiting several times. He reports he still feels like he is having fevers and chills. He denies being in pain, does report a ongoing cough.  He does report that he feels as though he is to get his Celexa because he is not able to keep it down for a day or 2 and he sometimes starts he is shaky feeling Celexa. He denies recent drug use or alcohol use.   Past Medical History  Diagnosis Date  . Thyroid disease   . Depression   . Suicide attempt   . Bipolar 1 disorder   . PTSD (post-traumatic stress disorder)   . Hypothyroidism     Patient Active Problem List   Diagnosis Date Noted  . Dyspnea 11/26/2014  . Leucocytosis 11/26/2014  . Autism 11/16/2014  . Bipolar 1 disorder 11/16/2014  . ARDS (adult respiratory distress syndrome) 11/16/2014  . Severe sepsis with acute organ dysfunction 11/16/2014  . Aspiration pneumonia 11/15/2014  . Overdose   . Acute respiratory failure with hypoxia 11/13/2014    Past Surgical History  Procedure Laterality Date  . Appendectomy      Current Outpatient Rx  Name  Route  Sig  Dispense  Refill  . amoxicillin-clavulanate (AUGMENTIN) 875-125 MG per tablet   Oral   Take 1 tablet by  mouth every 12 (twelve) hours.   10 tablet   0   . citalopram (CELEXA) 40 MG tablet   Oral   Take 40 mg by mouth daily.         Marland Kitchen esomeprazole (NEXIUM) 40 MG capsule   Oral   Take 40 mg by mouth at bedtime.          . lamoTRIgine (LAMICTAL) 150 MG tablet   Oral   Take 150 mg by mouth 2 (two) times daily.         Marland Kitchen levothyroxine (SYNTHROID, LEVOTHROID) 75 MCG tablet   Oral   Take 75 mcg by mouth daily before breakfast.         . rizatriptan (MAXALT) 10 MG tablet   Oral   Take 10 mg by mouth as needed for migraine. May repeat in 2 hours if needed         . QUEtiapine (SEROQUEL) 100 MG tablet      Take one tab in am and two tabs in the PM Patient not taking: Reported on 11/27/2014   90 tablet   0     Allergies Sulfa antibiotics  Family History  Problem Relation Age of Onset  . Family history unknown: Yes    Social History Social History  Substance Use Topics  . Smoking status: Current Some Day Smoker -- 1.00 packs/day    Types: Cigarettes  . Smokeless tobacco: None  . Alcohol Use:  No    Review of Systems Constitutional: See history of present illness Eyes: No visual changes. ENT: No sore throat. Cardiovascular: Denies chest pain. Respiratory: Cough, does continue to feel slightly short of breath Gastrointestinal: No abdominal pain.   No diarrhea.  No constipation. Genitourinary: Negative for dysuria. Musculoskeletal: Negative for back pain. Skin: Negative for rash. Neurological: Negative for headaches, focal weakness or numbness.  10-point ROS otherwise negative.  ____________________________________________   PHYSICAL EXAM:  VITAL SIGNS: ED Triage Vitals  Enc Vitals Group     BP 11/27/14 1254 154/101 mmHg     Pulse Rate 11/27/14 1254 109     Resp 11/27/14 1254 18     Temp 11/27/14 1254 98.1 F (36.7 C)     Temp Source 11/27/14 1254 Oral     SpO2 11/27/14 1254 96 %     Weight 11/27/14 1254 330 lb (149.687 kg)     Height 11/27/14  1254  (1.702 m)     Head Cir --      Peak Flow --      Pain Score 11/27/14 1255 7     Pain Loc --      Pain Edu? --      Excl. in GC? --    Constitutional: Alert anPatient does appear somewhat fatigued, poorly hygiene yes: Conjunctivae are normal. PERRL. EOMI.  Head: Atraumatic. Nose: No congestion/rhinnorhea. Mouth/Throat: Mucous membranes are  Dry. Oropharynx non-erythematous. Neck: No stridor.   Cardiovascular:  tachycardicr rhythm. Grossly normal heart sounds.  Good peripheral circulation. Respirat coarse lung sounds without obvious rales, No retractions. Lungs CTAB. no use of accessory muscles. No distress  Gastrointestinal: Soft and nontender. No distention. No CVA tenderness. Musculoskeletal: No lower extremity tenderness nor edema.  No joint effusions. Neurologic:  Normal speech and language. No gross focal neurologic deficits are appreciated. No gait instability. Skin:  Skin is warm, dry and intact. No rash noted. Psychiatric: Mood and affect aresomewhat flat and reserved.  ____________________________________________   LABS (all labs ordered are listed, but only abnormal results are displayed)  Labs Reviewed  COMPREHENSIVE METABOLIC PANEL - Abnormal; Notable for the following:    CO2 20 (*)    Glucose, Bld 122 (*)    ALT 92 (*)    Total Bilirubin 1.4 (*)    All other components within normal limits  CBC - Abnormal; Notable for the following:    WBC 18.7 (*)    All other components within normal limits  URINALYSIS COMPLETEWITH MICROSCOPIC ([facility] ONLY)   ____________________________________________  EKG   ____________________________________________  RADIOLOGY  Patient had radiographs done yesterday ____________________________________________   PROCEDURES  Procedure(s) performed: None  Critical Care performed: No  ____________________________________________   INITIAL IMPRESSION / ASSESSMENT AND PLAN / ED COURSE  Pertinent labs & imaging  results that were available during my care of the patient were reviewed by me and considered in my medical decision making (see chart for details).  Patient presents for concerns of ongoing fevers chills. He is known to be bacteremic with previous sepsis. He had an elevated white count yesterday and was to be admitted but left AMA. Today I discussed with the patient and we will plan to admit him to the hospital, he is amenable to staying today. He is slightly anxious we will give him some Ativan for this as well as Zofran, hydrate and then reinitiate antibiotic's. ____________________________________________   FINAL CLINICAL IMPRESSION(S) / ED DIAGNOSES  Final diagnoses:  Sepsis due to Methicillin susceptible Staphylococcus aureus  Sharyn Creamer, MD 11/27/14 1520

## 2014-11-27 NOTE — Progress Notes (Signed)
Patient states " I messed up I'm not here to play games". Patient denies going off campus. Patient states that he just went to the vending machine. Patient cooperative and calm. Patient teaching giving to patient on use of call bell and need to communicate with nursing staff.

## 2014-11-28 DIAGNOSIS — F319 Bipolar disorder, unspecified: Secondary | ICD-10-CM | POA: Diagnosis not present

## 2014-11-28 LAB — CBC
HCT: 42.3 % (ref 40.0–52.0)
Hemoglobin: 14.1 g/dL (ref 13.0–18.0)
MCH: 29.1 pg (ref 26.0–34.0)
MCHC: 33.2 g/dL (ref 32.0–36.0)
MCV: 87.5 fL (ref 80.0–100.0)
PLATELETS: 326 10*3/uL (ref 150–440)
RBC: 4.84 MIL/uL (ref 4.40–5.90)
RDW: 13.8 % (ref 11.5–14.5)
WBC: 10.8 10*3/uL — AB (ref 3.8–10.6)

## 2014-11-28 LAB — BASIC METABOLIC PANEL
Anion gap: 8 (ref 5–15)
BUN: 18 mg/dL (ref 6–20)
CO2: 24 mmol/L (ref 22–32)
CREATININE: 0.71 mg/dL (ref 0.61–1.24)
Calcium: 8.6 mg/dL — ABNORMAL LOW (ref 8.9–10.3)
Chloride: 108 mmol/L (ref 101–111)
GFR calc Af Amer: >60 mL/min (ref >60)
Glucose, Bld: 94 mg/dL (ref 65–99)
POTASSIUM: 3.6 mmol/L (ref 3.5–5.1)
SODIUM: 140 mmol/L (ref 135–145)

## 2014-11-28 MED ORDER — ENOXAPARIN SODIUM 40 MG/0.4ML ~~LOC~~ SOLN
40.0000 mg | Freq: Two times a day (BID) | SUBCUTANEOUS | Status: DC
  Filled 2014-11-28 (×2): qty 0.4

## 2014-11-28 MED ORDER — LORAZEPAM 1 MG PO TABS
1.0000 mg | ORAL_TABLET | Freq: Four times a day (QID) | ORAL | Status: DC | PRN
  Administered 2014-11-28 – 2014-11-29 (×2): 1 mg via ORAL
  Filled 2014-11-28 (×3): qty 1

## 2014-11-28 MED ORDER — PIPERACILLIN-TAZOBACTAM 4.5 G IVPB
4.5000 g | Freq: Three times a day (TID) | INTRAVENOUS | Status: DC
  Administered 2014-11-28 – 2014-11-29 (×3): 4.5 g via INTRAVENOUS
  Filled 2014-11-28 (×6): qty 100

## 2014-11-28 NOTE — Care Management Note (Signed)
Case Management Note  Patient Details  Name: [name] MRN: [id number] Date of Birth: Sep 18, 1990  Subjective/Objective:                   Patient recurrent admission to hospital after recent admission for heroin use and aspiration pneumonia. He states that "was the last time he used heroin". He states he has "lost 4 friends to drug abuse". He states his last ETOH consumption was in Feb of this year. He states he is followed at [facility] and knows that he should follow up. He admits to me that "he does not have a problem with drug abuse".   He is independent with mobility. He lives with his father and sister- they both drive but patient does not. His father pays for his health insurance. His PCP is with Dr. Toy Cookey and he already had a follow up appointment on file at [facility] office.   Action/Plan: I placed follow up appointment on discharge instructions. No further RNCM needs. CSW consult placed due to compliance issues and drug abuse although patient may or may not comply.   Expected Discharge Date:                  Expected Discharge Plan:     In-House Referral:  Clinical Social Work  Discharge planning Services  CM Consult, Follow-up appt scheduled  Post Acute Care Choice:    Choice offered to:  Patient  DME Arranged:  N/A DME Agency:     HH Arranged:    HH Agency:     Status of Service:  Completed, signed off  Medicare Important Message Given:    Date Medicare IM Given:    Medicare IM give by:    Date Additional Medicare IM Given:    Additional Medicare Important Message give by:     If discussed at Long Length of Stay Meetings, dates discussed:    Additional Comments:  Collie Siad, RN 11/28/2014, 10:59 AM

## 2014-11-28 NOTE — Consult Note (Signed)
[facility] Consult   Reason for Consult:  Consult for this 24 year old man with a history of bipolar disorder and substance abuse Referring Physician:  Earleen Newport Patient Identification: [name] MRN:  [id number] Principal Diagnosis: Bipolar 1 disorder Diagnosis:   Patient Active Problem List   Diagnosis Date Noted  . Vomiting [R11.10] 11/27/2014  . Dyspnea [R06.00] 11/26/2014  . Leucocytosis [D72.829] 11/26/2014  . Autism [F84.0] 11/16/2014  . Bipolar 1 disorder [F31.9] 11/16/2014  . ARDS (adult respiratory distress syndrome) [J80] 11/16/2014  . Severe sepsis with acute organ dysfunction [A41.9, R65.20] 11/16/2014  . Aspiration pneumonia [J69.0] 11/15/2014  . Overdose [T50.901A]   . Acute respiratory failure with hypoxia [J96.01] 11/13/2014    Total Time spent with patient: 1 hour  Subjective:   [name] is a 24 y.o. male patient admitted with "I'm feeling better now".  HPI:  Information from the patient and the chart. Also my familiarity with the patient from past admissions. Patient came into the hospital yesterday complaining of nausea and vomiting and abdominal tenderness. Also continuing to have shortness of breath. He is diagnosed with a pneumonia probably related to his recent intubation and stay in the critical care unit. Patient tells me that his mood currently is feeling anxious but not depressed. He completely denies suicidal thoughts. Says that having pneumonia has made him actually feel afraid of dying. He is not having any auditory or visual hallucinations. Sleep has been poor. Appetite is decreased because he is still having nausea. Patient says that before his previous hospitalization he had tried to kill himself by overdosing. He doesn't remember what all the medicines were that he had taken. He does remember that he had feel depressed and overwhelmed at that time. He admits that he continues to use marijuana and gets prescribed benzodiazepine's  or gets them off the street but has not been abusing her own or opiates as an outpatient.  Past psychiatric history: Long history of mood instability and substance abuse. Several prior psychiatric admissions. At his worst he has been psychotic but probably it was related to substance abuse as much as his mood symptoms. Diagnosis of bipolar disorder that has presented depressed and manic. Followed by Dr. Kasandra Knudsen in [place]. Patient does have a history of prior suicide attempts.  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 He is reportedly staying sober now and has actually been taking naltrexone as an outpatient. He admits that he continues to use marijuana.  Social history: He is living with his father. His mother died at the beginning of this year which she reports is a major stress on him and one of the things that makes him most sad. He is currently not working outside the home.  Family history: Positive for depression  Medical history: Current pneumonia. Recent hospitalization for an overdose. HPI Elements:   Quality:  Depression and anxiety. Severity:  Moderate and improving. Timing:  Bad before last hospitalization but now much improved. Duration:  Chronic intermittent problem. Context:  Death of his mother. Chronic illness.  Past Medical History:  Past Medical History  Diagnosis Date  . Thyroid disease   . Depression   . Suicide attempt   . Bipolar 1 disorder   . PTSD (post-traumatic stress disorder)   . Hypothyroidism     Past Surgical History  Procedure Laterality Date  . Appendectomy     Family History:  Family History  Problem Relation Age of Onset  .  Breast cancer Mother    Social History:  History  Alcohol Use No     History  Drug Use No    Social History   Social History  . Marital Status: Single    Spouse Name: N/A  . Number of Children: N/A  . Years of Education: N/A   Social History  Main Topics  . Smoking status: Current Some Day Smoker -- 1.00 packs/day    Types: Cigarettes  . Smokeless tobacco: None  . Alcohol Use: No  . Drug Use: No  . Sexual Activity: Not Asked   Other Topics Concern  . None   Social History Narrative   Additional Social History:                          Allergies:   Allergies  Allergen Reactions  . Sulfa Antibiotics Anaphylaxis    Labs:  Results for orders placed or performed during the hospital encounter of 11/27/14 (from the past 48 hour(s))  Comprehensive metabolic panel     Status: Abnormal   Collection Time: 11/27/14  1:18 PM  Result Value Ref Range   Sodium 139 135 - 145 mmol/L   Potassium 4.4 3.5 - 5.1 mmol/L    Comment: HEMOLYSIS AT THIS LEVEL MAY AFFECT RESULT   Chloride 106 101 - 111 mmol/L   CO2 20 (L) 22 - 32 mmol/L   Glucose, Bld 122 (H) 65 - 99 mg/dL   BUN 20 6 - 20 mg/dL   Creatinine, Ser 0.67 0.61 - 1.24 mg/dL   Calcium 9.1 8.9 - 10.3 mg/dL   Total Protein 7.8 6.5 - 8.1 g/dL   Albumin 4.1 3.5 - 5.0 g/dL   AST 41 15 - 41 U/L    Comment: HEMOLYSIS AT THIS LEVEL MAY AFFECT RESULT   ALT 92 (H) 17 - 63 U/L    Comment: HEMOLYSIS AT THIS LEVEL MAY AFFECT RESULT   Alkaline Phosphatase 75 38 - 126 U/L   Total Bilirubin 1.4 (H) 0.3 - 1.2 mg/dL    Comment: HEMOLYSIS AT THIS LEVEL MAY AFFECT RESULT   GFR calc non Af Amer >60 >60 mL/min   GFR calc Af Amer >60 >60 mL/min    Comment: (NOTE) The eGFR has been calculated using the CKD EPI equation. This calculation has not been validated in all clinical situations. eGFR's persistently <60 mL/min signify possible Chronic Kidney Disease.    Anion gap 13 5 - 15  Urinalysis complete, with microscopic ([facility] only)     Status: Abnormal   Collection Time: 11/27/14  1:59 PM  Result Value Ref Range   Color, Urine AMBER (A) YELLOW   APPearance CLEAR (A) CLEAR   Glucose, UA NEGATIVE NEGATIVE mg/dL   Bilirubin Urine NEGATIVE NEGATIVE   Ketones, ur 1+ (A) NEGATIVE  mg/dL   Specific Gravity, Urine 1.031 (H) 1.005 - 1.030   Hgb urine dipstick NEGATIVE NEGATIVE   pH 5.0 5.0 - 8.0   Protein, ur 30 (A) NEGATIVE mg/dL   Nitrite NEGATIVE NEGATIVE   Leukocytes, UA NEGATIVE NEGATIVE   RBC / HPF NONE SEEN 0 - 5 RBC/hpf   WBC, UA TOO NUMEROUS TO COUNT 0 - 5 WBC/hpf   Bacteria, UA RARE (A) NONE SEEN   Squamous Epithelial / LPF 0-5 (A) NONE SEEN   Mucous PRESENT    Hyaline Casts, UA PRESENT   CBC     Status: Abnormal   Collection Time: 11/27/14  1:59 PM  Result Value Ref Range   WBC 18.7 (H) 3.8 - 10.6 K/uL   RBC 5.31 4.40 - 5.90 MIL/uL   Hemoglobin 15.3 13.0 - 18.0 g/dL   HCT 46.6 40.0 - 52.0 %   MCV 87.8 80.0 - 100.0 fL   MCH 28.8 26.0 - 34.0 pg   MCHC 32.8 32.0 - 36.0 g/dL   RDW 14.0 11.5 - 14.5 %   Platelets 429 150 - 440 K/uL  Urine Drug Screen, Qualitative ([facility] only)     Status: Abnormal   Collection Time: 11/27/14  1:59 PM  Result Value Ref Range   Tricyclic, Ur Screen POSITIVE (A) NONE DETECTED   Amphetamines, Ur Screen NONE DETECTED NONE DETECTED   MDMA (Ecstasy)Ur Screen NONE DETECTED NONE DETECTED   Cocaine Metabolite,Ur [place] NONE DETECTED NONE DETECTED   Opiate, Ur Screen NONE DETECTED NONE DETECTED   Phencyclidine (PCP) Ur S NONE DETECTED NONE DETECTED   Cannabinoid 50 Ng, Ur [place] POSITIVE (A) NONE DETECTED   Barbiturates, Ur Screen NONE DETECTED NONE DETECTED   Benzodiazepine, Ur Scrn POSITIVE (A) NONE DETECTED   Methadone Scn, Ur NONE DETECTED NONE DETECTED    Comment: (NOTE) 810  Tricyclics, urine               Cutoff 1000 ng/mL 200  Amphetamines, urine             Cutoff 1000 ng/mL 300  MDMA (Ecstasy), urine           Cutoff 500 ng/mL 400  Cocaine Metabolite, urine       Cutoff 300 ng/mL 500  Opiate, urine                   Cutoff 300 ng/mL 600  Phencyclidine (PCP), urine      Cutoff 25 ng/mL 700  Cannabinoid, urine              Cutoff 50 ng/mL 800  Barbiturates, urine             Cutoff 200 ng/mL 900  Benzodiazepine, urine            Cutoff 200 ng/mL 1000 Methadone, urine                Cutoff 300 ng/mL 1100 1200 The urine drug screen provides only a preliminary, unconfirmed 1300 analytical test result and should not be used for non-medical 1400 purposes. Clinical consideration and professional judgment should 1500 be applied to any positive drug screen result due to possible 1600 interfering substances. A more specific alternate chemical method 1700 must be used in order to obtain a confirmed analytical result.  1800 Gas chromato graphy / mass spectrometry (GC/MS) is the preferred 1900 confirmatory method.   Basic metabolic panel     Status: Abnormal   Collection Time: 11/28/14  8:59 AM  Result Value Ref Range   Sodium 140 135 - 145 mmol/L   Potassium 3.6 3.5 - 5.1 mmol/L   Chloride 108 101 - 111 mmol/L   CO2 24 22 - 32 mmol/L   Glucose, Bld 94 65 - 99 mg/dL   BUN 18 6 - 20 mg/dL   Creatinine, Ser 0.71 0.61 - 1.24 mg/dL   Calcium 8.6 (L) 8.9 - 10.3 mg/dL   GFR calc non Af Amer >60 >60 mL/min   GFR calc Af Amer >60 >60 mL/min    Comment: (NOTE) The eGFR has been calculated using the CKD EPI equation. This calculation has not been validated in all  clinical situations. eGFR's persistently <60 mL/min signify possible Chronic Kidney Disease.    Anion gap 8 5 - 15  CBC     Status: Abnormal   Collection Time: 11/28/14  8:59 AM  Result Value Ref Range   WBC 10.8 (H) 3.8 - 10.6 K/uL   RBC 4.84 4.40 - 5.90 MIL/uL   Hemoglobin 14.1 13.0 - 18.0 g/dL   HCT 42.3 40.0 - 52.0 %   MCV 87.5 80.0 - 100.0 fL   MCH 29.1 26.0 - 34.0 pg   MCHC 33.2 32.0 - 36.0 g/dL   RDW 13.8 11.5 - 14.5 %   Platelets 326 150 - 440 K/uL    Vitals: Blood pressure 159/85, pulse 86, temperature 98.3 F (36.8 C), temperature source Oral, resp. rate 16, height '5\' 7"'  (1.702 m), weight 149.687 kg (330 lb), SpO2 93 %.  Risk to Self: Is patient at risk for suicide?: No Risk to Others:   Prior Inpatient Therapy:   Prior Outpatient  Therapy:    Current Facility-Administered Medications  Medication Dose Route Frequency Provider Last Rate Last Dose  . acetaminophen (TYLENOL) tablet 650 mg  650 mg Oral Q6H PRN Loletha Grayer, MD       Or  . acetaminophen (TYLENOL) suppository 650 mg  650 mg Rectal Q6H PRN Loletha Grayer, MD      . citalopram (CELEXA) tablet 40 mg  40 mg Oral Daily Loletha Grayer, MD   40 mg at 11/28/14 0930  . enoxaparin (LOVENOX) injection 40 mg  40 mg Subcutaneous Q12H Aldean Jewett, MD      . lamoTRIgine (LAMICTAL) tablet 150 mg  150 mg Oral BID Loletha Grayer, MD   150 mg at 11/28/14 0930  . levothyroxine (SYNTHROID, LEVOTHROID) tablet 75 mcg  75 mcg Oral QAC breakfast Loletha Grayer, MD   75 mcg at 11/28/14 0929  . LORazepam (ATIVAN) tablet 1 mg  1 mg Oral Q6H PRN Gonzella Lex, MD      . ondansetron [facility]) tablet 4 mg  4 mg Oral Q6H PRN Loletha Grayer, MD   4 mg at 11/28/14 1620   Or  . ondansetron (ZOFRAN) injection 4 mg  4 mg Intravenous Q6H PRN Loletha Grayer, MD      . pantoprazole (PROTONIX) injection 40 mg  40 mg Intravenous Q12H Loletha Grayer, MD   40 mg at 11/28/14 0930  . piperacillin-tazobactam (ZOSYN) IVPB 4.5 g  4.5 g Intravenous 3 times per day Aldean Jewett, MD      . QUEtiapine (SEROQUEL) tablet 100 mg  100 mg Oral q morning - 10a Loletha Grayer, MD   100 mg at 11/28/14 0930  . QUEtiapine (SEROQUEL) tablet 200 mg  200 mg Oral QHS Loletha Grayer, MD   200 mg at 11/27/14 2122    Musculoskeletal: Strength & Muscle Tone: within normal limits Gait & Station: normal Patient leans: N/A  Psychiatric Specialty Exam: Physical Exam  Nursing note and vitals reviewed. Constitutional: He appears well-developed and well-nourished.  HENT:  Head: Normocephalic and atraumatic.  Eyes: Conjunctivae are normal. Pupils are equal, round, and reactive to light.  Neck: Normal range of motion.  Cardiovascular: Normal heart sounds.   Respiratory: He is in respiratory distress.   GI: Soft. There is tenderness.  Musculoskeletal: Normal range of motion.  Neurological: He is alert.  Skin: Skin is warm and dry.  Psychiatric: His speech is normal and behavior is normal. Judgment and thought content normal. His mood appears anxious. Cognition and memory  are normal.    Review of Systems  Constitutional: Negative.   HENT: Negative.   Eyes: Negative.   Respiratory: Positive for cough and shortness of breath.   Cardiovascular: Negative.   Gastrointestinal: Positive for nausea, vomiting and abdominal pain.  Musculoskeletal: Negative.   Skin: Negative.   Neurological: Negative.   Psychiatric/Behavioral: Negative for depression, suicidal ideas, hallucinations and substance abuse. The patient is nervous/anxious and has insomnia.     Blood pressure 159/85, pulse 86, temperature 98.3 F (36.8 C), temperature source Oral, resp. rate 16, height '5\' 7"'  (1.702 m), weight 149.687 kg (330 lb), SpO2 93 %.Body mass index is 51.67 kg/(m^2).  General Appearance: Fairly Groomed  Engineer, water::  Good  Speech:  Normal Rate  Volume:  Decreased  Mood:  Anxious  Affect:  Appropriate  Thought Process:  Logical  Orientation:  Full (Time, Place, and Person)  Thought Content:  Negative  Suicidal Thoughts:  No  Homicidal Thoughts:  No  Memory:  Immediate;   Good Recent;   Poor Remote;   Fair  Judgement:  Fair  Insight:  Fair  Psychomotor Activity:  Normal  Concentration:  Fair  Recall:  [employer] [place]  Language: Fair  Akathisia:  No  Handed:  Right  AIMS (if indicated):     Assets:  Communication Skills Desire for Improvement Housing Resilience Social Support  ADL's:  Intact  Cognition: WNL  Sleep:      Medical Decision Making: Review of Psycho-Social Stressors (1), Review or order clinical lab tests (1), Established Problem, Worsening (2), Review or order medicine tests (1) and Review of Medication Regimen & Side Effects (2)  Treatment Plan  Summary: Medication management and Plan Patient is reporting improvement in his mood symptoms. No current suicidal ideation. No evidence of psychosis. He has been taking Celexa and lamotrigine prescribed by his outpatient doctor. He has been restarted on that medication here. He is also being prescribed Seroquel which is helpful for his bipolar depression in which she tolerates well. Continue all of that current medication. When necessary low dose of Ativan just for anxiety in the hospital. Definitely do not provided at discharge. Supportive therapy and counseling. He will make sure to follow-up with Dr. Kasandra Knudsen. I will continue to follow up daily. No other change to treatment plan.  Plan:  No evidence of imminent risk to self or others at present.   Patient does not meet criteria for psychiatric inpatient admission. Supportive therapy provided about ongoing stressors. Disposition: Supportive counseling. Continue current medicine. No need for inpatient psychiatric hospitalization.  [name] 11/28/2014 4:28 PM

## 2014-11-28 NOTE — Progress Notes (Signed)
[facility] - [place] at [facility]   PATIENT NAME: [name]    MR#:  [id number]  DATE OF BIRTH:  [birth year]  SUBJECTIVE:  CHIEF COMPLAINT:   Chief Complaint  Patient presents with  . Emesis  . Cough   Respiratory status much improved. Still nauseated. Has not been able to hold anything down yet.  REVIEW OF SYSTEMS:   Review of Systems  Constitutional: Negative for fever.  Respiratory: Negative for shortness of breath.   Cardiovascular: Negative for chest pain and palpitations.  Gastrointestinal: Positive for nausea and vomiting. Negative for abdominal pain, diarrhea, constipation and melena.  Genitourinary: Negative for dysuria.    DRUG ALLERGIES:   Allergies  Allergen Reactions  . Sulfa Antibiotics Anaphylaxis    VITALS:  Blood pressure 164/78, pulse 89, temperature 98.6 F (37 C), temperature source Oral, resp. rate 17, height  (1.702 m), weight 149.687 kg (330 lb), SpO2 97 %.  PHYSICAL EXAMINATION:  GENERAL:  24 y.o.-year-old patient sitting up in the bed with no acute distress. Obese LUNGS: Normal breath sounds bilaterally, no wheezing, rales,rhonchi or crepitation. No use of accessory muscles of respiration.  CARDIOVASCULAR: S1, S2 normal. No murmurs, rubs, or gallops.  ABDOMEN: Soft, nontender, nondistended. Bowel sounds present. No organomegaly or mass.  EXTREMITIES: No pedal edema, cyanosis, or clubbing.  NEUROLOGIC: Cranial nerves II through XII are intact. Muscle strength 5/5 in all extremities. Sensation intact. Gait not checked.  PSYCHIATRIC: The patient is alert and oriented x 3. Flat affect seems depressed SKIN: No obvious rash, lesion, or ulcer. Multiple tattoos   LABORATORY PANEL:   CBC  Recent Labs Lab 11/28/14 0859  WBC 10.8*  HGB 14.1  HCT 42.3  PLT 326   ------------------------------------------------------------------------------------------------------------------  Chemistries   Recent Labs Lab  11/27/14 1318 11/28/14 0859  NA 139 140  K 4.4 3.6  CL 106 108  CO2 20* 24  GLUCOSE 122* 94  BUN 20 18  CREATININE 0.67 0.71  CALCIUM 9.1 8.6*  AST 41  --   ALT 92*  --   ALKPHOS 75  --   BILITOT 1.4*  --    ------------------------------------------------------------------------------------------------------------------  Cardiac Enzymes  Recent Labs Lab 11/26/14 0005  TROPONINI <0.03   ------------------------------------------------------------------------------------------------------------------  RADIOLOGY:  No results found.  EKG:   Orders placed or performed during the hospital encounter of 11/25/14  . EKG 12-Lead  . EKG 12-Lead    ASSESSMENT AND PLAN:   #1 nausea and vomiting with possible blood-tinged vomitus - UDS is positive for cannabinoids, TCAs and benzos which may contribute to vomiting - Continue PPI - Hemoglobin blood pressure are stable - Continue supportive care  #2 MSSA pneumonia - Currently on Zosyn, transition to Augmentin when able to take by mouth - Respiratory status is normal, saturating well  #3 bipolar disorder - Psychiatric evaluation is pending  #4 hypothyroidism - Continue levothyroxine  CODE STATUS: Full  TOTAL TIME TAKING CARE OF THIS PATIENT: 30 minutes.  Greater than 50% of time spent in care coordination and counseling. POSSIBLE D/C IN 1 DAYS, DEPENDING ON CLINICAL CONDITION.   Elby Showers M.D on 11/28/2014 at 12:10 PM  Between 7am to 6pm - Pager - [phone]  After 6pm go to [email or web address] - password [email or web address] [facility]  [facility]  Office  [phone]  CC: Primary care physician; Derwood Kaplan, MD

## 2014-11-28 NOTE — Progress Notes (Signed)
SCDS AND TED HOSE REFUSED . DR Melvyn Novas. THESE ITEMS DISCONTINUED

## 2014-11-29 LAB — BASIC METABOLIC PANEL
ANION GAP: 8 (ref 5–15)
BUN: 17 mg/dL (ref 6–20)
CALCIUM: 8.5 mg/dL — AB (ref 8.9–10.3)
CO2: 26 mmol/L (ref 22–32)
Chloride: 106 mmol/L (ref 101–111)
Creatinine, Ser: 0.83 mg/dL (ref 0.61–1.24)
GFR calc Af Amer: >60 mL/min (ref >60)
GFR calc non Af Amer: >60 mL/min (ref >60)
GLUCOSE: 82 mg/dL (ref 65–99)
Potassium: 3.3 mmol/L — ABNORMAL LOW (ref 3.5–5.1)
Sodium: 140 mmol/L (ref 135–145)

## 2014-11-29 LAB — CBC
HEMATOCRIT: 42.4 % (ref 40.0–52.0)
Hemoglobin: 13.9 g/dL (ref 13.0–18.0)
MCH: 29.1 pg (ref 26.0–34.0)
MCHC: 32.9 g/dL (ref 32.0–36.0)
MCV: 88.5 fL (ref 80.0–100.0)
Platelets: 273 10*3/uL (ref 150–440)
RBC: 4.79 MIL/uL (ref 4.40–5.90)
RDW: 13.8 % (ref 11.5–14.5)
WBC: 9 10*3/uL (ref 3.8–10.6)

## 2014-11-29 MED ORDER — POTASSIUM CHLORIDE CRYS ER 20 MEQ PO TBCR
40.0000 meq | EXTENDED_RELEASE_TABLET | Freq: Once | ORAL | Status: AC
  Administered 2014-11-29: 40 meq via ORAL
  Filled 2014-11-29: qty 2

## 2014-11-29 MED ORDER — ONDANSETRON 4 MG PO TBDP
4.0000 mg | ORAL_TABLET | Freq: Three times a day (TID) | ORAL | Status: DC | PRN
Start: 2014-11-29 — End: 2014-11-29

## 2014-11-29 MED ORDER — ONDANSETRON 4 MG PO TBDP
4.0000 mg | ORAL_TABLET | Freq: Three times a day (TID) | ORAL | Status: DC | PRN
Start: 2014-11-29 — End: 2018-04-16

## 2014-11-29 MED ORDER — AMOXICILLIN-POT CLAVULANATE 875-125 MG PO TABS: 1.0000 | ORAL_TABLET | Freq: Two times a day (BID) | ORAL | Status: DC

## 2014-11-29 NOTE — Progress Notes (Signed)
Completed discharge instructions with patient.  No complaints other than patient thinks that he is being discharged too early.  Advised that if doctor didn't think he could be appropriate on po antibiotics then he wouldn't be discharged.

## 2014-11-29 NOTE — Progress Notes (Signed)
Advised patient has discharge orders.  He requested something for nausea/vomiting suppository.  Paged Dr. Clent Ridges and she stated that she would prescribe Zofran sublingual but not phenergan.  Printed and added to discharge paperwork.  Patients father is coming to pick patient up.

## 2014-11-29 NOTE — Progress Notes (Signed)
Patient found to have disconnected IV fluids from his peripheral iv and left machine running with fluids dripping to floor.  Advised patient that if he felt he needed to disconnect from his IV then he needs to call out, as if its an antibiotic then it should continue to run and he should not disconnect and leave hanging as fluids could cause a tripping hazard.  Patient acknowledged.  Night shift reported the same problem.  He does not follow instructions to not discontinue and knows how to unlock pump.

## 2014-11-29 NOTE — Progress Notes (Signed)
Patient very cooperative this shift.  Complains of being very tired and just wants to rest.  Hourly checks show patient resting comfortably in bed.  Advised if he leaves unit he must sign out, even to go to vending machine.  Patient acknowledges.

## 2014-11-29 NOTE — Discharge Instructions (Signed)
°  DIET:  °Regular diet ° °DISCHARGE CONDITION:  °Fair ° °ACTIVITY:  °Activity as tolerated ° °OXYGEN:  °Home Oxygen: No. °  °Oxygen Delivery: room air ° °DISCHARGE LOCATION:  °home  ° °If you experience worsening of your admission symptoms, develop shortness of breath, life threatening emergency, suicidal or homicidal thoughts you must seek medical attention immediately by calling 911 or calling your MD immediately  if symptoms less severe. ° °You Must read complete instructions/literature along with all the possible adverse reactions/side effects for all the Medicines you take and that have been prescribed to you. Take any new Medicines after you have completely understood and accpet all the possible adverse reactions/side effects.  ° °Please note ° °You were cared for by a hospitalist during your hospital stay. If you have any questions about your discharge medications or the care you received while you were in the hospital after you are discharged, you can call the unit and asked to speak with the hospitalist on call if the hospitalist that took care of you is not available. Once you are discharged, your primary care physician will handle any further medical issues. Please note that NO REFILLS for any discharge medications will be authorized once you are discharged, as it is imperative that you return to your primary care physician (or establish a relationship with a primary care physician if you do not have one) for your aftercare needs so that they can reassess your need for medications and monitor your lab values. ° ° °

## 2014-12-01 LAB — CULTURE, BLOOD (ROUTINE X 2)
CULTURE: NO GROWTH
Culture: NO GROWTH
Special Requests: NORMAL
Special Requests: NORMAL

## 2014-12-02 NOTE — Discharge Summary (Signed)
[facility] - [place] at [facility]  DISCHARGE SUMMARY   PATIENT NAME: [name]    MR#:  [id number]  DATE OF BIRTH:  08/01/1990  DATE OF ADMISSION:  11/27/2014 ADMITTING PHYSICIAN: Alford Highland, MD  DATE OF DISCHARGE: 04/30/2014  PRIMARY CARE PHYSICIAN: Derwood Kaplan, MD    ADMISSION DIAGNOSIS:  Sepsis due to Methicillin susceptible Staphylococcus aureus [A41.01]  DISCHARGE DIAGNOSIS:  Principal Problem:   Bipolar 1 disorder Active Problems:   Aspiration pneumonia   Vomiting   SECONDARY DIAGNOSIS:   Past Medical History  Diagnosis Date  . Thyroid disease   . Depression   . Suicide attempt   . Bipolar 1 disorder   . PTSD (post-traumatic stress disorder)   . Hypothyroidism     HOSPITAL COURSE:    #1 nausea and vomiting with possible blood-tinged vomitus - UDS is positive for cannabinoids, TCAs and benzos which may have contributed to vomiting - Symptoms resolved soon after admission at the time of discharge she is tolerating clear fluids, medications and some solids.  #2 MSSA pneumonia - Treated with Zosyn while inpatient. Transitioned to Augmentin upon discharge. - Respiratory status is normal, saturating well  #3 bipolar disorder - Psychiatric evaluation by Dr. Toni Amend. Patient does not need inpatient treatment at this time.  #4 hypothyroidism - Continue levothyroxine   DISCHARGE CONDITIONS:   Stable  CONSULTS OBTAINED:  Treatment Team:  Audery Amel, MD  DRUG ALLERGIES:   Allergies  Allergen Reactions  . Sulfa Antibiotics Anaphylaxis    DISCHARGE MEDICATIONS:   Discharge Medication List as of 11/29/2014  3:15 PM    CONTINUE these medications which have CHANGED   Details  amoxicillin-clavulanate (AUGMENTIN) 875-125 MG per tablet Take 1 tablet by mouth every 12 (twelve) hours., Starting 11/29/2014, Until Discontinued, Print    ondansetron (ZOFRAN ODT) 4 MG disintegrating tablet Take 1 tablet (4 mg total)  by mouth every 8 (eight) hours as needed for nausea or vomiting., Starting 11/29/2014, Until Discontinued, Print      CONTINUE these medications which have NOT CHANGED   Details  citalopram (CELEXA) 40 MG tablet Take 40 mg by mouth daily., Until Discontinued, Historical Med    esomeprazole (NEXIUM) 40 MG capsule Take 40 mg by mouth at bedtime. , Until Discontinued, Historical Med    lamoTRIgine (LAMICTAL) 150 MG tablet Take 150 mg by mouth 2 (two) times daily., Until Discontinued, Historical Med    levothyroxine (SYNTHROID, LEVOTHROID) 75 MCG tablet Take 75 mcg by mouth daily before breakfast., Until Discontinued, Historical Med    rizatriptan (MAXALT) 10 MG tablet Take 10 mg by mouth as needed for migraine. May repeat in 2 hours if needed, Until Discontinued, Historical Med    QUEtiapine (SEROQUEL) 100 MG tablet Take one tab in am and two tabs in the PM, Print         DISCHARGE INSTRUCTIONS:    Discharged in stable condition. No home health needs. Heart healthy diet. No supplemental oxygen.  If you experience worsening of your admission symptoms, develop shortness of breath, life threatening emergency, suicidal or homicidal thoughts you must seek medical attention immediately by calling 911 or calling your MD immediately  if symptoms less severe.  You Must read complete instructions/literature along with all the possible adverse reactions/side effects for all the Medicines you take and that have been prescribed to you. Take any new Medicines after you have completely understood and accept all the possible adverse reactions/side effects.   Please note  You were cared for by a hospitalist during your hospital stay. If you have any questions about your discharge medications or the care you received while you were in the hospital after you are discharged, you can call the unit and asked to speak with the hospitalist on call if the hospitalist that took care of you is not available. Once  you are discharged, your primary care physician will handle any further medical issues. Please note that NO REFILLS for any discharge medications will be authorized once you are discharged, as it is imperative that you return to your primary care physician (or establish a relationship with a primary care physician if you do not have one) for your aftercare needs so that they can reassess your need for medications and monitor your lab values.    Today   CHIEF COMPLAINT:   Chief Complaint  Patient presents with  . Emesis  . Cough    HISTORY OF PRESENT ILLNESS:  [name] is a 24 y.o. male with a known history of recent hospitalization with likely overdose and found to have an MSSA pneumonia. He was doing better with regards to his pneumonia. He had a prolonged hospital course requiring 2 intubations. And his respiratory status had improved her breathing on room air. He was seen by Dr. Guss Bunde psychiatry over the weekend who recommended a follow-up psychiatry evaluation but she discontinued the involuntary commitment and the patient signed out AGAINST MEDICAL ADVICE. He came back the next day because of nausea and vomiting and then signed out AGAINST MEDICAL ADVICE again. Now he comes back the third day in a row. He states that he has had nausea vomiting and is unable to keep any food or medication down. He states that he has vomited up some blood tinged material. He states that he is unable to take his psychiatric medication. He is having some epigastric abdominal pain.  VITAL SIGNS:  Blood pressure 133/82, pulse 109, temperature 98.6 F (37 C), temperature source Oral, resp. rate 20, height  (1.702 m), weight 149.687 kg (330 lb), SpO2 99 %.  I/O:  No intake or output data in the 24 hours ending 12/02/14 1628  PHYSICAL EXAMINATION:  GENERAL:  24 y.o.-year-old patient lying in the bed with no acute distress.  EYES: Pupils equal, round, reactive to light and accommodation. No scleral  icterus. Extraocular muscles intact.  HEENT: Head atraumatic, normocephalic. Oropharynx and nasopharynx clear.  NECK:  Supple, no jugular venous distention. No thyroid enlargement, no tenderness.  LUNGS: Normal breath sounds bilaterally, no wheezing, rales,rhonchi or crepitation. No use of accessory muscles of respiration.  CARDIOVASCULAR: S1, S2 normal. No murmurs, rubs, or gallops.  ABDOMEN: Soft, non-tender, non-distended. Bowel sounds present. No organomegaly or mass.  EXTREMITIES: No pedal edema, cyanosis, or clubbing.  NEUROLOGIC: Cranial nerves II through XII are intact. Muscle strength 5/5 in all extremities. Sensation intact. Gait not checked.  PSYCHIATRIC: The patient is alert and oriented x 3.  SKIN: No obvious rash, lesion, or ulcer.   DATA REVIEW:   CBC  Recent Labs Lab 11/29/14 0437  WBC 9.0  HGB 13.9  HCT 42.4  PLT 273    Chemistries   Recent Labs Lab 11/27/14 1318  11/29/14 0437  NA 139  < > 140  K 4.4  < > 3.3*  CL 106  < > 106  CO2 20*  < > 26  GLUCOSE 122*  < > 82  BUN 20  < > 17  CREATININE 0.67  < >  0.83  CALCIUM 9.1  < > 8.5*  AST 41  --   --   ALT 92*  --   --   ALKPHOS 75  --   --   BILITOT 1.4*  --   --   < > = values in this interval not displayed.  Cardiac Enzymes  Recent Labs Lab 11/26/14 0005  TROPONINI <0.03    Microbiology Results  Results for orders placed or performed during the hospital encounter of 11/25/14  Blood culture (routine x 2)     Status: None   Collection Time: 11/26/14 12:03 AM  Result Value Ref Range Status   Specimen Description BLOOD  Final   Special Requests Normal  Final   Culture NO GROWTH 5 DAYS  Final   Report Status 12/01/2014 FINAL  Final  Blood culture (routine x 2)     Status: None   Collection Time: 11/26/14 12:03 AM  Result Value Ref Range Status   Specimen Description BLOOD  Final   Special Requests Normal  Final   Culture NO GROWTH 5 DAYS  Final   Report Status 12/01/2014 FINAL  Final     RADIOLOGY:  No results found.  EKG:   Orders placed or performed during the hospital encounter of 11/25/14  . EKG 12-Lead  . EKG 12-Lead  . EKG      Management plans discussed with the patient, family and they are in agreement.  CODE STATUS: Full  TOTAL TIME TAKING CARE OF THIS PATIENT: 35 minutes.  Greater than 50% of time spent in care coordination and counseling.  Elby Showers M.D on 12/02/2014 at 4:28 PM  Between 7am to 6pm - Pager - [phone]  After 6pm go to [email or web address] - password [email or web address] [facility]  [facility]  Office  [phone]  CC: Primary care physician; Derwood Kaplan, MD

## 2014-12-10 NOTE — Discharge Summary (Signed)
Patient was admitted on 11/26/2014 with shortness of breath following emesis, signed AMA and left the hospital before he was evaluated by the rounding physician in the morning.

## 2015-10-16 IMAGING — CR DG CHEST 1V PORT
1 series · 1 of 1 positions shown · non-contrast
Comparison: 06/30/2009

CLINICAL DATA: Post intubation

EXAM:
PORTABLE CHEST - 1 VIEW

[portable]
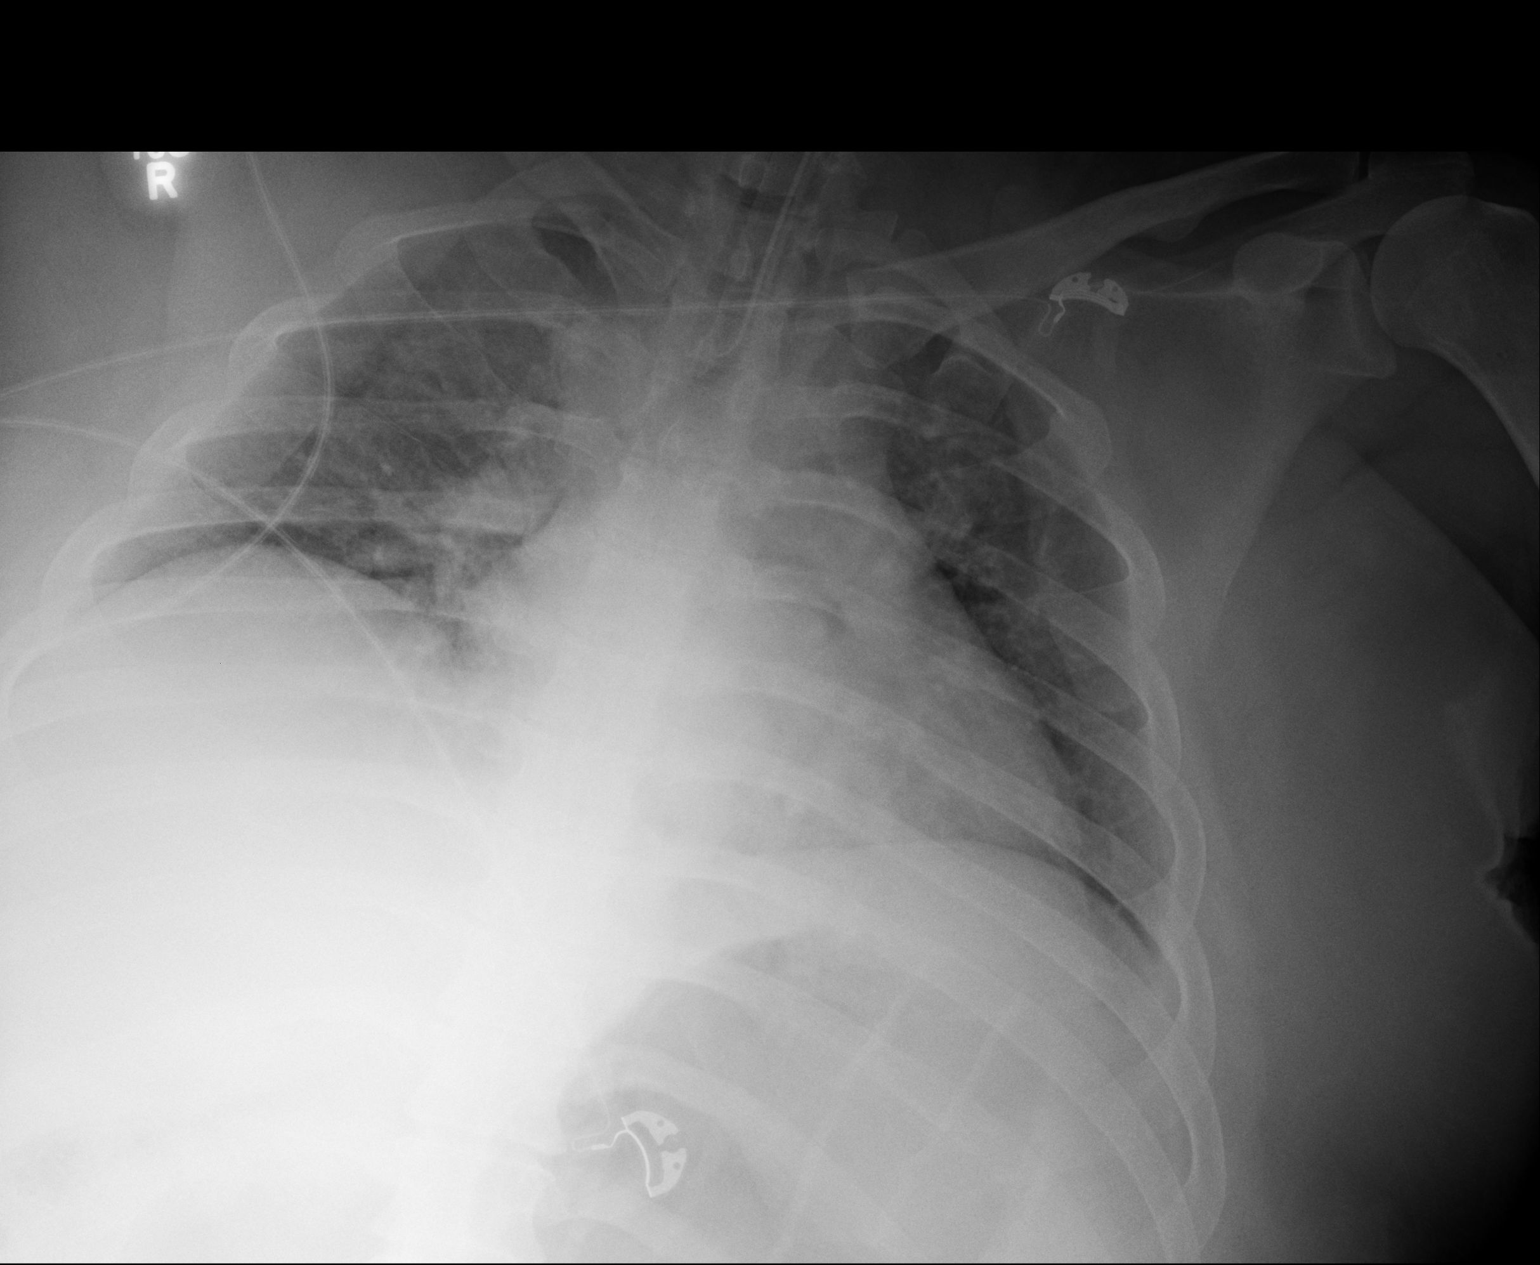

[1 of 1 positions shown; findings below may reference images not displayed]

FINDINGS: Interval placement of an endotracheal tube with tip measuring 3.2 cm
above the carina. Shallow inspiration. Heart size and pulmonary
vascularity are normal for technique. No focal airspace disease or
consolidation in the lungs. No blunting of costophrenic angles. No
pneumothorax.
IMPRESSION: Endotracheal tube tip measures 3.2 cm above the carina. Shallow
inspiration. No evidence of active pulmonary disease.

## 2015-10-16 IMAGING — CT CT HEAD W/O CM
4 series · 19 of 30 positions shown, 20 images · non-contrast
Comparison: 04/19/2013

CLINICAL DATA: History drug overdose. Unresponsive. Found down on
floor. Difficult to arouse.

EXAM:
CT HEAD WITHOUT CONTRAST
TECHNIQUE: Contiguous axial images were obtained from the base of the skull
through the vertex without intravenous contrast.

[Series 2: head bone · axial · 0.41mm/px · z∈[-100,+40]mm · 8 of 86 slices shown]
[im 8/86  bone]
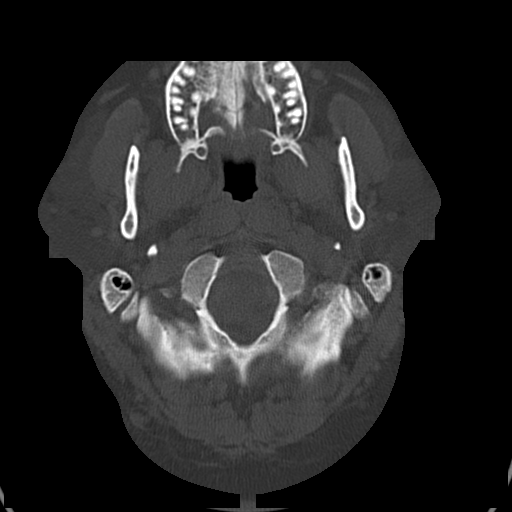
[im 16/86  bone]
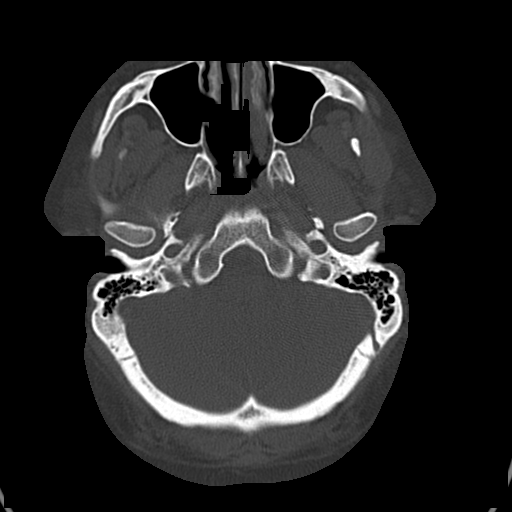
[im 31/86  bone]
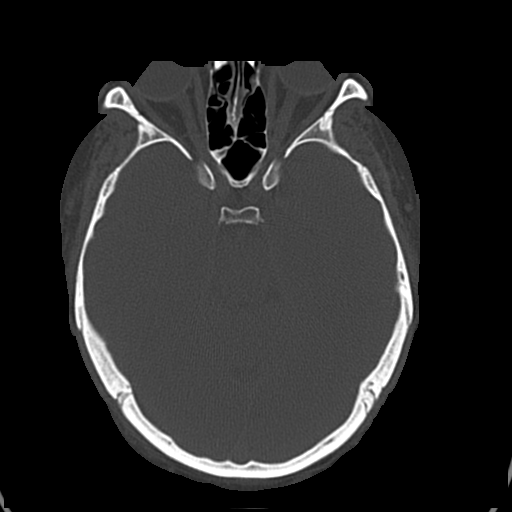
[im 39/86  bone]
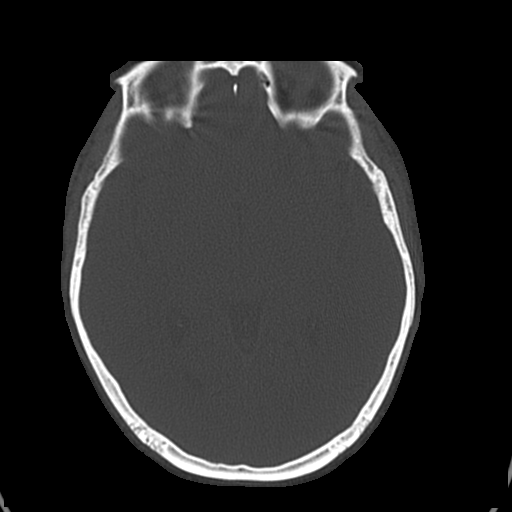
[im 47/86  bone]
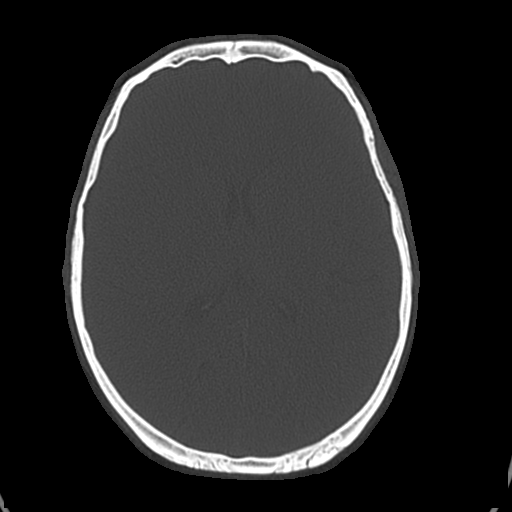
[im 55/86  bone]
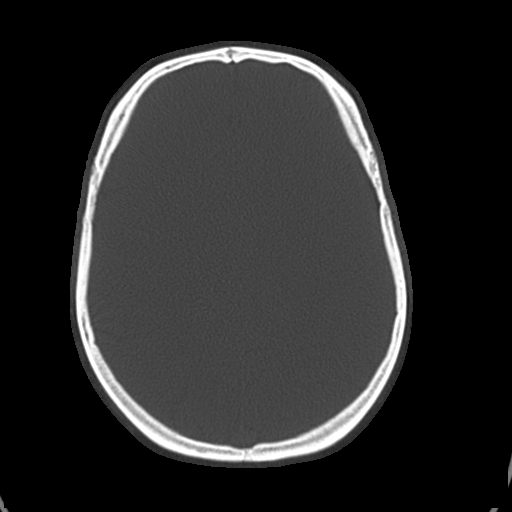
[im 70/86  bone]
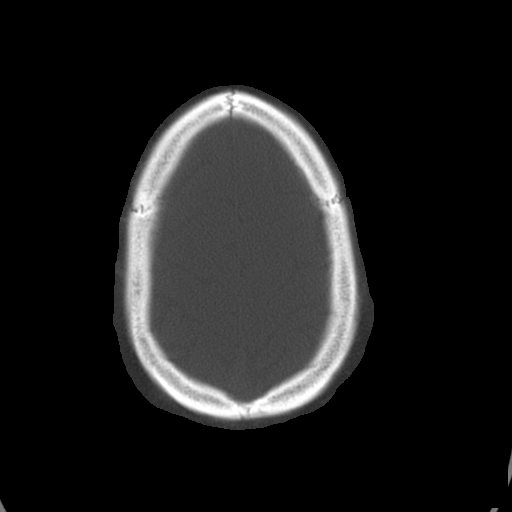
[im 78/86  bone]
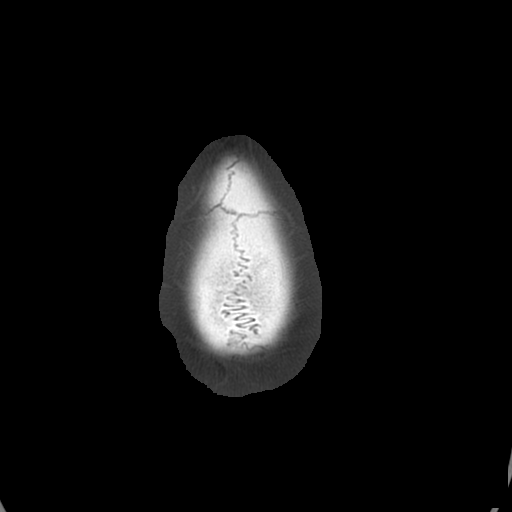

[Series 3: head wo · axial · 0.41mm/px · z∈[-54,-4]mm · 2 of 31 slices shown, 3 images]
[im 11/31  brain]
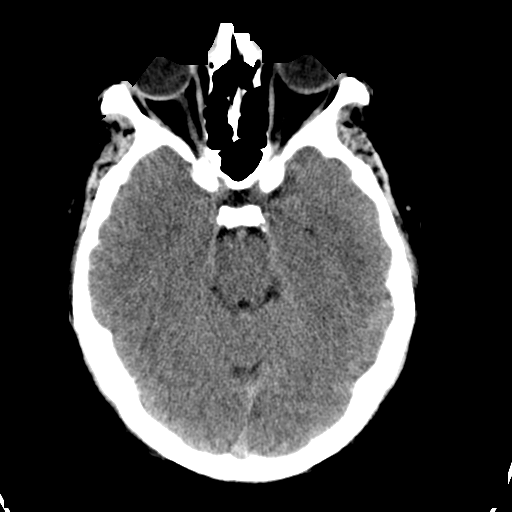
[im 11/31  bone]
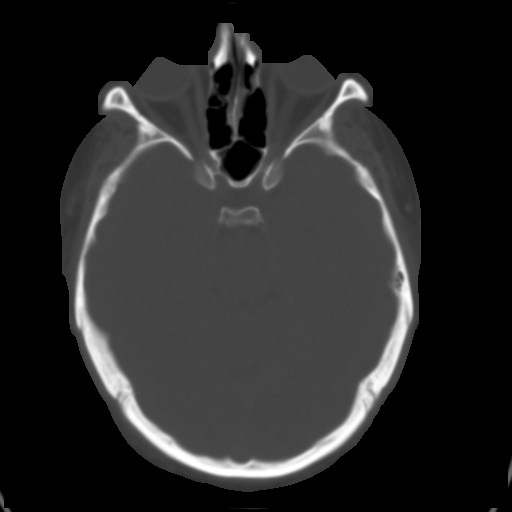
[im 21/31  brain]
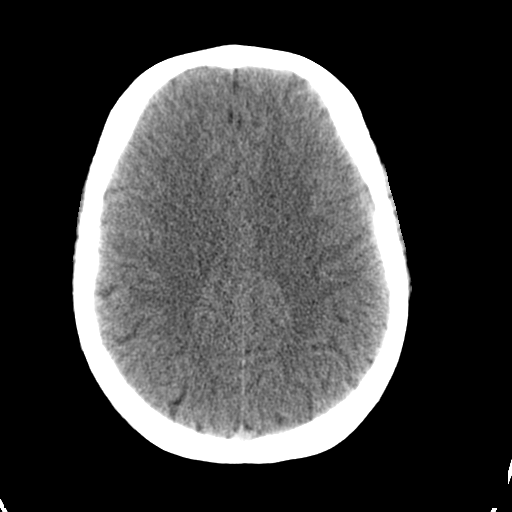

[Series 4: head wo recons · axial · 0.41mm/px · z∈[+12,+55]mm · 2 of 30 slices shown]
[im 10/30  brain]
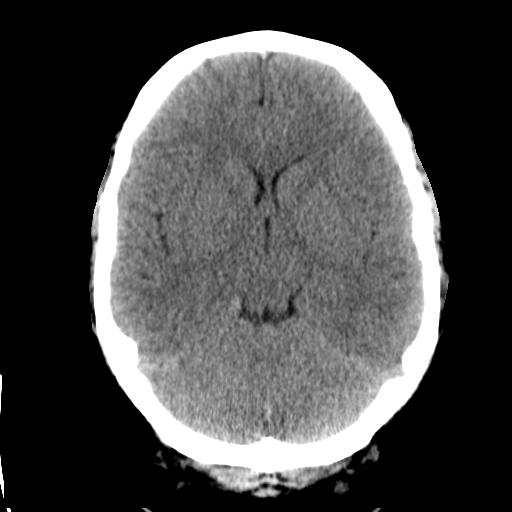
[im 20/30  brain]
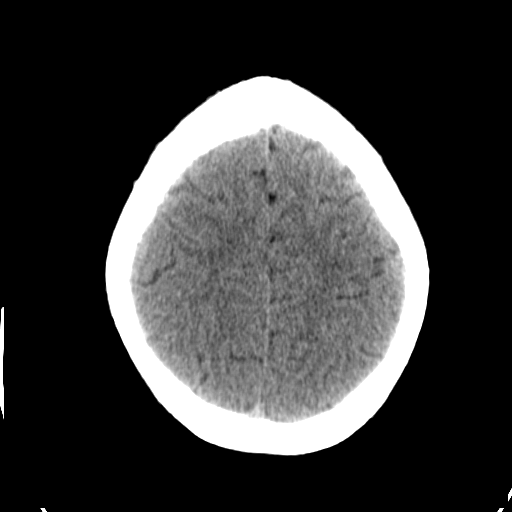

[Series 5: head bone recons · axial · 0.41mm/px · z∈[-11,+74]mm · 7 of 74 slices shown]
[im 9/74  bone]
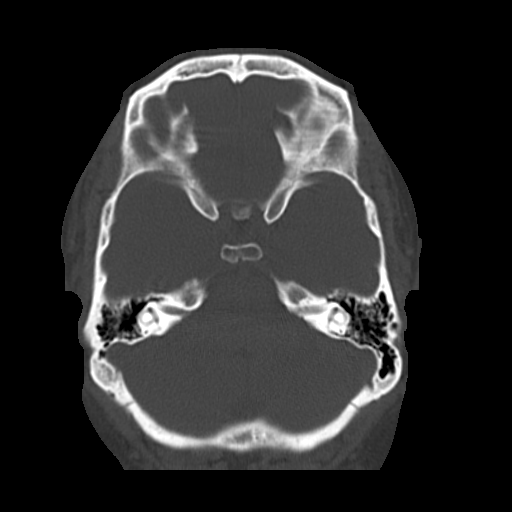
[im 17/74  bone]
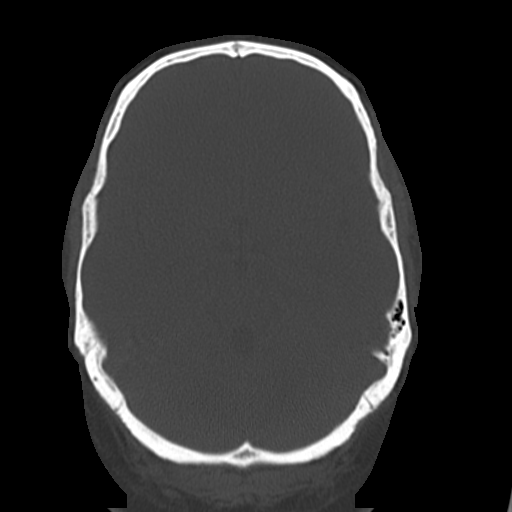
[im 25/74  bone]
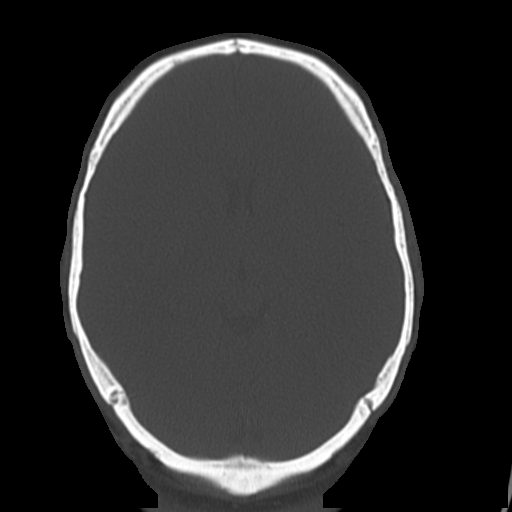
[im 33/74  bone]
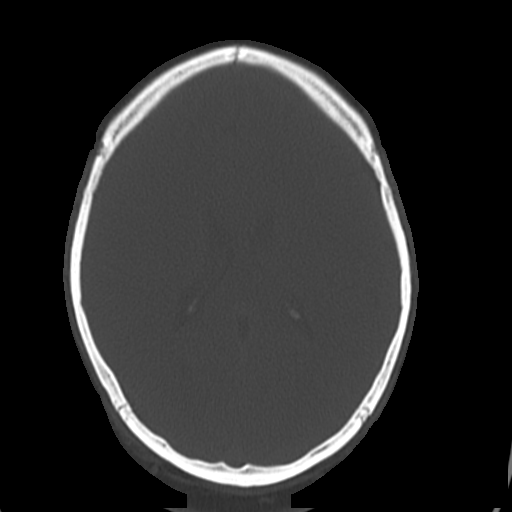
[im 41/74  bone]
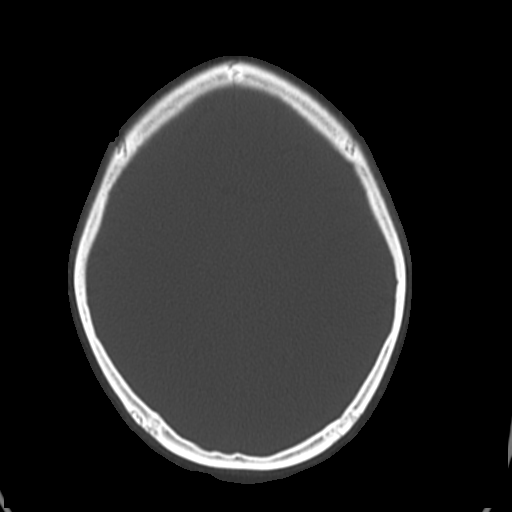
[im 49/74  bone]
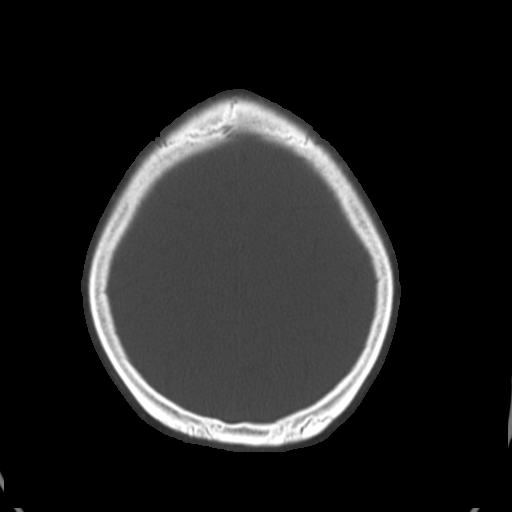
[im 57/74  bone]
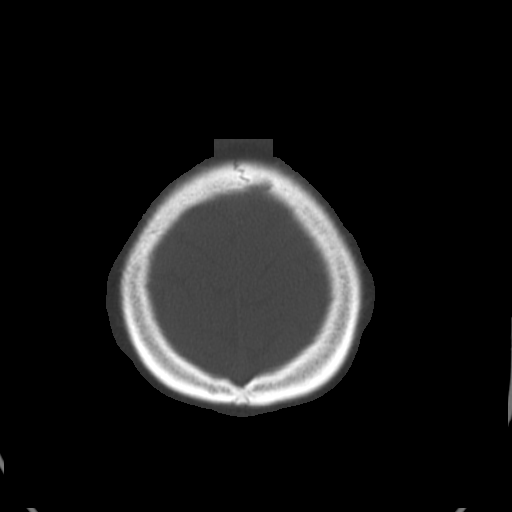

[19 of 30 positions shown; findings below may reference images not displayed]

FINDINGS: Ventricles and sulci appear symmetrical. No mass effect or midline
shift. No abnormal extra-axial fluid collections. Gray-white matter
junctions are distinct. Basal cisterns are not effaced. No evidence
of acute intracranial hemorrhage. No depressed skull fractures.
Visualized paranasal sinuses and mastoid air cells are not
opacified.
IMPRESSION: No acute intracranial abnormalities.

## 2015-10-18 IMAGING — CR DG CHEST 1V PORT
1 series · 1 of 1 positions shown · non-contrast
Comparison: 11/13/2014.

CLINICAL DATA: Central line NG tube placement.

EXAM:
PORTABLE CHEST - 1 VIEW

[ap]
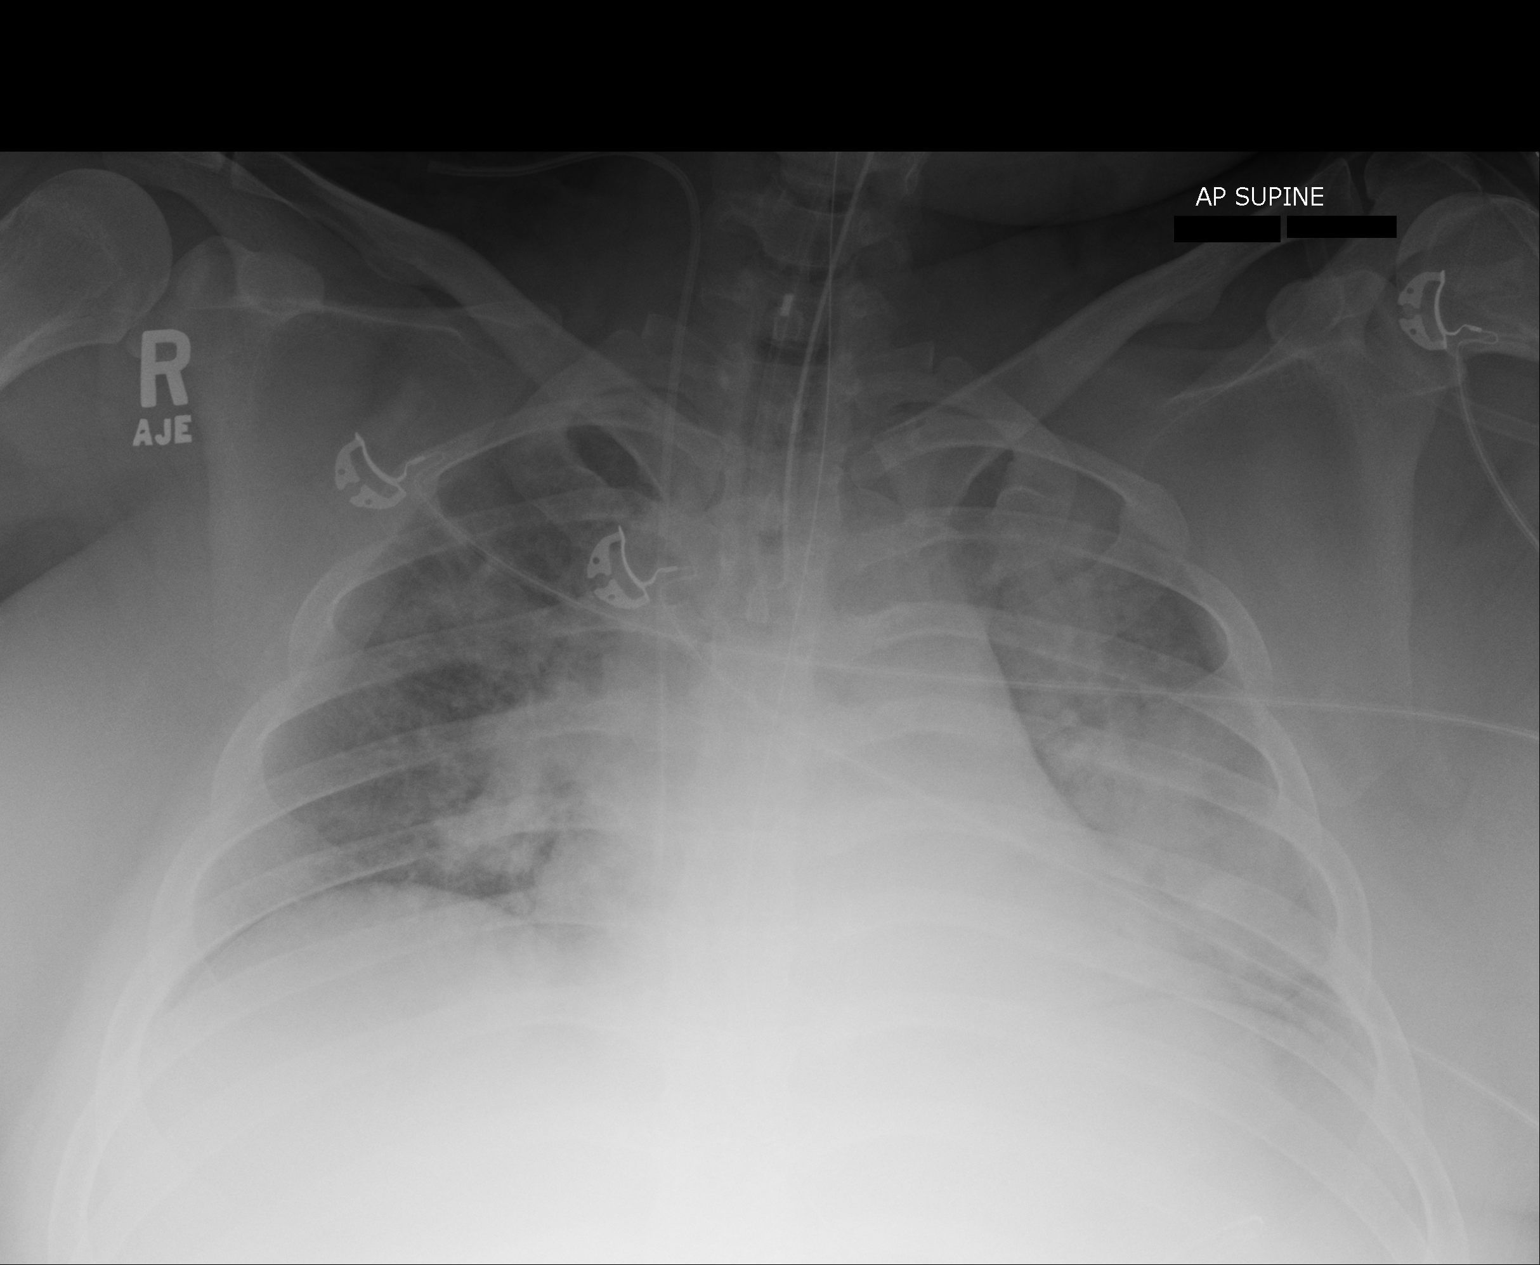

[1 of 1 positions shown; findings below may reference images not displayed]

FINDINGS: Interim placement of right IJ line, its tip is in the upper portion
the right atrium. Interim placement NG tube, its tip is below left
hemidiaphragm. Endotracheal tube tip 12 mm above the carina .
Cardiomegaly. Bilateral pulmonary alveolar infiltrates, left side
greater right. Findings suggest congestive heart failure pulmonary
edema. Bilateral pneumonia cannot be excluded. No pleural effusion
or pneumothorax .
IMPRESSION: 1. Interim placement of right IJ line, its tip is projected over the
upper right atrium. NG tube placement with tip below left
hemidiaphragm. Endotracheal tube tip 12 mm above the carina.
2. Cardiomegaly with bilateral pulmonary infiltrates left side
greater right. Findings consistent congestive heart failure
pulmonary edema. Bilateral pneumonia cannot be excluded.

## 2015-10-18 IMAGING — CR DG ABD PORTABLE 1V
1 series · 1 of 1 positions shown · non-contrast
Comparison: CT abdomen 09/24/2014.  Chest x-ray 11/13/2014

CLINICAL DATA: Gastric tube placement

EXAM:
PORTABLE ABDOMEN - 1 VIEW

[supine ap]
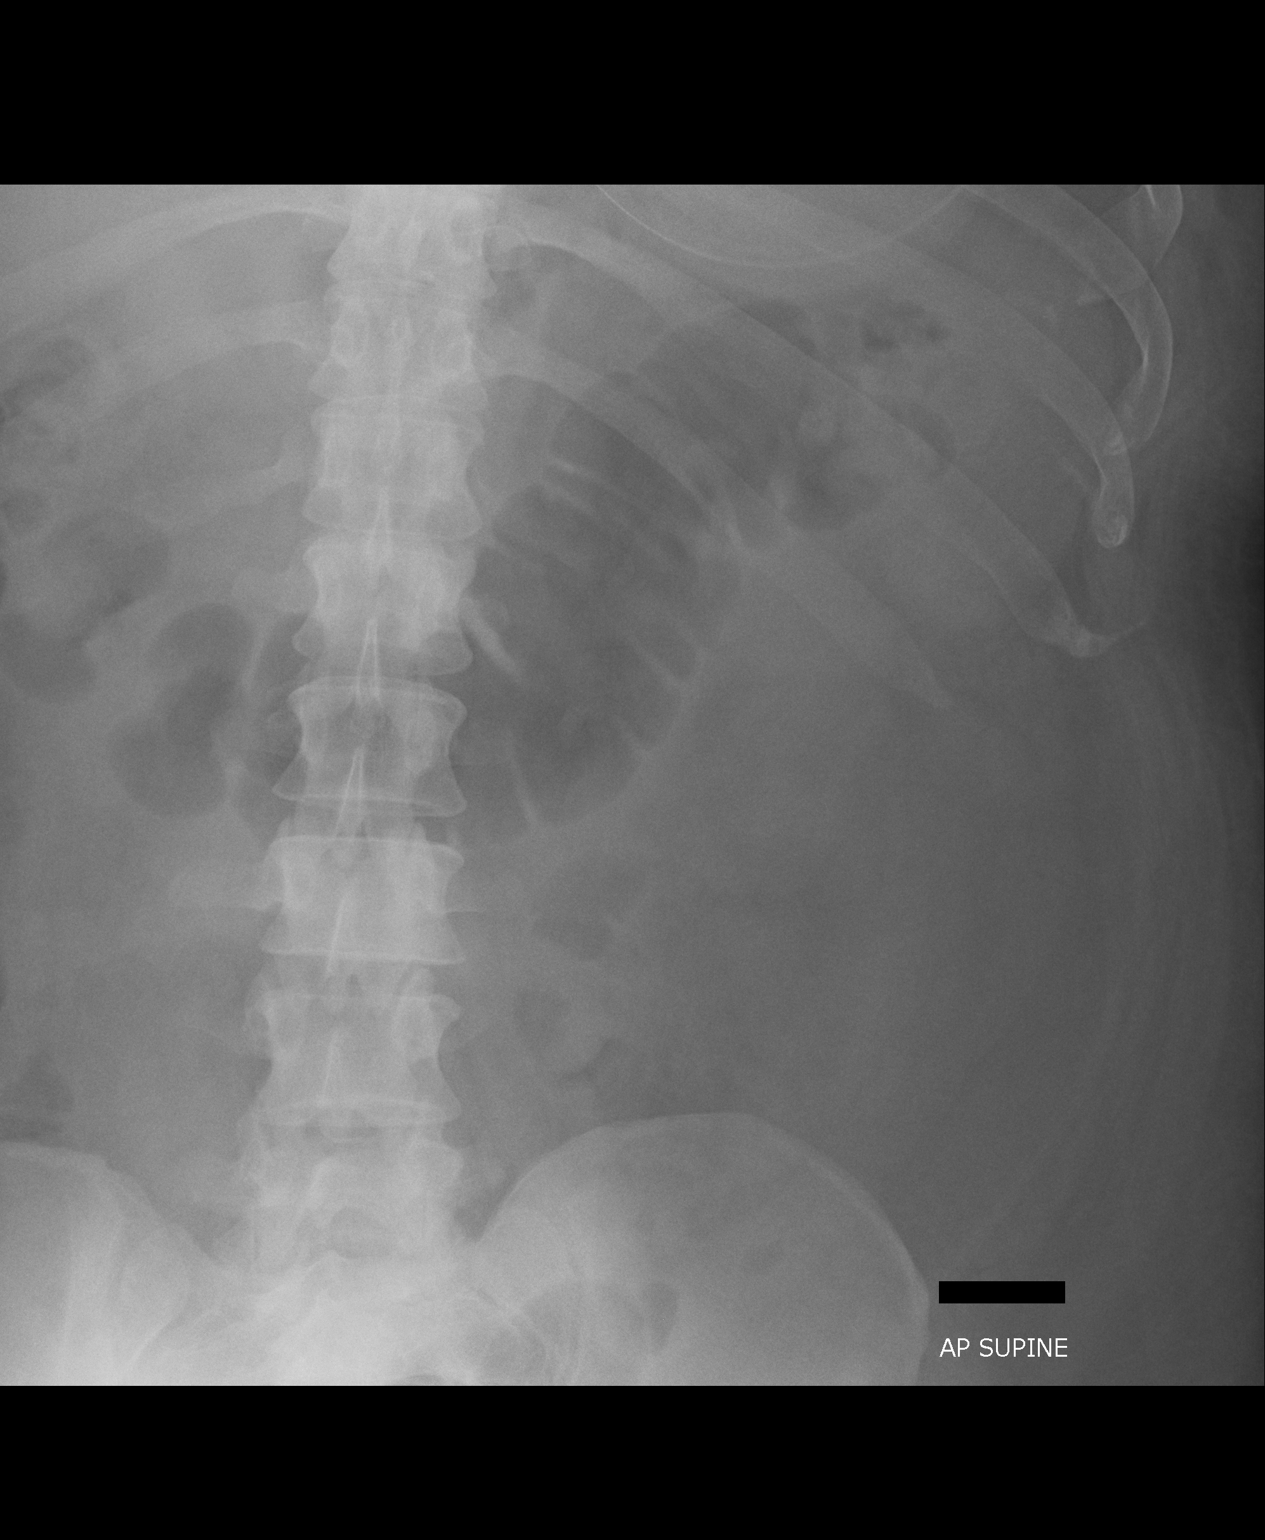

[1 of 1 positions shown; findings below may reference images not displayed]

FINDINGS: Gastric tube is coiled in the stomach. Gas is present in the colon
which is nondilated. Small bowel nondilated. Normal bowel gas
pattern. No renal calculi.
IMPRESSION: Gastric tube coiled in the stomach.  Normal bowel gas pattern.

## 2016-02-18 ENCOUNTER — Emergency Department
Admission: EM | Admit: 2016-02-18 | Discharge: 2016-02-18 | Disposition: A | Discharge status: Home / Self Care | Payer: Federal, State, Local not specified - PPO | Attending: Emergency Medicine | Admitting: Emergency Medicine

## 2016-02-18 ENCOUNTER — Encounter: Payer: Self-pay | Admitting: Emergency Medicine

## 2016-02-18 ENCOUNTER — Emergency Department: Payer: Federal, State, Local not specified - PPO

## 2016-02-18 DIAGNOSIS — K5641 Fecal impaction: Secondary | ICD-10-CM

## 2016-02-18 DIAGNOSIS — F17210 Nicotine dependence, cigarettes, uncomplicated: Secondary | ICD-10-CM | POA: Diagnosis not present

## 2016-02-18 DIAGNOSIS — K5900 Constipation, unspecified: Secondary | ICD-10-CM

## 2016-02-18 DIAGNOSIS — Z79899 Other long term (current) drug therapy: Secondary | ICD-10-CM | POA: Insufficient documentation

## 2016-02-18 DIAGNOSIS — E039 Hypothyroidism, unspecified: Secondary | ICD-10-CM | POA: Diagnosis not present

## 2016-02-18 LAB — BASIC METABOLIC PANEL
Anion gap: 10 (ref 5–15)
BUN: 21 mg/dL — AB (ref 6–20)
CALCIUM: 9.1 mg/dL (ref 8.9–10.3)
CHLORIDE: 103 mmol/L (ref 101–111)
CO2: 24 mmol/L (ref 22–32)
CREATININE: 0.94 mg/dL (ref 0.61–1.24)
GFR calc non Af Amer: >60 mL/min (ref >60)
Glucose, Bld: 148 mg/dL — ABNORMAL HIGH (ref 65–99)
Potassium: 3.9 mmol/L (ref 3.5–5.1)
Sodium: 137 mmol/L (ref 135–145)

## 2016-02-18 LAB — CBC
HCT: 50.5 % (ref 40.0–52.0)
Hemoglobin: 17.3 g/dL (ref 13.0–18.0)
MCH: 30.3 pg (ref 26.0–34.0)
MCHC: 34.3 g/dL (ref 32.0–36.0)
MCV: 88.3 fL (ref 80.0–100.0)
PLATELETS: 269 10*3/uL (ref 150–440)
RBC: 5.72 MIL/uL (ref 4.40–5.90)
RDW: 14.1 % (ref 11.5–14.5)
WBC: 10.8 10*3/uL — ABNORMAL HIGH (ref 3.8–10.6)

## 2016-02-18 LAB — URINALYSIS, COMPLETE (UACMP) WITH MICROSCOPIC
BACTERIA UA: NONE SEEN
Bilirubin Urine: NEGATIVE
Glucose, UA: 50 mg/dL — AB
HGB URINE DIPSTICK: NEGATIVE
Ketones, ur: NEGATIVE mg/dL
Leukocytes, UA: NEGATIVE
Nitrite: NEGATIVE
PROTEIN: 30 mg/dL — AB
SPECIFIC GRAVITY, URINE: 1.024 (ref 1.005–1.030)
SQUAMOUS EPITHELIAL / LPF: NONE SEEN
pH: 5 (ref 5.0–8.0)

## 2016-02-18 LAB — TROPONIN I

## 2016-02-18 LAB — LIPASE, BLOOD: Lipase: 18 U/L (ref 11–51)

## 2016-02-18 MED ORDER — POLYETHYLENE GLYCOL 3350 17 GM/SCOOP PO POWD: ORAL | 0 refills | Status: DC

## 2016-02-18 MED ORDER — SENNA 8.6 MG PO TABS: 2.0000 | ORAL_TABLET | Freq: Two times a day (BID) | ORAL | 0 refills | Status: DC

## 2016-02-18 MED ORDER — DOCUSATE SODIUM 100 MG PO CAPS: 200.0000 mg | ORAL_CAPSULE | Freq: Two times a day (BID) | ORAL | 0 refills | Status: DC

## 2016-02-18 NOTE — ED Provider Notes (Signed)
[facility] Emergency Department Provider Note  ____________________________________________  Time seen: Approximately 10:23 PM  I have reviewed the triage vital signs and the nursing notes.   HISTORY  Chief Complaint Constipation and Chest Pain    HPI [name] is a 25 y.o. male who complains of constipation for the past 4 days. Contrary to the triage note, the patient denies chest pain shortness of breath nausea or vomiting. Reports trying enemas and magnesium citrate at home without relief. He reports that he has hypothyroidism and has been compliant with his Synthroid.  Has a history of heroin abuse, most recently 3 months ago. No drug use at all since using marijuana 2 months ago. No fever or chills. No abdominal surgeries before.  Back when he used to use heroin regularly, he reports that he had recurrent issues with constipation.   Past Medical History:  Diagnosis Date  . Bipolar 1 disorder (HCC)   . Depression   . Hypothyroidism   . PTSD (post-traumatic stress disorder)   . Suicide attempt   . Thyroid disease      Patient Active Problem List   Diagnosis Date Noted  . Vomiting 11/27/2014  . Dyspnea 11/26/2014  . Leucocytosis 11/26/2014  . Autism 11/16/2014  . Bipolar 1 disorder (HCC) 11/16/2014  . ARDS (adult respiratory distress syndrome) (HCC) 11/16/2014  . Severe sepsis with acute organ dysfunction (HCC) 11/16/2014  . Aspiration pneumonia (HCC) 11/15/2014  . Overdose   . Acute respiratory failure with hypoxia (HCC) 11/13/2014     Past Surgical History:  Procedure Laterality Date  . APPENDECTOMY       Prior to Admission medications   Medication Sig Start Date End Date Taking? Authorizing Provider  amoxicillin-clavulanate (AUGMENTIN) 875-125 MG per tablet Take 1 tablet by mouth every 12 (twelve) hours. 11/29/14   Gale Journeyatherine P Walsh, MD  citalopram (CELEXA) 40 MG tablet Take 40 mg by mouth daily.    Historical Provider, MD   docusate sodium (COLACE) 100 MG capsule Take 2 capsules (200 mg total) by mouth 2 (two) times daily. 02/18/16   Sharman CheekPhillip Tian Mcmurtrey, MD  esomeprazole (NEXIUM) 40 MG capsule Take 40 mg by mouth at bedtime.     Historical Provider, MD  lamoTRIgine (LAMICTAL) 150 MG tablet Take 150 mg by mouth 2 (two) times daily.    Historical Provider, MD  levothyroxine (SYNTHROID, LEVOTHROID) 75 MCG tablet Take 75 mcg by mouth daily before breakfast.    Historical Provider, MD  ondansetron (ZOFRAN ODT) 4 MG disintegrating tablet Take 1 tablet (4 mg total) by mouth every 8 (eight) hours as needed for nausea or vomiting. 11/29/14   Gale Journeyatherine P Walsh, MD  polyethylene glycol powder (GLYCOLAX/MIRALAX) powder 2 cap fulls in a full glass of water, three times a day, for 5 days. 02/18/16   Sharman CheekPhillip Bobi Daudelin, MD  QUEtiapine (SEROQUEL) 100 MG tablet Take one tab in am and two tabs in the PM Patient not taking: Reported on 11/27/2014 11/25/14   Alford Highlandichard Wieting, MD  rizatriptan (MAXALT) 10 MG tablet Take 10 mg by mouth as needed for migraine. May repeat in 2 hours if needed    Historical Provider, MD  senna (SENOKOT) 8.6 MG TABS tablet Take 2 tablets (17.2 mg total) by mouth 2 (two) times daily. 02/18/16   Sharman CheekPhillip Priyah Schmuck, MD     Allergies Sulfa antibiotics   Family History  Problem Relation Age of Onset  . Breast cancer Mother     Social History Social History  Substance  Use Topics  . Smoking status: Current Some Day Smoker    Packs/day: 1.00    Types: Cigarettes  . Smokeless tobacco: Never Used  . Alcohol use No    Review of Systems  Constitutional:   No fever or chills.  ENT:   No sore throat. No rhinorrhea. Cardiovascular:   No chest pain. Respiratory:   No dyspnea or cough. Gastrointestinal:   Negative for abdominal pain, vomiting and diarrhea. Positive constipation Genitourinary:   Negative for dysuria or difficulty urinating. Musculoskeletal:   Negative for focal pain or swelling Neurological:    Negative for headaches 10-point ROS otherwise negative.  ____________________________________________   PHYSICAL EXAM:  VITAL SIGNS: ED Triage Vitals  Enc Vitals Group     BP 02/18/16 2042 113/80     Pulse Rate 02/18/16 2042 (!) 120     Resp 02/18/16 2042 20     Temp 02/18/16 2042 98 F (36.7 C)     Temp Source 02/18/16 2042 Oral     SpO2 02/18/16 2042 92 %     Weight 02/18/16 2042 280 lb (127 kg)     Height 02/18/16 2042 5\' 7"  (1.702 m)     Head Circumference --      Peak Flow --      Pain Score 02/18/16 2043 5     Pain Loc --      Pain Edu? --      Excl. in GC? --     Vital signs reviewed, nursing assessments reviewed.   Constitutional:   Alert and oriented. Well appearing and in no distress. Eyes:   No scleral icterus. No conjunctival pallor. PERRL. EOMI.  No nystagmus. ENT   Head:   Normocephalic and atraumatic.   Nose:   No congestion/rhinnorhea. No septal hematoma   Mouth/Throat:   MMM, no pharyngeal erythema. No peritonsillar mass.    Neck:   No stridor. No SubQ emphysema. No meningismus. Hematological/Lymphatic/Immunilogical:   No cervical lymphadenopathy. Cardiovascular:   RRR. Symmetric bilateral radial and DP pulses.  No murmurs.  Respiratory:   Normal respiratory effort without tachypnea nor retractions. Breath sounds are clear and equal bilaterally. No wheezes/rales/rhonchi. Gastrointestinal:   Soft and nontender. Non distended. There is no CVA tenderness.  No rebound, rigidity, or guarding. Rectal exam reveals impacted stool in the vault. Manually disimpacted. On further examination, patient is beginning to move more stool down into the lower rectum. Genitourinary:   deferred Musculoskeletal:   Nontender with normal range of motion in all extremities. No joint effusions.  No lower extremity tenderness.  No edema. Neurologic:   Normal speech and language.  CN 2-10 normal. Motor grossly intact. No gross focal neurologic deficits are appreciated.   Skin:    Skin is warm, dry and intact. No rash noted.  No petechiae, purpura, or bullae.  ____________________________________________    LABS (pertinent positives/negatives) (all labs ordered are listed, but only abnormal results are displayed) Labs Reviewed  BASIC METABOLIC PANEL - Abnormal; Notable for the following:       Result Value   Glucose, Bld 148 (*)    BUN 21 (*)    All other components within normal limits  CBC - Abnormal; Notable for the following:    WBC 10.8 (*)    All other components within normal limits  URINALYSIS, COMPLETE (UACMP) WITH MICROSCOPIC - Abnormal; Notable for the following:    Color, Urine YELLOW (*)    APPearance CLEAR (*)    Glucose, UA 50 (*)  Protein, ur 30 (*)    All other components within normal limits  TROPONIN I  LIPASE, BLOOD   ____________________________________________   EKG  Interpreted by me Sinus tachycardia rate 111, normal axis and intervals. Poor R-wave progression in anterior precordial leads. Normal ST segments and T waves.  ____________________________________________    RADIOLOGY  X-ray abdomen unremarkable Chest x-ray unremarkable  ____________________________________________   PROCEDURES Procedures ------------------------------------------------------------------------------------------------------------------- Fecal Disimpaction Procedure Note:  Performed by me:  Patient placed in the lateral recumbent position with knees drawn towards chest. Nurse present for patient support. Large amount of hard brown stool removed. No complications during procedure.   ------------------------------------------------------------------------------------------------------------------   ____________________________________________   INITIAL IMPRESSION / ASSESSMENT AND PLAN / ED COURSE  Pertinent labs & imaging results that were available during my care of the patient were reviewed by me and considered in my  medical decision making (see chart for details).  Patient presents with constipation and fecal impaction. Otherwise no acute symptoms, well-appearing no acute distress. Tachycardia triage does not appear to be significant, likely due to his discomfort and anxiety. Have the patient take senna Colace and high-dose MiraLAX, and after disimpaction and think he'll be able to move his bowels and feels much better. Follow-up with primary care. Low suspicion for small bowel obstruction. No evidence of perforation or surgical abdomen or peritonitis.     Clinical Course    ____________________________________________   FINAL CLINICAL IMPRESSION(S) / ED DIAGNOSES  Final diagnoses:  Constipation, unspecified constipation type  Fecal impaction (HCC)      New Prescriptions   DOCUSATE SODIUM (COLACE) 100 MG CAPSULE    Take 2 capsules (200 mg total) by mouth 2 (two) times daily.   POLYETHYLENE GLYCOL POWDER (GLYCOLAX/MIRALAX) POWDER    2 cap fulls in a full glass of water, three times a day, for 5 days.   SENNA (SENOKOT) 8.6 MG TABS TABLET    Take 2 tablets (17.2 mg total) by mouth 2 (two) times daily.     Portions of this note were generated with dragon dictation software. Dictation errors may occur despite best attempts at proofreading.    Sharman CheekPhillip Ayen Viviano, MD 02/18/16 2226

## 2016-02-18 NOTE — ED Triage Notes (Addendum)
Pt ambulatory to triage in NAD, report constipation x 4 days, states chest pain as well accompanied by n/v.  Pt report chest pain x 1 day, described as pressure and tight.  Pt states some possible anal tears, reports bleeding when wiping.   Pt reports currently in NA, has been clean of heroin for almost two months.  Pt also reports family hx of bowel obstructions

## 2016-06-25 ENCOUNTER — Emergency Department
Admission: EM | Admit: 2016-06-25 | Discharge: 2016-06-25 | Disposition: A | Discharge status: Home / Self Care | Payer: Federal, State, Local not specified - PPO | Attending: Emergency Medicine | Admitting: Emergency Medicine

## 2016-06-25 ENCOUNTER — Encounter: Payer: Self-pay | Admitting: Emergency Medicine

## 2016-06-25 DIAGNOSIS — R339 Retention of urine, unspecified: Secondary | ICD-10-CM | POA: Diagnosis present

## 2016-06-25 DIAGNOSIS — Z79899 Other long term (current) drug therapy: Secondary | ICD-10-CM | POA: Insufficient documentation

## 2016-06-25 DIAGNOSIS — F17210 Nicotine dependence, cigarettes, uncomplicated: Secondary | ICD-10-CM | POA: Diagnosis not present

## 2016-06-25 DIAGNOSIS — R202 Paresthesia of skin: Secondary | ICD-10-CM | POA: Insufficient documentation

## 2016-06-25 DIAGNOSIS — E039 Hypothyroidism, unspecified: Secondary | ICD-10-CM | POA: Insufficient documentation

## 2016-06-25 DIAGNOSIS — N390 Urinary tract infection, site not specified: Secondary | ICD-10-CM | POA: Insufficient documentation

## 2016-06-25 DIAGNOSIS — K5903 Drug induced constipation: Secondary | ICD-10-CM | POA: Insufficient documentation

## 2016-06-25 LAB — BASIC METABOLIC PANEL
Anion gap: 10 (ref 5–15)
BUN: 14 mg/dL (ref 6–20)
CALCIUM: 8.8 mg/dL — AB (ref 8.9–10.3)
CO2: 28 mmol/L (ref 22–32)
CREATININE: 1.05 mg/dL (ref 0.61–1.24)
Chloride: 101 mmol/L (ref 101–111)
GLUCOSE: 137 mg/dL — AB (ref 65–99)
Potassium: 3.4 mmol/L — ABNORMAL LOW (ref 3.5–5.1)
Sodium: 139 mmol/L (ref 135–145)

## 2016-06-25 LAB — URINALYSIS, COMPLETE (UACMP) WITH MICROSCOPIC
BILIRUBIN URINE: NEGATIVE
GLUCOSE, UA: NEGATIVE mg/dL
Hgb urine dipstick: NEGATIVE
KETONES UR: NEGATIVE mg/dL
Leukocytes, UA: NEGATIVE
Nitrite: POSITIVE — AB
PROTEIN: 30 mg/dL — AB
Specific Gravity, Urine: 1.019 (ref 1.005–1.030)
pH: 5 (ref 5.0–8.0)

## 2016-06-25 LAB — CBC
HCT: 45.6 % (ref 40.0–52.0)
Hemoglobin: 15.6 g/dL (ref 13.0–18.0)
MCH: 30.3 pg (ref 26.0–34.0)
MCHC: 34.1 g/dL (ref 32.0–36.0)
MCV: 88.8 fL (ref 80.0–100.0)
PLATELETS: 201 10*3/uL (ref 150–440)
RBC: 5.14 MIL/uL (ref 4.40–5.90)
RDW: 13.3 % (ref 11.5–14.5)
WBC: 8 10*3/uL (ref 3.8–10.6)

## 2016-06-25 LAB — CHLAMYDIA/NGC RT PCR (ARMC ONLY)
CHLAMYDIA TR: NOT DETECTED
N gonorrhoeae: NOT DETECTED

## 2016-06-25 MED ORDER — NALOXONE HCL 2 MG/2ML IJ SOSY
6.0000 mg | PREFILLED_SYRINGE | Freq: Once | INTRAMUSCULAR | Status: AC
  Administered 2016-06-25: 6 mg via INTRAVENOUS
  Filled 2016-06-25: qty 6

## 2016-06-25 MED ORDER — CIPROFLOXACIN HCL 500 MG PO TABS
500.0000 mg | ORAL_TABLET | Freq: Once | ORAL | Status: AC
Start: 2016-06-25 — End: 2016-06-25
  Administered 2016-06-25: 500 mg via ORAL
  Filled 2016-06-25: qty 1

## 2016-06-25 MED ORDER — CIPROFLOXACIN HCL 500 MG PO TABS: 500.0000 mg | ORAL_TABLET | Freq: Two times a day (BID) | ORAL | 0 refills | Status: AC

## 2016-06-25 MED ORDER — NALOXONE HCL 2 MG/2ML IJ SOSY
PREFILLED_SYRINGE | INTRAMUSCULAR | Status: AC
  Administered 2016-06-25: 6 mg via INTRAVENOUS
  Filled 2016-06-25: qty 6

## 2016-06-25 MED ORDER — FLAVOXATE HCL 100 MG PO TABS: 100.0000 mg | ORAL_TABLET | Freq: Three times a day (TID) | ORAL | 0 refills | Status: DC | PRN

## 2016-06-25 NOTE — Discharge Instructions (Signed)
Please take all of your antibiotics as prescribed. Return to the emergency department for any new or worsening symptoms such as fevers, chills, back pain, if you cannot urinate, or for any other concerns.  Congratulations on your continued sobriety. This is a really big deal.  It was a pleasure to take care of you today, and thank you for coming to our emergency department.  If you have any questions or concerns before leaving please ask the nurse to grab me and I'm more than happy to go through your aftercare instructions again.  If you were prescribed any opioid pain medication today such as Norco, Vicodin, Percocet, morphine, hydrocodone, or oxycodone please make sure you do not drive when you are taking this medication as it can alter your ability to drive safely.  If you have any concerns once you are home that you are not improving or are in fact getting worse before you can make it to your follow-up appointment, please do not hesitate to call 911 and come back for further evaluation.  Merrily Brittle MD  Results for orders placed or performed during the hospital encounter of 06/25/16  Urinalysis, Complete w Microscopic  Result Value Ref Range   Color, Urine AMBER (A) YELLOW   APPearance CLEAR (A) CLEAR   Specific Gravity, Urine 1.019 1.005 - 1.030   pH 5.0 5.0 - 8.0   Glucose, UA NEGATIVE NEGATIVE mg/dL   Hgb urine dipstick NEGATIVE NEGATIVE   Bilirubin Urine NEGATIVE NEGATIVE   Ketones, ur NEGATIVE NEGATIVE mg/dL   Protein, ur 30 (A) NEGATIVE mg/dL   Nitrite POSITIVE (A) NEGATIVE   Leukocytes, UA NEGATIVE NEGATIVE   RBC / HPF 0-5 0 - 5 RBC/hpf   WBC, UA 0-5 0 - 5 WBC/hpf   Bacteria, UA RARE (A) NONE SEEN   Squamous Epithelial / LPF 0-5 (A) NONE SEEN   Mucous PRESENT    Hyaline Casts, UA PRESENT   Basic metabolic panel  Result Value Ref Range   Sodium 139 135 - 145 mmol/L   Potassium 3.4 (L) 3.5 - 5.1 mmol/L   Chloride 101 101 - 111 mmol/L   CO2 28 22 - 32 mmol/L   Glucose, Bld 137 (H) 65 - 99 mg/dL   BUN 14 6 - 20 mg/dL   Creatinine, Ser 1.61 0.61 - 1.24 mg/dL   Calcium 8.8 (L) 8.9 - 10.3 mg/dL   GFR calc non Af Amer >60 >60 mL/min   GFR calc Af Amer >60 >60 mL/min   Anion gap 10 5 - 15  CBC  Result Value Ref Range   WBC 8.0 3.8 - 10.6 K/uL   RBC 5.14 4.40 - 5.90 MIL/uL   Hemoglobin 15.6 13.0 - 18.0 g/dL   HCT 09.6 04.5 - 40.9 %   MCV 88.8 80.0 - 100.0 fL   MCH 30.3 26.0 - 34.0 pg   MCHC 34.1 32.0 - 36.0 g/dL   RDW 81.1 91.4 - 78.2 %   Platelets 201 150 - 440 K/uL

## 2016-06-25 NOTE — ED Provider Notes (Signed)
[facility] Emergency Department Provider Note  ____________________________________________   First MD Initiated Contact with Patient 06/25/16 1758     (approximate)  I have reviewed the triage vital signs and the nursing notes.   HISTORY  Chief Complaint Urinary Retention and Constipation    HPI [name] is a 26 y.o. male who comes to the emergency department with multiple issues. 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 He says that for the past week he's not had a bowel movement but he is passing flatus daily. He is also concerned that for the past 4 days or so he has been extremely itchy and he's been picking at his skin and he is worried about rashes that he is developed over his body. For the last 2 days he has noted that it is been extremely difficult to urinate and he can only urinate by pouring warm water over his genitals. He feels achy cannot completely urinate. He has no fevers or chills. No back pain or flank pain. He does report some dysuria.This causes a moderate sensation of lower abdominal fullness that is nonradiating improved with urination worsened by not urinating.     Past Medical History:  Diagnosis Date  . Bipolar 1 disorder (HCC)   . Depression   . Hypothyroidism   . PTSD (post-traumatic stress disorder)   . Suicide attempt (HCC)   . Thyroid disease     Patient Active Problem List   Diagnosis Date Noted  . Vomiting 11/27/2014  . Dyspnea 11/26/2014  . Leucocytosis 11/26/2014  . Autism 11/16/2014  . Bipolar 1 disorder (HCC) 11/16/2014  . ARDS (adult respiratory distress syndrome) (HCC) 11/16/2014  . Severe sepsis with acute organ dysfunction (HCC) 11/16/2014  . Aspiration pneumonia (HCC) 11/15/2014  . Overdose   . Acute respiratory failure with hypoxia (HCC) 11/13/2014    Past Surgical History:  Procedure Laterality Date  . APPENDECTOMY      Prior to  Admission medications   Medication Sig Start Date End Date Taking? Authorizing Provider  amoxicillin-clavulanate (AUGMENTIN) 875-125 MG per tablet Take 1 tablet by mouth every 12 (twelve) hours. 11/29/14   Gale Journey, MD  ciprofloxacin (CIPRO) 500 MG tablet Take 1 tablet (500 mg total) by mouth 2 (two) times daily. 06/25/16 07/05/16  Merrily Brittle, MD  citalopram (CELEXA) 40 MG tablet Take 40 mg by mouth daily.    Historical Provider, MD  docusate sodium (COLACE) 100 MG capsule Take 2 capsules (200 mg total) by mouth 2 (two) times daily. 02/18/16   Sharman Cheek, MD  esomeprazole (NEXIUM) 40 MG capsule Take 40 mg by mouth at bedtime.     Historical Provider, MD  flavoxATE (URISPAS) 100 MG tablet Take 1 tablet (100 mg total) by mouth 3 (three) times daily as needed for bladder spasms. 06/25/16   Merrily Brittle, MD  lamoTRIgine (LAMICTAL) 150 MG tablet Take 150 mg by mouth 2 (two) times daily.    Historical Provider, MD  levothyroxine (SYNTHROID, LEVOTHROID) 75 MCG tablet Take 75 mcg by mouth daily before breakfast.    Historical Provider, MD  ondansetron (ZOFRAN ODT) 4 MG disintegrating tablet Take 1 tablet (4 mg total) by mouth every 8 (eight) hours as needed for nausea or vomiting. 11/29/14   Gale Journey, MD  polyethylene glycol powder (GLYCOLAX/MIRALAX) powder 2 cap fulls in a full glass of water, three times a day, for 5 days.  02/18/16   Sharman Cheek, MD  QUEtiapine (SEROQUEL) 100 MG tablet Take one tab in am and two tabs in the PM Patient not taking: Reported on 11/27/2014 11/25/14   Alford Highland, MD  rizatriptan (MAXALT) 10 MG tablet Take 10 mg by mouth as needed for migraine. May repeat in 2 hours if needed    Historical Provider, MD  senna (SENOKOT) 8.6 MG TABS tablet Take 2 tablets (17.2 mg total) by mouth 2 (two) times daily. 02/18/16   Sharman Cheek, MD    Allergies Sulfa antibiotics  Family History  Problem Relation Age of Onset  . Breast cancer Mother      Social History Social History  Substance Use Topics  . Smoking status: Current Some Day Smoker    Packs/day: 1.00    Types: Cigarettes  . Smokeless tobacco: Never Used  . Alcohol use No    Review of Systems Constitutional: No fever/chills Eyes: No visual changes. ENT: No sore throat. Cardiovascular: Denies chest pain. Respiratory: Denies shortness of breath. Gastrointestinal: Positive abdominal pain.  No nausea, no vomiting.  No diarrhea.  Positive constipation. Genitourinary: Negative for dysuria. Musculoskeletal: Negative for back pain. Skin: Positive for rash. Neurological: Negative for headaches, focal weakness or numbness.  10-point ROS otherwise negative.  ____________________________________________   PHYSICAL EXAM:  VITAL SIGNS: ED Triage Vitals  Enc Vitals Group     BP 06/25/16 1510 (!) 126/91     Pulse Rate 06/25/16 1510 88     Resp 06/25/16 1510 18     Temp 06/25/16 1510 99 F (37.2 C)     Temp Source 06/25/16 1510 Oral     SpO2 06/25/16 1510 97 %     Weight 06/25/16 1510 300 lb (136.1 kg)     Height 06/25/16 1510  (1.702 m)     Head Circumference --      Peak Flow --      Pain Score 06/25/16 1509 4     Pain Loc --      Pain Edu? --      Excl. in GC? --     Constitutional: Alert and oriented x 4 well appearing nontoxic no diaphoresis speaks in full, clear sentencesObese Eyes: PERRL EOMI. Head: Atraumatic. Nose: No congestion/rhinnorhea. Mouth/Throat: No trismus Neck: No stridor.   Cardiovascular: Normal rate, regular rhythm. Grossly normal heart sounds.  Good peripheral circulation. Respiratory: Normal respiratory effort.  No retractions. Lungs CTAB and moving good air Gastrointestinal: Soft nondistended nontender no rebound no guarding no peritonitis no McBurney's tenderness negative Rovsing's no costovertebral tenderness negative Murphy's obese abdomen Musculoskeletal: No lower extremity edema   Neurologic:  Normal speech and  language. No gross focal neurologic deficits are appreciated. Skin:  Innumerable small lesions across his face neck chest arms legs and back which appear consistent with scratching and picking without evidence of superinfection Psychiatric: Mood and affect are normal. Speech and behavior are normal.    ____________________________________________   ____________________________________________   LABS (all labs ordered are listed, but only abnormal results are displayed)  Labs Reviewed  URINALYSIS, COMPLETE (UACMP) WITH MICROSCOPIC - Abnormal; Notable for the following:       Result Value   Color, Urine AMBER (*)    APPearance CLEAR (*)    Protein, ur 30 (*)    Nitrite POSITIVE (*)    Bacteria, UA RARE (*)    Squamous Epithelial / LPF 0-5 (*)    All other components within normal limits  BASIC METABOLIC PANEL - Abnormal;  Notable for the following:    Potassium 3.4 (*)    Glucose, Bld 137 (*)    Calcium 8.8 (*)    All other components within normal limits  CHLAMYDIA/NGC RT PCR ([facility] ONLY)  URINE CULTURE  CBC    Urinalysis is consistent with infection __________________________________________  EKG   ____________________________________________  RADIOLOGY   ____________________________________________   PROCEDURES  Procedure(s) performed: no  Procedures  Critical Care performed: no  ____________________________________________   INITIAL IMPRESSION / ASSESSMENT AND PLAN / ED COURSE  Pertinent labs & imaging results that were available during my care of the patient were reviewed by me and considered in my medical decision making (see chart for details).  The patient has multiple issues going on which I believe are related to his Suboxone treatment. First of all he was concerned that he might be allergic to Suboxone but I reassured him that his symptoms are side effects but not allergies. Regarding his rash it is likely secondary to formication from  the opiate and is not superinfected. Regarding his constipation I will treat him with an oral dose of 6 mg of naloxone now and encouraged him to increase the fiber in his diet and increase his water intake and to take magnesium citrate as needed. Guarding his urinary retention he clearly has a urinary tract infection at this point which is abnormal and the man. Urine culture is been sent and I will treat him with ciprofloxacin. On literature review there are case reports of Suboxone causing significant urinary retention. I recommended to the patient that he receive a Foley catheter because it is abnormal for him to have a urinary tract infection at this point and I'm concerned that it could progress on to pyelonephritis. He declined stating with warm water he is able to urinate. His father has had UTIs in the past and is taken urispas and is requesting a prescription now. I told the patient and his family that I've never prescribed this medication and it seemed like a reasonable first choice but he had to promise that he would return should his symptoms worsen or if he is not able to urinate. A first dose of ciprofloxacin and will be given here today in the ER and he is discharged home in improved condition.      ____________________________________________   FINAL CLINICAL IMPRESSION(S) / ED DIAGNOSES  Final diagnoses:  Lower urinary tract infectious disease  Urinary retention  Drug-induced constipation  Formication      NEW MEDICATIONS STARTED DURING THIS VISIT:  Discharge Medication List as of 06/25/2016  7:15 PM    START taking these medications   Details  ciprofloxacin (CIPRO) 500 MG tablet Take 1 tablet (500 mg total) by mouth 2 (two) times daily., Starting Fri 06/25/2016, Until Mon 07/05/2016, Print    flavoxATE (URISPAS) 100 MG tablet Take 1 tablet (100 mg total) by mouth 3 (three) times daily as needed for bladder spasms., Starting Fri 06/25/2016, Print         Note:  This  document was prepared using Dragon voice recognition software and may include unintentional dictation errors.     Merrily Brittle, MD 06/26/16 1017

## 2016-06-25 NOTE — ED Triage Notes (Signed)
Patient presents to the ED with urinary retention x 1 week.  Patient states, "I have to pour warm water over my genitals to urinate even though I'm really well hydrated."  Patient also reports rash to his abdomen that itches at night.  Patient also states he has not had much to eat this past week and has vomited multiple times although he has not vomited in the past 24 hours.  Patient states he has not had a bowel movement in 1 week.

## 2016-06-26 LAB — URINE CULTURE: CULTURE: NO GROWTH

## 2016-08-23 ENCOUNTER — Encounter: Payer: Self-pay | Admitting: Emergency Medicine

## 2016-08-23 DIAGNOSIS — Z79899 Other long term (current) drug therapy: Secondary | ICD-10-CM | POA: Insufficient documentation

## 2016-08-23 DIAGNOSIS — F1190 Opioid use, unspecified, uncomplicated: Secondary | ICD-10-CM | POA: Diagnosis not present

## 2016-08-23 DIAGNOSIS — E039 Hypothyroidism, unspecified: Secondary | ICD-10-CM | POA: Insufficient documentation

## 2016-08-23 DIAGNOSIS — F17210 Nicotine dependence, cigarettes, uncomplicated: Secondary | ICD-10-CM | POA: Insufficient documentation

## 2016-08-23 DIAGNOSIS — K5903 Drug induced constipation: Secondary | ICD-10-CM | POA: Diagnosis not present

## 2016-08-23 DIAGNOSIS — K5900 Constipation, unspecified: Secondary | ICD-10-CM | POA: Diagnosis present

## 2016-08-23 LAB — CBC
HEMATOCRIT: 44.2 % (ref 40.0–52.0)
HEMOGLOBIN: 15 g/dL (ref 13.0–18.0)
MCH: 29.1 pg (ref 26.0–34.0)
MCHC: 33.9 g/dL (ref 32.0–36.0)
MCV: 85.7 fL (ref 80.0–100.0)
Platelets: 274 10*3/uL (ref 150–440)
RBC: 5.17 MIL/uL (ref 4.40–5.90)
RDW: 13.6 % (ref 11.5–14.5)
WBC: 9.2 10*3/uL (ref 3.8–10.6)

## 2016-08-23 LAB — TYPE AND SCREEN
ABO/RH(D): O POS
Antibody Screen: NEGATIVE

## 2016-08-23 LAB — COMPREHENSIVE METABOLIC PANEL
ALK PHOS: 52 U/L (ref 38–126)
ALT: 33 U/L (ref 17–63)
ANION GAP: 9 (ref 5–15)
AST: 28 U/L (ref 15–41)
Albumin: 4.6 g/dL (ref 3.5–5.0)
BUN: 14 mg/dL (ref 6–20)
CALCIUM: 9.2 mg/dL (ref 8.9–10.3)
CHLORIDE: 110 mmol/L (ref 101–111)
CO2: 22 mmol/L (ref 22–32)
Creatinine, Ser: 0.67 mg/dL (ref 0.61–1.24)
GFR calc non Af Amer: >60 mL/min (ref >60)
Glucose, Bld: 104 mg/dL — ABNORMAL HIGH (ref 65–99)
Potassium: 3.9 mmol/L (ref 3.5–5.1)
SODIUM: 141 mmol/L (ref 135–145)
Total Bilirubin: 0.9 mg/dL (ref 0.3–1.2)
Total Protein: 7.8 g/dL (ref 6.5–8.1)

## 2016-08-23 NOTE — ED Triage Notes (Signed)
Patient ambulatory to triage with steady gait, without difficulty or distress noted; pt reports "I have a fecal impaction for several days, hemorrhoids for a month and rectal bleeding x 2 days"; denies any abd pain

## 2016-08-24 ENCOUNTER — Emergency Department
Admission: EM | Admit: 2016-08-24 | Discharge: 2016-08-24 | Disposition: A | Discharge status: Home / Self Care | Payer: Federal, State, Local not specified - PPO | Attending: Emergency Medicine | Admitting: Emergency Medicine

## 2016-08-24 DIAGNOSIS — K5903 Drug induced constipation: Secondary | ICD-10-CM

## 2016-08-24 DIAGNOSIS — K567 Ileus, unspecified: Secondary | ICD-10-CM

## 2016-08-24 MED ORDER — LIDOCAINE HCL 2 % EX GEL
1.0000 "application " | Freq: Once | CUTANEOUS | Status: AC
  Administered 2016-08-24: 1 via TOPICAL
  Filled 2016-08-24: qty 5

## 2016-08-24 MED ORDER — MAGNESIUM CITRATE PO SOLN
1.0000 | Freq: Once | ORAL | Status: AC
  Administered 2016-08-24: 1 via ORAL
  Filled 2016-08-24: qty 296

## 2016-08-24 MED ORDER — MINERAL OIL RE ENEM
1.0000 | ENEMA | Freq: Once | RECTAL | Status: AC
  Administered 2016-08-24: 1 via RECTAL

## 2016-08-24 MED ORDER — DOCUSATE SODIUM 50 MG/5ML PO LIQD
100.0000 mg | Freq: Once | ORAL | Status: AC
  Administered 2016-08-24: 100 mg via ORAL
  Filled 2016-08-24: qty 10

## 2016-08-24 MED ORDER — POLYETHYLENE GLYCOL 3350 17 G PO PACK
17.0000 g | PACK | Freq: Every day | ORAL | Status: DC
  Administered 2016-08-24: 17 g via ORAL
  Filled 2016-08-24: qty 1

## 2016-08-24 MED ORDER — POLYETHYLENE GLYCOL 3350 17 G PO PACK: 17.0000 g | PACK | Freq: Every day | ORAL | 0 refills | Status: DC | PRN

## 2016-08-24 NOTE — ED Notes (Signed)
Patient is having rectal bleeding, "i think I am fecal impacted." "I also can't pee because of this fecal impaction."

## 2016-08-27 NOTE — ED Provider Notes (Signed)
[facility] Emergency Department Provider Note   First MD Initiated Contact with Patient 08/24/16 0103     (approximate)  I have reviewed the triage vital signs and the nursing notes.   HISTORY  Chief Complaint Rectal Bleeding and Hemorrhoids    HPI [name] is a 26 y.o. male presents to the emergency department with a one-week history of constipation. Patient states that he has a "fecal impaction". Patient admits to previous episodes of constipation. Patient does admit to Suboxone use secondary to previous opiate abuse. Patient denies any fever no vomiting   Past Medical History:  Diagnosis Date  . Bipolar 1 disorder (HCC)   . Depression   . Hypothyroidism   . PTSD (post-traumatic stress disorder)   . Suicide attempt (HCC)   . Thyroid disease     Patient Active Problem List   Diagnosis Date Noted  . Vomiting 11/27/2014  . Dyspnea 11/26/2014  . Leucocytosis 11/26/2014  . Autism 11/16/2014  . Bipolar 1 disorder (HCC) 11/16/2014  . ARDS (adult respiratory distress syndrome) (HCC) 11/16/2014  . Severe sepsis with acute organ dysfunction (HCC) 11/16/2014  . Aspiration pneumonia (HCC) 11/15/2014  . Overdose   . Acute respiratory failure with hypoxia (HCC) 11/13/2014    Past Surgical History:  Procedure Laterality Date  . APPENDECTOMY      Prior to Admission medications   Medication Sig Start Date End Date Taking? Authorizing Provider  amoxicillin-clavulanate (AUGMENTIN) 875-125 MG per tablet Take 1 tablet by mouth every 12 (twelve) hours. 11/29/14   Gale JourneyWalsh, Catherine P, MD  citalopram (CELEXA) 40 MG tablet Take 40 mg by mouth daily.    [provider]  docusate sodium (COLACE) 100 MG capsule Take 2 capsules (200 mg total) by mouth 2 (two) times daily. 02/18/16   Sharman CheekStafford, Phillip, MD  esomeprazole (NEXIUM) 40 MG capsule Take 40 mg by mouth at bedtime.     [provider]  flavoxATE (URISPAS) 100 MG tablet Take 1 tablet  (100 mg total) by mouth 3 (three) times daily as needed for bladder spasms. 06/25/16   Merrily Brittleifenbark, Neil, MD  lamoTRIgine (LAMICTAL) 150 MG tablet Take 150 mg by mouth 2 (two) times daily.    [provider]  levothyroxine (SYNTHROID, LEVOTHROID) 75 MCG tablet Take 75 mcg by mouth daily before breakfast.    [provider]  ondansetron (ZOFRAN ODT) 4 MG disintegrating tablet Take 1 tablet (4 mg total) by mouth every 8 (eight) hours as needed for nausea or vomiting. 11/29/14   Gale JourneyWalsh, Catherine P, MD  polyethylene glycol [facility](MIRALAX) packet Take 17 g by mouth daily as needed. 08/24/16   Darci CurrentBrown,  N, MD  polyethylene glycol powder [facility](GLYCOLAX/MIRALAX) powder 2 cap fulls in a full glass of water, three times a day, for 5 days. 02/18/16   Sharman CheekStafford, Phillip, MD  QUEtiapine (SEROQUEL) 100 MG tablet Take one tab in am and two tabs in the PM Patient not taking: Reported on 11/27/2014 11/25/14   Alford HighlandWieting, Richard, MD  rizatriptan (MAXALT) 10 MG tablet Take 10 mg by mouth as needed for migraine. May repeat in 2 hours if needed    [provider]  senna (SENOKOT) 8.6 MG TABS tablet Take 2 tablets (17.2 mg total) by mouth 2 (two) times daily. 02/18/16   Sharman CheekStafford, Phillip, MD    Allergies Sulfa antibiotics  Family History  Problem Relation Age of Onset  . Breast cancer Mother     Social History Social History  Substance Use  Topics  . Smoking status: Current Some Day Smoker    Packs/day: 1.00    Types: Cigarettes  . Smokeless tobacco: Never Used  . Alcohol use No    Review of Systems Constitutional: No fever/chills Eyes: No visual changes. ENT: No sore throat. Cardiovascular: Denies chest pain. Respiratory: Denies shortness of breath. Gastrointestinal: No abdominal pain.  No nausea, no vomiting.  No diarrhea.  Positive for constipation. Genitourinary: Negative for dysuria. Musculoskeletal: Negative for neck pain.  Negative for back pain. Integumentary: Negative for  rash. Neurological: Negative for headaches, focal weakness or numbness.   ____________________________________________   PHYSICAL EXAM:  VITAL SIGNS: ED Triage Vitals  Enc Vitals Group     BP 08/23/16 2220 (!) 110/50     Pulse Rate 08/23/16 2220 (!) 109     Resp 08/23/16 2220 20     Temp 08/23/16 2220 98.8 F (37.1 C)     Temp Source 08/23/16 2220 Oral     SpO2 08/23/16 2220 97 %     Weight 08/23/16 2219 136.1 kg (300 lb)     Height 08/23/16 2219 1.702 m (5\' 7" )     Head Circumference --      Peak Flow --      Pain Score 08/23/16 2219 6     Pain Loc --      Pain Edu? --      Excl. in GC? --     Constitutional: Alert and oriented. Well appearing and in no acute distress. Eyes: Conjunctivae are normal.  Head: Atraumatic. Mouth/Throat: Mucous membranes are moist. Neck: No stridor.  Cardiovascular: Normal rate, regular rhythm. Good peripheral circulation. Grossly normal heart sounds. Respiratory: Normal respiratory effort.  No retractions. Lungs CTAB. Gastrointestinal: Soft and nontender. No distention.  Guaiac-negative soft stool noted in the rectum. Musculoskeletal: No lower extremity tenderness nor edema. No gross deformities of extremities. Neurologic:  Normal speech and language. No gross focal neurologic deficits are appreciated.  Skin:  Skin is warm, dry and intact. No rash noted. Psychiatric: Mood and affect are normal. Speech and behavior are normal. ____________________________________________   LABS (all labs ordered are listed, but only abnormal results are displayed)  Labs Reviewed  COMPREHENSIVE METABOLIC PANEL - Abnormal; Notable for the following:       Result Value   Glucose, Bld 104 (*)    All other components within normal limits  CBC  POC OCCULT BLOOD, ED  TYPE AND SCREEN     Procedures   ____________________________________________   INITIAL IMPRESSION / ASSESSMENT AND PLAN / ED COURSE  Pertinent labs & imaging results that were  available during my care of the patient were reviewed by me and considered in my medical decision making (see chart for details).  Patient given an enema in the emergency department followed by defecation.      ____________________________________________  FINAL CLINICAL IMPRESSION(S) / ED DIAGNOSES  Final diagnoses:  Drug-induced constipation  Ileus (HCC)     MEDICATIONS GIVEN DURING THIS VISIT:  Medications  lidocaine (XYLOCAINE) 2 % jelly 1 application (1 application Topical Given 08/24/16 0220)  docusate (COLACE) 50 MG/5ML liquid 100 mg (100 mg Oral Given 08/24/16 0220)  mineral oil enema 1 enema (1 enema Rectal Given 08/24/16 0219)  magnesium citrate solution 1 Bottle (1 Bottle Oral Given 08/24/16 0219)     NEW OUTPATIENT MEDICATIONS STARTED DURING THIS VISIT:  Discharge Medication List as of 08/24/2016  3:44 AM    START taking these medications   Details  polyethylene glycol (  MIRALAX) packet Take 17 g by mouth daily as needed., Starting Tue 08/24/2016, Print        Discharge Medication List as of 08/24/2016  3:44 AM      Discharge Medication List as of 08/24/2016  3:44 AM       Note:  This document was prepared using Dragon voice recognition software and may include unintentional dictation errors.    Darci Current, MD 08/27/16 [phone]

## 2017-03-09 ENCOUNTER — Emergency Department: Payer: Federal, State, Local not specified - PPO

## 2017-03-09 ENCOUNTER — Other Ambulatory Visit: Payer: Self-pay

## 2017-03-09 ENCOUNTER — Emergency Department
Admission: EM | Admit: 2017-03-09 | Discharge: 2017-03-09 | Disposition: A | Discharge status: Home / Self Care | Payer: Federal, State, Local not specified - PPO | Attending: Emergency Medicine | Admitting: Emergency Medicine

## 2017-03-09 ENCOUNTER — Encounter: Payer: Self-pay | Admitting: Emergency Medicine

## 2017-03-09 DIAGNOSIS — K5641 Fecal impaction: Secondary | ICD-10-CM

## 2017-03-09 DIAGNOSIS — K5900 Constipation, unspecified: Secondary | ICD-10-CM | POA: Diagnosis present

## 2017-03-09 DIAGNOSIS — Z79899 Other long term (current) drug therapy: Secondary | ICD-10-CM | POA: Insufficient documentation

## 2017-03-09 DIAGNOSIS — K5903 Drug induced constipation: Secondary | ICD-10-CM

## 2017-03-09 DIAGNOSIS — E039 Hypothyroidism, unspecified: Secondary | ICD-10-CM | POA: Insufficient documentation

## 2017-03-09 DIAGNOSIS — F17210 Nicotine dependence, cigarettes, uncomplicated: Secondary | ICD-10-CM | POA: Insufficient documentation

## 2017-03-09 MED ORDER — LACTULOSE 10 G PO PACK: 10.0000 g | PACK | Freq: Three times a day (TID) | ORAL | 1 refills | Status: DC

## 2017-03-09 MED ORDER — MAGNESIUM CITRATE PO SOLN
1.0000 | Freq: Once | ORAL | Status: AC
  Administered 2017-03-09: 1
  Filled 2017-03-09: qty 296

## 2017-03-09 MED ORDER — DOCUSATE SODIUM 100 MG PO CAPS
ORAL_CAPSULE | ORAL | Status: AC
  Filled 2017-03-09: qty 1

## 2017-03-09 MED ORDER — BISACODYL 5 MG PO TBEC: 5.0000 mg | DELAYED_RELEASE_TABLET | Freq: Every day | ORAL | 1 refills | Status: DC | PRN

## 2017-03-09 MED ORDER — DOCUSATE SODIUM 50 MG/5ML PO LIQD
50.0000 mg | Freq: Once | ORAL | Status: AC
  Administered 2017-03-09: 50 mg
  Filled 2017-03-09: qty 10

## 2017-03-09 MED ORDER — LIDOCAINE VISCOUS 2 % MT SOLN
15.0000 mL | Freq: Once | OROMUCOSAL | Status: AC
  Administered 2017-03-09: 15 mL via OROMUCOSAL
  Filled 2017-03-09: qty 15

## 2017-03-09 MED ORDER — LIDOCAINE 5 % EX OINT: 1.0000 "application " | TOPICAL_OINTMENT | CUTANEOUS | 1 refills | Status: DC | PRN

## 2017-03-09 NOTE — ED Provider Notes (Signed)
[facility] Emergency Department Provider Note  ____________________________________________  Time seen: Approximately 8:48 PM  I have reviewed the triage vital signs and the nursing notes.   HISTORY  Chief Complaint Constipation    HPI [name] is a 27 y.o. male who presents emergency department complaining of constipation and rectal pain.  Patient reports that he is on Suboxone for chronic pain and states that he has an issue with constipation.  Patient recently increased his dose and has had increased constipation symptoms.  Patient reports that he has been trying multiple over-the-counter laxatives with no relief.  His last bowel movement was 2 weeks ago.  Patient reports that he is now starting to experience some rectal pain.  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  Patient has not seen any blood.  No history of GI bleed.  No other complaints at this time.  Past Medical History:  Diagnosis Date  . Bipolar 1 disorder (HCC)   . Depression   . Hypothyroidism   . PTSD (post-traumatic stress disorder)   . Suicide attempt (HCC)   . Thyroid disease     Patient Active Problem List   Diagnosis Date Noted  . Vomiting 11/27/2014  . Dyspnea 11/26/2014  . Leucocytosis 11/26/2014  . Autism 11/16/2014  . Bipolar 1 disorder (HCC) 11/16/2014  . ARDS (adult respiratory distress syndrome) (HCC) 11/16/2014  . Severe sepsis with acute organ dysfunction (HCC) 11/16/2014  . Aspiration pneumonia (HCC) 11/15/2014  . Overdose   . Acute respiratory failure with hypoxia (HCC) 11/13/2014    Past Surgical History:  Procedure Laterality Date  . APPENDECTOMY      Prior to Admission medications   Medication Sig Start Date End Date Taking? Authorizing Provider  amoxicillin-clavulanate (AUGMENTIN) 875-125 MG per tablet Take 1 tablet by mouth every 12 (twelve) hours. 11/29/14   Gale Journey, MD  bisacodyl (BISACODYL LAXATIVE) 5 MG EC tablet Take 1 tablet (5 mg total) by mouth daily as needed for moderate constipation. 03/09/17   Faithann Natal, Delorise Royals, PA-C  citalopram (CELEXA) 40 MG tablet Take 40 mg by mouth daily.    [provider]  docusate sodium (COLACE) 100 MG capsule Take 2 capsules (200 mg total) by mouth 2 (two) times daily. 02/18/16   Sharman Cheek, MD  esomeprazole (NEXIUM) 40 MG capsule Take 40 mg by mouth at bedtime.     [provider]  flavoxATE (URISPAS) 100 MG tablet Take 1 tablet (100 mg total) by mouth 3 (three) times daily as needed for bladder spasms. 06/25/16   Merrily Brittle, MD  lactulose (CEPHULAC) 10 g packet Take 1 packet (10 g total) by mouth 3 (three) times daily. 03/09/17   Keani Gotcher, Delorise Royals, PA-C  lamoTRIgine (LAMICTAL) 150 MG tablet Take 150 mg by mouth 2 (two) times daily.    [provider]  levothyroxine (SYNTHROID, LEVOTHROID) 75 MCG tablet Take 75 mcg by mouth daily before breakfast.    [provider]  lidocaine (XYLOCAINE) 5 % ointment Apply 1 application topically as needed. 03/09/17   Madgeline Rayo, Delorise Royals, PA-C  ondansetron (ZOFRAN ODT) 4 MG disintegrating tablet Take 1 tablet (4 mg total) by mouth every 8 (eight) hours as needed for nausea or vomiting. 11/29/14   Gale Journey, MD  polyethylene glycol [facility]) packet Take 17 g by mouth daily as needed. 08/24/16   Darci Current, MD  polyethylene glycol powder (  GLYCOLAX/MIRALAX) powder 2 cap fulls in a full glass of water, three times a day, for 5 days. 02/18/16   Sharman CheekStafford, Phillip, MD  QUEtiapine (SEROQUEL) 100 MG tablet Take one tab in am and two tabs in the PM Patient not taking: Reported on 11/27/2014 11/25/14   Alford HighlandWieting, Richard, MD  rizatriptan (MAXALT) 10 MG tablet Take 10 mg by mouth as needed for migraine. May repeat in 2 hours if needed    [provider]  senna (SENOKOT) 8.6 MG TABS tablet Take 2 tablets (17.2 mg total)  by mouth 2 (two) times daily. 02/18/16   Sharman CheekStafford, Phillip, MD    Allergies Sulfa antibiotics  Family History  Problem Relation Age of Onset  . Breast cancer Mother     Social History Social History   Tobacco Use  . Smoking status: Current Some Day Smoker    Packs/day: 1.00    Types: Cigarettes  . Smokeless tobacco: Never Used  Substance Use Topics  . Alcohol use: No  . Drug use: No     Review of Systems  Constitutional: No fever/chills Eyes: No visual changes.  Cardiovascular: no chest pain. Respiratory: no cough. No SOB. Gastrointestinal: No abdominal pain.  No nausea, no vomiting.  No diarrhea.  Positive constipation.  Positive for rectal pain. Musculoskeletal: Negative for musculoskeletal pain. Skin: Negative for rash, abrasions, lacerations, ecchymosis. Neurological: Negative for headaches, focal weakness or numbness. 10-point ROS otherwise negative.  ____________________________________________   PHYSICAL EXAM:  VITAL SIGNS: ED Triage Vitals  Enc Vitals Group     BP 03/09/17 1922 109/78     Pulse Rate 03/09/17 1922 (!) 108     Resp 03/09/17 1922 19     Temp 03/09/17 1922 98.6 F (37 C)     Temp Source 03/09/17 1922 Oral     SpO2 03/09/17 1922 95 %     Weight 03/09/17 1923 300 lb (136.1 kg)     Height 03/09/17 1923 5\' 7"  (1.702 m)     Head Circumference --      Peak Flow --      Pain Score 03/09/17 1929 7     Pain Loc --      Pain Edu? --      Excl. in GC? --      Constitutional: Alert and oriented. Well appearing and in no acute distress. Eyes: Conjunctivae are normal. PERRL. EOMI. Head: Atraumatic. ENT:      Ears:       Nose: No congestion/rhinnorhea.      Mouth/Throat: Mucous membranes are moist.  Neck: No stridor.    Cardiovascular: Normal rate, regular rhythm. Normal S1 and S2.  Good peripheral circulation. Respiratory: Normal respiratory effort without tachypnea or retractions. Lungs CTAB. Good air entry to the bases with no decreased  or absent breath sounds. Gastrointestinal: Bowel sounds 4 quadrants. Soft and nontender to palpation. No guarding or rigidity. No palpable masses. No distention. No CVA tenderness. Musculoskeletal: Full range of motion to all extremities. No gross deformities appreciated. Neurologic:  Normal speech and language. No gross focal neurologic deficits are appreciated.  Skin:  Skin is warm, dry and intact. No rash noted. Psychiatric: Mood and affect are normal. Speech and behavior are normal. Patient exhibits appropriate insight and judgement.   ____________________________________________   LABS (all labs ordered are listed, but only abnormal results are displayed)  Labs Reviewed - No data to display ____________________________________________  EKG   ____________________________________________  RADIOLOGY Festus BarrenI, Latoia Eyster D Marvon Shillingburg, personally viewed and evaluated these  images (plain radiographs) as part of my medical decision making, as well as reviewing the written report by the radiologist.  Dg Abdomen 1 View  Result Date: 03/09/2017 CLINICAL DATA:  Constipation for 2 weeks common no relief from over the counter medications EXAM: ABDOMEN - 1 VIEW COMPARISON:  02/18/2016 FINDINGS: Increased stool in rectum. Single nonspecific loop of air-filled bowel in the LEFT mid abdomen. Remaining bowel gas pattern normal. No bowel dilatation or bowel wall thickening. Tiny nonobstructing calculus LEFT kidney 2 mm diameter. No additional urinary tract calcifications. Osseous structures unremarkable. IMPRESSION: Increased stool in rectum. 2 mm nonobstructing LEFT renal calculus. Electronically Signed   By: Ulyses Southward M.D.   On: 03/09/2017 19:53    ____________________________________________    PROCEDURES  Procedure(s) performed:    Procedures    Medications  docusate (COLACE) 50 MG/5ML liquid 50 mg (50 mg Per Tube Given 03/09/17 2202)  magnesium citrate solution 1 Bottle (1 Bottle Per Tube  Given 03/09/17 2202)  lidocaine (XYLOCAINE) 2 % viscous mouth solution 15 mL (15 mLs Mouth/Throat Given 03/09/17 2152)     ____________________________________________   INITIAL IMPRESSION / ASSESSMENT AND PLAN / ED COURSE  Pertinent labs & imaging results that were available during my care of the patient were reviewed by me and considered in my medical decision making (see chart for details).  Review of the [place] CSRS was performed in accordance of the [facility] prior to dispensing any controlled drugs.     Patient's diagnosis is consistent with constipation due to pain medication and fecal impaction in the rectum.  Patient presented with a 2-week history of constipation.  He had tried multiple over-the-counter medications with no relief.  X-ray reveals findings consistent with constipation with increased stool burden to the rectal area.  Patient was given options of oral medication or enema.  I stressed enema and patient was agreeable to same.  Patient was given soapsuds enema with Colace and milk of magnesium added.  This resulted in significant bowel movement here in the emergency department.  Patient reports that symptoms of pressure in the lower quadrant and rectal area has been fully alleviated at this time.  Patient is requesting prescription for laxative should this return.  He is also stating request to go to a lower dose of pain medication to keep this from reoccurring.  Patient was given instructions on drinking plenty of fluids, increasing fiber in his diet as well as taking a daily laxative such as MiraLAX.Marland Kitchen Patient will be discharged home with prescriptions for bisacodyl, lactulose, lidocaine ointment for symptomatic relief. Patient is to follow up with [facility] and pain management as needed or otherwise directed. Patient is given ED precautions to return to the ED for any worsening or new symptoms.     ____________________________________________  FINAL CLINICAL IMPRESSION(S) / ED  DIAGNOSES  Final diagnoses:  Constipation due to pain medication  Fecal impaction in rectum [facility])      NEW MEDICATIONS STARTED DURING THIS VISIT:  ED Discharge Orders        Ordered    bisacodyl (BISACODYL LAXATIVE) 5 MG EC tablet  Daily PRN     03/09/17 2319    lactulose (CEPHULAC) 10 g packet  3 times daily     03/09/17 2319    lidocaine (XYLOCAINE) 5 % ointment  As needed     03/09/17 2319          This chart was dictated using voice recognition software/Dragon. Despite best efforts to proofread, errors  can occur which can change the meaning. Any change was purely unintentional.    Racheal Patches, PA-C 03/09/17 2339    Nita Sickle, MD 03/10/17 [phone]

## 2017-03-09 NOTE — ED Triage Notes (Signed)
Patient ambulatory to triage with steady gait, without difficulty or distress noted; pt reports constipated x 2wks; using OTC meds without relief; denies accomp symptoms except rectal pain

## 2017-03-09 NOTE — ED Notes (Signed)
Pt reports constipation for 2 weeks.  Pt states taking otc meds without relief.  Intermittent abd pain

## 2017-08-18 ENCOUNTER — Other Ambulatory Visit: Payer: Self-pay

## 2017-08-18 ENCOUNTER — Encounter
Admission: RE | Admit: 2017-08-18 | Discharge: 2017-08-18 | Disposition: A | Discharge status: Home / Self Care | Payer: Federal, State, Local not specified - PPO | Source: Ambulatory Visit | Attending: Otolaryngology | Admitting: Otolaryngology

## 2017-08-18 DIAGNOSIS — Z01818 Encounter for other preprocedural examination: Secondary | ICD-10-CM | POA: Diagnosis present

## 2017-08-18 DIAGNOSIS — R001 Bradycardia, unspecified: Secondary | ICD-10-CM | POA: Insufficient documentation

## 2017-08-18 HISTORY — DX: Personal history of urinary calculi: Z87.442

## 2017-08-18 HISTORY — DX: Headache, unspecified: R51.9

## 2017-08-18 HISTORY — DX: Gastro-esophageal reflux disease without esophagitis: K21.9

## 2017-08-18 HISTORY — DX: Headache: R51

## 2017-08-18 NOTE — Patient Instructions (Signed)
[facility] 2[facility] To find out your arrival time, please call [phone] between 1PM - 3PM on: August 23, 2017 TUESDAY  REMEMBER: Instructions that are not followed completely may result in serious medical risk, up to and including death; or upon the discretion of your surgeon and anesthesiologist your surgery may need to be rescheduled.  Do not eat food after midnight the night before your procedure.  No gum chewing, lozengers or hard candies.  You may however, drink CLEAR liquids up to 2 hours before you are scheduled to arrive for your surgery. Do not drink anything within 2 hours of the start of your surgery.  Clear liquids include: - water  - apple juice without pulp - clear gatorade - black coffee or tea (Do NOT add anything to the coffee or tea) Do NOT drink anything that is not on this list.  Type 1 and Type 2 diabetics should only drink water.  No Alcohol for 24 hours before or after surgery.  No Smoking including e-cigarettes for 24 hours prior to surgery.  No chewable tobacco products for at least 6 hours prior to surgery.  No nicotine patches on the day of surgery.  On the morning of surgery brush your teeth with toothpaste and water, you may rinse your mouth with mouthwash if you wish. Do not swallow any toothpaste or mouthwash.  Notify your doctor if there is any change in your medical condition (cold, fever, infection).  Do not wear jewelry, make-up, hairpins, clips or nail polish.  Do not wear lotions, powders, or perfumes. You may wear deodorant.  Do not shave 48 hours prior to surgery. Men may shave face and neck.  Contacts and dentures may not be worn into surgery.  Do not bring valuables to the hospital, including drivers license, insurance or credit cards.  [facility] is not responsible for any belongings or valuables.   TAKE THESE MEDICATIONS THE MORNING  OF SURGERY: LEVOTHYROXINE CELEXA LAMICTAL PROTONIX  Stop Anti-inflammatories (NSAIDS) such as Advil, Aleve, Ibuprofen, Motrin, Naproxen, Naprosyn and Aspirin based products such as Excedrin, Goodys Powder, BC Powder. (May take Tylenol or Acetaminophen if needed.)  Stop ANY OVER THE COUNTER supplements until after surgery. (May continue Vitamin D, Vitamin B, and multivitamin.)  Wear comfortable clothing (specific to your surgery type) to the hospital.  Plan for stool softeners for home use.  If you are being discharged the day of surgery, you will not be allowed to drive home. You will need a responsible adult to drive you home and stay with you that night.   If you are taking public transportation, you will need to have a responsible adult with you. Please confirm with your physician that it is acceptable to use public transportation.   Please call [phone] if you have any questions about these instructions.

## 2017-08-24 ENCOUNTER — Ambulatory Visit
Admission: RE | Admit: 2017-08-24 | Discharge: 2017-08-24 | Disposition: A | Discharge status: Home / Self Care | Payer: Federal, State, Local not specified - PPO | Source: Ambulatory Visit | Attending: Otolaryngology | Admitting: Otolaryngology

## 2017-08-24 ENCOUNTER — Ambulatory Visit: Payer: Federal, State, Local not specified - PPO | Admitting: Anesthesiology

## 2017-08-24 ENCOUNTER — Encounter
Admission: RE | Disposition: A | Discharge status: Home / Self Care | Payer: Self-pay | Source: Ambulatory Visit | Attending: Otolaryngology

## 2017-08-24 ENCOUNTER — Other Ambulatory Visit: Payer: Self-pay

## 2017-08-24 ENCOUNTER — Encounter: Payer: Self-pay | Admitting: *Deleted

## 2017-08-24 DIAGNOSIS — J3501 Chronic tonsillitis: Secondary | ICD-10-CM | POA: Diagnosis not present

## 2017-08-24 DIAGNOSIS — F319 Bipolar disorder, unspecified: Secondary | ICD-10-CM | POA: Insufficient documentation

## 2017-08-24 DIAGNOSIS — Z6841 Body Mass Index (BMI) 40.0 and over, adult: Secondary | ICD-10-CM | POA: Insufficient documentation

## 2017-08-24 DIAGNOSIS — E039 Hypothyroidism, unspecified: Secondary | ICD-10-CM | POA: Diagnosis not present

## 2017-08-24 DIAGNOSIS — F17200 Nicotine dependence, unspecified, uncomplicated: Secondary | ICD-10-CM | POA: Diagnosis not present

## 2017-08-24 DIAGNOSIS — Z79899 Other long term (current) drug therapy: Secondary | ICD-10-CM | POA: Insufficient documentation

## 2017-08-24 DIAGNOSIS — K219 Gastro-esophageal reflux disease without esophagitis: Secondary | ICD-10-CM | POA: Diagnosis not present

## 2017-08-24 HISTORY — PX: TONSILLECTOMY: SHX5217

## 2017-08-24 LAB — URINE DRUG SCREEN, QUALITATIVE (ARMC ONLY)
AMPHETAMINES, UR SCREEN: NOT DETECTED
BENZODIAZEPINE, UR SCRN: NOT DETECTED
Cannabinoid 50 Ng, Ur ~~LOC~~: NOT DETECTED
Cocaine Metabolite,Ur ~~LOC~~: NOT DETECTED
MDMA (ECSTASY) UR SCREEN: NOT DETECTED
METHADONE SCREEN, URINE: NOT DETECTED
OPIATE, UR SCREEN: NOT DETECTED
Phencyclidine (PCP) Ur S: NOT DETECTED
Tricyclic, Ur Screen: NOT DETECTED

## 2017-08-24 SURGERY — TONSILLECTOMY
Anesthesia: General | Site: Throat | Wound class: Clean Contaminated

## 2017-08-24 MED ORDER — OXYMETAZOLINE HCL 0.05 % NA SOLN
NASAL | Status: AC
  Filled 2017-08-24: qty 15

## 2017-08-24 MED ORDER — MIDAZOLAM HCL 2 MG/2ML IJ SOLN
INTRAMUSCULAR | Status: DC | PRN
  Administered 2017-08-24: 2 mg via INTRAVENOUS

## 2017-08-24 MED ORDER — FAMOTIDINE 20 MG PO TABS
ORAL_TABLET | ORAL | Status: AC
  Filled 2017-08-24: qty 1

## 2017-08-24 MED ORDER — HYDROMORPHONE HCL 1 MG/ML IJ SOLN
INTRAMUSCULAR | Status: AC
  Administered 2017-08-24: 0.25 mg via INTRAVENOUS
  Filled 2017-08-24: qty 1

## 2017-08-24 MED ORDER — ROCURONIUM BROMIDE 100 MG/10ML IV SOLN
INTRAVENOUS | Status: DC | PRN
  Administered 2017-08-24: 40 mg via INTRAVENOUS
  Administered 2017-08-24: 10 mg via INTRAVENOUS

## 2017-08-24 MED ORDER — AMOXICILLIN 400 MG/5ML PO SUSR: ORAL | 0 refills | Status: DC

## 2017-08-24 MED ORDER — ACETAMINOPHEN 160 MG/5ML PO SOLN
325.0000 mg | ORAL | Status: DC | PRN
  Filled 2017-08-24: qty 20.3

## 2017-08-24 MED ORDER — SUGAMMADEX SODIUM 200 MG/2ML IV SOLN
INTRAVENOUS | Status: DC | PRN
  Administered 2017-08-24: 200 mg via INTRAVENOUS

## 2017-08-24 MED ORDER — LACTATED RINGERS IV SOLN
INTRAVENOUS | Status: DC
  Administered 2017-08-24: 07:00:00 via INTRAVENOUS

## 2017-08-24 MED ORDER — OXYMETAZOLINE HCL 0.05 % NA SOLN: NASAL | Status: DC | PRN

## 2017-08-24 MED ORDER — MIDAZOLAM HCL 2 MG/2ML IJ SOLN
INTRAMUSCULAR | Status: AC
  Filled 2017-08-24: qty 2

## 2017-08-24 MED ORDER — PROPOFOL 10 MG/ML IV BOLUS
INTRAVENOUS | Status: DC | PRN
  Administered 2017-08-24: 300 mg via INTRAVENOUS

## 2017-08-24 MED ORDER — FAMOTIDINE 20 MG PO TABS
20.0000 mg | ORAL_TABLET | Freq: Once | ORAL | Status: AC
  Administered 2017-08-24: 20 mg via ORAL

## 2017-08-24 MED ORDER — BUPIVACAINE-EPINEPHRINE (PF) 0.25% -1:200000 IJ SOLN
INTRAMUSCULAR | Status: AC
  Filled 2017-08-24: qty 30

## 2017-08-24 MED ORDER — OXYCODONE HCL 5 MG/5ML PO SOLN
5.0000 mg | Freq: Once | ORAL | Status: AC | PRN
  Administered 2017-08-24: 5 mg via ORAL

## 2017-08-24 MED ORDER — OXYCODONE HCL 5 MG PO TABS: 5.0000 mg | ORAL_TABLET | Freq: Once | ORAL | Status: AC | PRN

## 2017-08-24 MED ORDER — ACETAMINOPHEN 325 MG PO TABS: 325.0000 mg | ORAL_TABLET | ORAL | Status: DC | PRN

## 2017-08-24 MED ORDER — MEPERIDINE HCL 50 MG/ML IJ SOLN: 6.2500 mg | INTRAMUSCULAR | Status: DC | PRN

## 2017-08-24 MED ORDER — FENTANYL CITRATE (PF) 100 MCG/2ML IJ SOLN
INTRAMUSCULAR | Status: AC
  Filled 2017-08-24: qty 2

## 2017-08-24 MED ORDER — BUPIVACAINE-EPINEPHRINE 0.25% -1:200000 IJ SOLN
INTRAMUSCULAR | Status: DC | PRN
  Administered 2017-08-24: 3 mL

## 2017-08-24 MED ORDER — ONDANSETRON HCL 4 MG/2ML IJ SOLN
INTRAMUSCULAR | Status: DC | PRN
  Administered 2017-08-24: 4 mg via INTRAVENOUS

## 2017-08-24 MED ORDER — DEXAMETHASONE SODIUM PHOSPHATE 10 MG/ML IJ SOLN
INTRAMUSCULAR | Status: DC | PRN
  Administered 2017-08-24: 10 mg via INTRAVENOUS

## 2017-08-24 MED ORDER — HYDROMORPHONE HCL 1 MG/ML IJ SOLN
0.2500 mg | INTRAMUSCULAR | Status: DC | PRN
  Administered 2017-08-24 (×2): 0.25 mg via INTRAVENOUS
  Administered 2017-08-24: 0.5 mg via INTRAVENOUS

## 2017-08-24 MED ORDER — OXYCODONE HCL 5 MG/5ML PO SOLN
ORAL | Status: AC
  Filled 2017-08-24: qty 5

## 2017-08-24 MED ORDER — PROMETHAZINE HCL 25 MG/ML IJ SOLN: 6.2500 mg | INTRAMUSCULAR | Status: DC | PRN

## 2017-08-24 MED ORDER — PROPOFOL 10 MG/ML IV BOLUS
INTRAVENOUS | Status: AC
  Filled 2017-08-24: qty 20

## 2017-08-24 MED ORDER — PREDNISOLONE SODIUM PHOSPHATE 15 MG/5ML PO SOLN: ORAL | 0 refills | Status: DC

## 2017-08-24 MED ORDER — LIDOCAINE HCL (CARDIAC) PF 100 MG/5ML IV SOSY
PREFILLED_SYRINGE | INTRAVENOUS | Status: DC | PRN
  Administered 2017-08-24: 100 mg via INTRAVENOUS

## 2017-08-24 MED ORDER — HYDROCODONE-ACETAMINOPHEN 7.5-325 MG/15ML PO SOLN: ORAL | 0 refills | Status: DC

## 2017-08-24 MED ORDER — FENTANYL CITRATE (PF) 100 MCG/2ML IJ SOLN
INTRAMUSCULAR | Status: DC | PRN
  Administered 2017-08-24: 50 ug via INTRAVENOUS
  Administered 2017-08-24: 100 ug via INTRAVENOUS
  Administered 2017-08-24: 50 ug via INTRAVENOUS

## 2017-08-24 SURGICAL SUPPLY — 16 items
CANISTER SUCT 1200ML W/VALVE (MISCELLANEOUS) ×3 IMPLANT
CATH ROBINSON RED A/P 10FR (CATHETERS) ×3 IMPLANT
COAG SUCT 10F 3.5MM HAND CTRL (MISCELLANEOUS) ×3 IMPLANT
ELECT REM PT RETURN 9FT ADLT (ELECTROSURGICAL) ×3
ELECTRODE REM PT RTRN 9FT ADLT (ELECTROSURGICAL) ×1 IMPLANT
GLOVE BIO SURGEON STRL SZ7.5 (GLOVE) ×3 IMPLANT
GOWN STRL REUS W/ TWL LRG LVL3 (GOWN DISPOSABLE) ×2 IMPLANT
GOWN STRL REUS W/TWL LRG LVL3 (GOWN DISPOSABLE) ×6
KIT TURNOVER KIT A (KITS) ×3 IMPLANT
LABEL OR SOLS (LABEL) ×3 IMPLANT
NS IRRIG 500ML POUR BTL (IV SOLUTION) ×3 IMPLANT
PACK HEAD/NECK (MISCELLANEOUS) ×3 IMPLANT
PENCIL ELECTRO HAND CTR (MISCELLANEOUS) ×3 IMPLANT
SOL ANTI-FOG 6CC FOG-OUT (MISCELLANEOUS) ×1 IMPLANT
SOL FOG-OUT ANTI-FOG 6CC (MISCELLANEOUS) ×2
SPONGE TONSIL 1 RF SGL (DISPOSABLE) ×3 IMPLANT

## 2017-08-24 NOTE — Op Note (Signed)
08/24/2017  8:05 AM    Elwin MochaJason W Gal  [id number]   Pre-Op Diagnosis:  TONSILLOLITHIASIS  Post-op Diagnosis: TONSILLOLITHIASIS, CHRONIC TONSILLITIS  Procedure: Tonsillectomy  Surgeon:  Sandi MealyBennett, Henry Demeritt S., MD  Anesthesia:  General endotracheal  EBL:  Less than 25 cc  Complications:  None  Findings: 2+ cryptic tonsils with tonsillith debris. No significant adenoid tissue  Procedure: The patient was taken to the Operating Room and placed in the supine position.  After induction of general endotracheal anesthesia, the table was turned 90 degrees and the patient was draped in the usual fashion  with the eyes protected.  A mouth gag was inserted into the oral cavity to open the mouth, and examination of the oropharynx showed the uvula was non-bifid. The palate was palpated, and there was no evidence of submucous cleft. Examination of the nasopharynx showed no obstructing adenoids. The right tonsil was grasped with an Allis clamp and resected from the tonsillar fossa in the usual fashion with the Bovie. The left tonsil was resected in the same fashion. The Bovie was used to obtain hemostasis. Each tonsillar fossa was then carefully injected with 0.25% marcaine with epinephrine, 1:200,000, avoiding intravascular injection. The nose and throat were irrigated and suctioned to remove any  blood clot. The mouth gag was  removed with no evidence of active bleeding.  The patient was then returned to the anesthesiologist for awakening, and was taken to the Recovery Room in stable condition.  Cultures:  None.  Specimens:  Tonsils.  Disposition:   PACU to home  Plan: Soft, bland diet and push fluids. Take pain medications and antibiotics as prescribed. No strenuous activity for 2 weeks. Follow-up in 3 weeks.  Sandi Mealyaul S Lucindia Lemley 08/24/2017 8:05 AM

## 2017-08-24 NOTE — Anesthesia Post-op Follow-up Note (Signed)
Anesthesia QCDR form completed.        

## 2017-08-24 NOTE — Anesthesia Preprocedure Evaluation (Addendum)
Anesthesia Evaluation  Patient identified by MRN, date of birth, ID band Patient awake    Reviewed: Allergy & Precautions, H&P , NPO status , reviewed documented beta blocker date and time   Airway Mallampati: II  TM Distance: >3 FB Neck ROM: full    Dental  (+) Poor Dentition, Chipped, Dental Advidsory Given   Pulmonary shortness of breath, pneumonia, resolved, Current Smoker,    Pulmonary exam normal        Cardiovascular Normal cardiovascular exam  Study Conclusions 2016  - Left ventricle: The cavity size was normal. Systolic function was   normal. The estimated ejection fraction was 60%. Regional wall   motion abnormalities cannot be excluded. Doppler parameters are   consistent with abnormal left ventricular relaxation (grade 1   diastolic dysfunction). - Left atrium: The atrium was mildly dilated. - Right atrium: The atrium was mildly dilated. - Atrial septum: No defect or patent foramen ovale was identified.  Impressions:  - Normal LVEF with grade 1 diastolic dysfunction.   Neuro/Psych  Headaches, PSYCHIATRIC DISORDERS Anxiety Depression Bipolar Disorder    GI/Hepatic GERD  ,  Endo/Other  Hypothyroidism Morbid obesity  Renal/GU      Musculoskeletal   Abdominal   Peds  Hematology   Anesthesia Other Findings Past Medical History: No date: Bipolar 1 disorder (HCC) No date: Depression No date: GERD (gastroesophageal reflux disease) No date: Headache     Comment:  migraines No date: History of kidney stones No date: Hypothyroidism No date: PTSD (post-traumatic stress disorder) No date: Suicide attempt [facility](HCC)     Comment:  several attempts No date: Thyroid disease History reviewed. No pertinent surgical history. BMI    Body Mass Index:  48.55 kg/m     Reproductive/Obstetrics                            Anesthesia Physical Anesthesia Plan  ASA: III  Anesthesia Plan:  General ETT   Post-op Pain Management:    Induction:   PONV Risk Score and Plan: 3 and Ondansetron, Treatment may vary due to age or medical condition, Midazolam, Dexamethasone and Metaclopromide  Airway Management Planned:   Additional Equipment:   Intra-op Plan:   Post-operative Plan:   Informed Consent: I have reviewed the patients History and Physical, chart, labs and discussed the procedure including the risks, benefits and alternatives for the proposed anesthesia with the patient or authorized representative who has indicated his/her understanding and acceptance.   Dental Advisory Given  Plan Discussed with: CRNA  Anesthesia Plan Comments:         Anesthesia Quick Evaluation

## 2017-08-24 NOTE — H&P (Signed)
History and physical reviewed and will be scanned in later. No change in medical status reported by the patient or family, appears stable for surgery. All questions regarding the procedure answered, and patient (or family if a child) expressed understanding of the procedure. ? ?[name] S [name] ?@[email or web address]@ ?

## 2017-08-24 NOTE — Discharge Instructions (Signed)
AMBULATORY SURGERY  °DISCHARGE INSTRUCTIONS ° ° °1) The drugs that you were given will stay in your system until tomorrow so for the next 24 hours you should not: ° °A) Drive an automobile °B) Make any legal decisions °C) Drink any alcoholic beverage ° ° °2) You may resume regular meals tomorrow.  Today it is better to start with liquids and gradually work up to solid foods. ° °You may eat anything you prefer, but it is better to start with liquids, then soup and crackers, and gradually work up to solid foods. ° ° °3) Please notify your doctor immediately if you have any unusual bleeding, trouble breathing, redness and pain at the surgery site, drainage, fever, or pain not relieved by medication. ° ° ° °4) Additional Instructions: ° ° ° ° ° ° ° °Please contact your physician with any problems or Same Day Surgery at [phone], Monday through Friday 6 am to 4 pm, or [place] at [facility] [employer] Main number at [phone]. °

## 2017-08-24 NOTE — Anesthesia Procedure Notes (Signed)
Procedure Name: Intubation Date/Time: 08/24/2017 7:41 AM Performed by: Philbert Riser, CRNA Pre-anesthesia Checklist: Patient identified, Emergency Drugs available, Suction available, Patient being monitored and Timeout performed Patient Re-evaluated:Patient Re-evaluated prior to induction Oxygen Delivery Method: Circle system utilized and Simple face mask Preoxygenation: Pre-oxygenation with 100% oxygen Induction Type: IV induction Ventilation: Mask ventilation without difficulty Laryngoscope Size: Mac and 3 Grade View: Grade I Tube type: Oral Rae Tube size: 8.0 mm Number of attempts: 1 Airway Equipment and Method: Stylet Placement Confirmation: ETT inserted through vocal cords under direct vision,  positive ETCO2 and breath sounds checked- equal and bilateral Secured at: 20 cm Tube secured with: Tape Dental Injury: Teeth and Oropharynx as per pre-operative assessment

## 2017-08-24 NOTE — Transfer of Care (Signed)
Immediate Anesthesia Transfer of Care Note  Patient: Clinton MochaJason W Rios  Procedure(s) Performed: TONSILLECTOMY (N/A Throat)  Patient Location: PACU  Anesthesia Type:General  Level of Consciousness: awake, alert  and oriented  Airway & Oxygen Therapy: Patient Spontanous Breathing and Patient connected to face mask oxygen  Post-op Assessment: Report given to RN and Post -op Vital signs reviewed and stable  Post vital signs: Reviewed and stable  Last Vitals:  Vitals Value Taken Time  BP 145/77 08/24/2017  8:17 AM  Temp    Pulse 87 08/24/2017  8:19 AM  Resp 21 08/24/2017  8:19 AM  SpO2 99 % 08/24/2017  8:19 AM  Vitals shown include unvalidated device data.  Last Pain:  Vitals:   08/24/17 0617  TempSrc: Tympanic  PainSc: 0-No pain         Complications: No apparent anesthesia complications

## 2017-08-24 NOTE — Anesthesia Postprocedure Evaluation (Signed)
Anesthesia Post Note  Patient: [name]  Procedure(s) Performed: TONSILLECTOMY (N/A Throat)  Patient location during evaluation: PACU Anesthesia Type: General Level of consciousness: awake and alert Pain management: pain level controlled Vital Signs Assessment: post-procedure vital signs reviewed and stable Respiratory status: spontaneous breathing, nonlabored ventilation and respiratory function stable Cardiovascular status: blood pressure returned to baseline and stable Postop Assessment: no apparent nausea or vomiting Anesthetic complications: no     Last Vitals:  Vitals:   08/24/17 0842 08/24/17 0847  BP: 126/85 (!) 147/92  Pulse: 86 78  Resp: 15 16  Temp:  36.7 C  SpO2: 97% 97%    Last Pain:  Vitals:   08/24/17 0858  TempSrc:   PainSc: 5                  [name] [name] [name]

## 2017-08-25 LAB — SURGICAL PATHOLOGY

## 2018-04-16 ENCOUNTER — Other Ambulatory Visit: Payer: Self-pay

## 2018-04-16 ENCOUNTER — Emergency Department
Admission: EM | Admit: 2018-04-16 | Discharge: 2018-04-16 | Disposition: A | Discharge status: Home / Self Care | Payer: Federal, State, Local not specified - PPO | Attending: Emergency Medicine | Admitting: Emergency Medicine

## 2018-04-16 ENCOUNTER — Emergency Department: Payer: Federal, State, Local not specified - PPO

## 2018-04-16 DIAGNOSIS — K219 Gastro-esophageal reflux disease without esophagitis: Secondary | ICD-10-CM | POA: Diagnosis not present

## 2018-04-16 DIAGNOSIS — N200 Calculus of kidney: Secondary | ICD-10-CM | POA: Diagnosis not present

## 2018-04-16 DIAGNOSIS — Z79899 Other long term (current) drug therapy: Secondary | ICD-10-CM | POA: Insufficient documentation

## 2018-04-16 DIAGNOSIS — F17210 Nicotine dependence, cigarettes, uncomplicated: Secondary | ICD-10-CM | POA: Insufficient documentation

## 2018-04-16 DIAGNOSIS — R1032 Left lower quadrant pain: Secondary | ICD-10-CM | POA: Diagnosis present

## 2018-04-16 LAB — COMPREHENSIVE METABOLIC PANEL
ALBUMIN: 4.6 g/dL (ref 3.5–5.0)
ALK PHOS: 52 U/L (ref 38–126)
ALT: 37 U/L (ref 0–44)
AST: 34 U/L (ref 15–41)
Anion gap: 9 (ref 5–15)
BILIRUBIN TOTAL: 0.8 mg/dL (ref 0.3–1.2)
BUN: 16 mg/dL (ref 6–20)
CHLORIDE: 104 mmol/L (ref 98–111)
CO2: 26 mmol/L (ref 22–32)
Calcium: 8.8 mg/dL — ABNORMAL LOW (ref 8.9–10.3)
Creatinine, Ser: 1.01 mg/dL (ref 0.61–1.24)
GLUCOSE: 113 mg/dL — AB (ref 70–99)
POTASSIUM: 3.4 mmol/L — AB (ref 3.5–5.1)
Sodium: 139 mmol/L (ref 135–145)
Total Protein: 7.2 g/dL (ref 6.5–8.1)

## 2018-04-16 LAB — CBC
HEMATOCRIT: 42.6 % (ref 39.0–52.0)
HEMOGLOBIN: 14.1 g/dL (ref 13.0–17.0)
MCH: 29.4 pg (ref 26.0–34.0)
MCHC: 33.1 g/dL (ref 30.0–36.0)
MCV: 88.9 fL (ref 80.0–100.0)
Platelets: 229 10*3/uL (ref 150–400)
RBC: 4.79 MIL/uL (ref 4.22–5.81)
RDW: 13.3 % (ref 11.5–15.5)
WBC: 13 10*3/uL — ABNORMAL HIGH (ref 4.0–10.5)
nRBC: 0 % (ref 0.0–0.2)

## 2018-04-16 LAB — URINALYSIS, COMPLETE (UACMP) WITH MICROSCOPIC
BACTERIA UA: NONE SEEN
Bilirubin Urine: NEGATIVE
GLUCOSE, UA: NEGATIVE mg/dL
KETONES UR: NEGATIVE mg/dL
Leukocytes, UA: NEGATIVE
Nitrite: NEGATIVE
PROTEIN: NEGATIVE mg/dL
Specific Gravity, Urine: 1.034 — ABNORMAL HIGH (ref 1.005–1.030)
pH: 5 (ref 5.0–8.0)

## 2018-04-16 LAB — LIPASE, BLOOD: Lipase: 27 U/L (ref 11–51)

## 2018-04-16 MED ORDER — ONDANSETRON 4 MG PO TBDP: 4.0000 mg | ORAL_TABLET | Freq: Four times a day (QID) | ORAL | 0 refills | Status: DC | PRN

## 2018-04-16 MED ORDER — ONDANSETRON HCL 4 MG/2ML IJ SOLN
4.0000 mg | Freq: Once | INTRAMUSCULAR | Status: AC
  Administered 2018-04-16: 4 mg via INTRAVENOUS
  Filled 2018-04-16: qty 2

## 2018-04-16 MED ORDER — KETOROLAC TROMETHAMINE 30 MG/ML IJ SOLN
30.0000 mg | Freq: Once | INTRAMUSCULAR | Status: AC
  Administered 2018-04-16: 30 mg via INTRAVENOUS
  Filled 2018-04-16: qty 1

## 2018-04-16 MED ORDER — MORPHINE SULFATE (PF) 4 MG/ML IV SOLN
4.0000 mg | Freq: Once | INTRAVENOUS | Status: AC
  Administered 2018-04-16: 4 mg via INTRAVENOUS
  Filled 2018-04-16: qty 1

## 2018-04-16 MED ORDER — MORPHINE SULFATE (PF) 4 MG/ML IV SOLN
6.0000 mg | Freq: Once | INTRAVENOUS | Status: AC
  Administered 2018-04-16: 6 mg via INTRAVENOUS
  Filled 2018-04-16: qty 2

## 2018-04-16 NOTE — ED Triage Notes (Signed)
Pt c/o left abd pain that radiates into back - c/o pressure in groin area - difficulty with urination x1 day - denies N/V

## 2018-04-16 NOTE — ED Notes (Signed)
Patient transported to CT 

## 2018-04-16 NOTE — ED Provider Notes (Signed)
[facility] Emergency Department Provider Note   ____________________________________________   First MD Initiated Contact with Patient 04/16/18 1131     (approximate)  I have reviewed the triage vital signs and the nursing notes.   HISTORY  Chief Complaint Abdominal Pain and Groin Pain    HPI [name] is a 28 y.o. male here for evaluation of left lower abdominal pain and left flank pain  Patient reports he got up this morning, after getting up he started noticing that it felt like he had an urgency to urinate and then shortly thereafter began experiencing a sharp pain in his left lower flank.  Reports he had the same with a "kidney stone" in the past.  Patient has experienced some nausea but no vomiting.  Feeling like some chills and little bit of sweating due to the pain as he describes it.  No shortness of breath.  No headache.  No vomiting.  No bowel movements or diarrhea. Denies pain in the testicle or penis.  No abnormal urine odor or burning with urination, rather reports it feels like he just needs to urinate.  Has been able to go bit multiple times in very small quantities.  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     Past Medical History:  Diagnosis Date  . Bipolar 1 disorder (HCC)   . Depression   . GERD (gastroesophageal reflux disease)   . Headache    migraines  . History of kidney stones   . Hypothyroidism   . PTSD (post-traumatic stress disorder)   . Suicide attempt [facility](HCC)    several attempts  . Thyroid disease     Patient Active Problem List   Diagnosis Date Noted  . Vomiting 11/27/2014  . Dyspnea 11/26/2014  . Leucocytosis 11/26/2014  . Autism 11/16/2014  . Bipolar 1 disorder (HCC) 11/16/2014  . ARDS (adult respiratory distress syndrome) (HCC) 11/16/2014  . Severe sepsis with acute organ dysfunction (HCC) 11/16/2014  . Aspiration pneumonia (HCC)  11/15/2014  . Overdose   . Acute respiratory failure with hypoxia (HCC) 11/13/2014    Past Surgical History:  Procedure Laterality Date  . TONSILLECTOMY N/A 08/24/2017   Procedure: TONSILLECTOMY;  Surgeon: Geanie LoganBennett, Paul, MD;  Location: [facility] ORS;  Service: ENT;  Laterality: N/A;    Prior to Admission medications   Medication Sig Start Date End Date Taking? Authorizing Provider  amoxicillin (AMOXIL) 400 MG/5ML suspension 2 1/4 teaspoons PO BID x 10 days 08/24/17   Geanie LoganBennett, Paul, MD  bisacodyl (BISACODYL LAXATIVE) 5 MG EC tablet Take 1 tablet (5 mg total) by mouth daily as needed for moderate constipation. 03/09/17   Cuthriell, Delorise RoyalsJonathan D, PA-C  Buprenorphine HCl-Naloxone HCl (SUBOXONE) 8-2 MG FILM Place 0.5-1 Film under the tongue See admin instructions. Dissolve 1 film under tongue in the morning, dissolve 0.5 film under tongue in the afternoon and dissolve 1 film under tongue at bedtime    [provider]  citalopram (CELEXA) 40 MG tablet Take 40 mg by mouth daily.    [provider]  cloNIDine (CATAPRES - DOSED IN MG/24 HR) 0.1 mg/24hr patch Place 0.1 mg onto the skin every Sunday.    [provider]  docusate sodium (COLACE) 100 MG capsule Take 2 capsules (200 mg total) by mouth 2 (two) times daily. Patient not taking: Reported on 08/12/2017 02/18/16   Sharman CheekStafford, Phillip, MD  ergocalciferol (VITAMIN D2) 50000  units capsule Take 50,000 Units by mouth every Sunday.    [provider]  flavoxATE (URISPAS) 100 MG tablet Take 1 tablet (100 mg total) by mouth 3 (three) times daily as needed for bladder spasms. Patient not taking: Reported on 08/12/2017 06/25/16   Merrily Brittleifenbark, Neil, MD  HYDROcodone-acetaminophen (HYCET) 7.5-325 mg/15 ml solution 10-15cc PO every 4-6 hours as needed for pain 08/24/17   Geanie LoganBennett, Paul, MD  hydrocortisone cream 1 % Apply 1 application topically 3 (three) times daily as needed for itching.    [provider]  lactulose (CEPHULAC) 10 g  packet Take 1 packet (10 g total) by mouth 3 (three) times daily. Patient not taking: Reported on 08/12/2017 03/09/17   Cuthriell, Delorise RoyalsJonathan D, PA-C  lamoTRIgine (LAMICTAL) 200 MG tablet Take 200 mg by mouth 2 (two) times daily.     [provider]  levothyroxine (SYNTHROID, LEVOTHROID) 75 MCG tablet Take 75 mcg by mouth daily before breakfast.    [provider]  lidocaine (XYLOCAINE) 5 % ointment Apply 1 application topically as needed. Patient taking differently: Apply 1 application topically daily as needed for moderate pain.  03/09/17   Cuthriell, Delorise RoyalsJonathan D, PA-C  ondansetron (ZOFRAN ODT) 4 MG disintegrating tablet Take 1 tablet (4 mg total) by mouth every 6 (six) hours as needed for nausea or vomiting. 04/16/18   Sharyn CreamerQuale, Pepper Wyndham, MD  pantoprazole (PROTONIX) 40 MG tablet Take 40 mg by mouth daily.    [provider]  polyethylene glycol (MIRALAX) packet Take 17 g by mouth daily as needed. Patient not taking: Reported on 08/12/2017 08/24/16   Darci CurrentBrown, [place] N, MD  polyethylene glycol powder [facility](GLYCOLAX/MIRALAX) powder 2 cap fulls in a full glass of water, three times a day, for 5 days. Patient not taking: Reported on 08/12/2017 02/18/16   Sharman CheekStafford, Phillip, MD  prednisoLONE (ORAPRED) 15 MG/5ML solution 10cc PO BID x 3 days, then 5cc PO BID x 3 days, then 5cc PO QD x 3 days 08/24/17   Geanie LoganBennett, Paul, MD  QUEtiapine (SEROQUEL) 100 MG tablet Take one tab in am and two tabs in the PM Patient not taking: Reported on 11/27/2014 11/25/14   Alford HighlandWieting, Richard, MD  rizatriptan (MAXALT) 10 MG tablet Take 10 mg by mouth as needed for migraine. May repeat in 2 hours if needed    [provider]  senna (SENOKOT) 8.6 MG TABS tablet Take 2 tablets (17.2 mg total) by mouth 2 (two) times daily. Patient not taking: Reported on 08/12/2017 02/18/16   Sharman CheekStafford, Phillip, MD  Sodium Bicarbonate (AFTER BITE) 5 % LIQD Apply 1 application topically 3 (three) times daily as needed (for itching).    [provider]    Allergies Sulfa antibiotics  Family History  Problem Relation Age of Onset  . Breast cancer Mother     Social History Social History   Tobacco Use  . Smoking status: Current Some Day Smoker    Packs/day: 1.00    Types: Cigarettes  . Smokeless tobacco: Never Used  Substance Use Topics  . Alcohol use: No    Comment: hx of alcohol abuse - none for 1 year  . 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 ENT: No sore throat. Cardiovascular: Denies chest pain. Respiratory: Denies shortness of breath. Gastrointestinal: See HPI.  No right-sided or upper abdominal pain. Genitourinary: Negative for dysuria.  See HPI.  Musculoskeletal: Negative for back pain. Skin: Negative for rash. Neurological: Negative for headaches, areas of focal weakness or numbness.    ____________________________________________   PHYSICAL EXAM:  VITAL SIGNS: ED Triage Vitals  Enc Vitals Group     BP 04/16/18 1105 140/85     Pulse Rate 04/16/18 1105 96     Resp 04/16/18 1105 (!) 24     Temp 04/16/18 1105 97.7 F (36.5 C)     Temp Source 04/16/18 1105 Oral     SpO2 04/16/18 1105 96 %     Weight 04/16/18 1103 300 lb (136.1 kg)     Height 04/16/18 1103 5\' 7"  (1.702 m)     Head Circumference --      Peak Flow --      Pain Score 04/16/18 1103 8     Pain Loc --      Pain Edu? --      Excl. in GC? --     Constitutional: Alert and oriented.  Sitting upright, appears in pain, somewhat hunched forward Eyes: Conjunctivae are normal. Head: Atraumatic. Nose: No congestion/rhinnorhea. Mouth/Throat: Mucous membranes are moist. Neck: No stridor.  Cardiovascular: Normal rate, regular rhythm. Grossly normal heart sounds.  Good peripheral circulation. Respiratory: Normal respiratory effort.  No retractions. Lungs CTAB. Gastrointestinal: Soft and nontender except  for some left flank discomfort and mild left CVA tenderness. No distention.  Normal circumcised penis and testicles/scrotum are not swollen or tender. Musculoskeletal: No lower extremity tenderness nor edema. Neurologic:  Normal speech and language. No gross focal neurologic deficits are appreciated.  Skin:  Skin is warm, dry and intact. No rash noted. Psychiatric: Mood and affect are normal. Speech and behavior are normal.  ____________________________________________   LABS (all labs ordered are listed, but only abnormal results are displayed)  Labs Reviewed  COMPREHENSIVE METABOLIC PANEL - Abnormal; Notable for the following components:      Result Value   Potassium 3.4 (*)    Glucose, Bld 113 (*)    Calcium 8.8 (*)    All other components within normal limits  CBC - Abnormal; Notable for the following components:   WBC 13.0 (*)    All other components within normal limits  URINALYSIS, COMPLETE (UACMP) WITH MICROSCOPIC - Abnormal; Notable for the following components:   Color, Urine YELLOW (*)    APPearance CLEAR (*)    Specific Gravity, Urine 1.034 (*)    Hgb urine dipstick MODERATE (*)    RBC / HPF >50 (*)    All other components within normal limits  URINE CULTURE  LIPASE, BLOOD   ____________________________________________  EKG   ____________________________________________  RADIOLOGY  IMPRESSION: Small obstructing stone at the left ureterovesical junction with mild left-sided hydronephrosis. There is associated mild inflammatory changes and if there is concern for ascending urinary tract infection, recommend correlation with urinalysis.  Nonobstructive right-sided nephrolithiasis.  Steatosis.   CT imaging reviewed by me ____________________________________________   PROCEDURES  Procedure(s) performed: None  Procedures  Critical Care performed: No  ____________________________________________   INITIAL IMPRESSION / ASSESSMENT AND PLAN / ED  COURSE  Pertinent labs & imaging results that were available during my care of the patient were reviewed by me and considered in my medical decision making (see chart for details).   Differential diagnosis includes but is not limited to, abdominal perforation, aortic dissection, cholecystitis, appendicitis, diverticulitis, colitis, esophagitis/gastritis, kidney stone, pyelonephritis, urinary tract infection, aortic aneurysm. All are considered in decision and treatment plan. Based upon the patient's presentation and  risk factors, suspect likely kidney stone given the patient's presentation.  Afebrile, no obvious infectious symptoms.  ----------------------------------------- 2:04 PM on 04/16/2018 -----------------------------------------  Patient ambulatory, pain much better.  He is agreeable to treatment with over-the-counter pain medication such as ibuprofen given his previous history.  Discussed careful return precautions.  Urinalysis demonstrates no evidence of bacteriuria.  Patient much improved.  Will discharge with Zofran, follow-up with Dr. Sheppard Penton whom he has seen previous for urology.  Return precautions and treatment recommendations and follow-up discussed with the patient who is agreeable with the plan.        ____________________________________________   FINAL CLINICAL IMPRESSION(S) / ED DIAGNOSES  Final diagnoses:  Kidney stone on left side        Note:  This document was prepared using Dragon voice recognition software and may include unintentional dictation errors       Sharyn Creamer, MD 04/16/18 1405

## 2018-04-16 NOTE — Discharge Instructions (Signed)
You have been seen in the Emergency Department (ED) today for pain that we believe based on your workup, is caused by kidney stones.  As we have discussed, please drink plenty of fluids.  Please make a follow up appointment with the physician(s) listed elsewhere in this documentation.  No driving today as you were given morphine.  Please see your doctor as soon as possible as stones may take 1-3 weeks to pass and you may require additional care or medications.  Return to the Emergency Department (ED) or call your doctor if you have any worsening pain, fever, painful urination, are unable to urinate, or develop other symptoms that concern you.

## 2018-04-18 LAB — URINE CULTURE: SPECIAL REQUESTS: NORMAL

## 2019-07-29 ENCOUNTER — Emergency Department
Admission: EM | Admit: 2019-07-29 | Discharge: 2019-07-29 | Disposition: A | Discharge status: Home / Self Care | Payer: Federal, State, Local not specified - PPO | Attending: Emergency Medicine | Admitting: Emergency Medicine

## 2019-07-29 ENCOUNTER — Emergency Department: Payer: Federal, State, Local not specified - PPO

## 2019-07-29 ENCOUNTER — Other Ambulatory Visit: Payer: Self-pay

## 2019-07-29 DIAGNOSIS — N23 Unspecified renal colic: Secondary | ICD-10-CM | POA: Insufficient documentation

## 2019-07-29 DIAGNOSIS — R109 Unspecified abdominal pain: Secondary | ICD-10-CM

## 2019-07-29 DIAGNOSIS — E039 Hypothyroidism, unspecified: Secondary | ICD-10-CM | POA: Insufficient documentation

## 2019-07-29 DIAGNOSIS — F17210 Nicotine dependence, cigarettes, uncomplicated: Secondary | ICD-10-CM | POA: Diagnosis not present

## 2019-07-29 DIAGNOSIS — Z79899 Other long term (current) drug therapy: Secondary | ICD-10-CM | POA: Insufficient documentation

## 2019-07-29 LAB — CBC WITH DIFFERENTIAL/PLATELET
Abs Immature Granulocytes: 0.07 10*3/uL (ref 0.00–0.07)
Basophils Absolute: 0.1 10*3/uL (ref 0.0–0.1)
Basophils Relative: 1 %
Eosinophils Absolute: 0.2 10*3/uL (ref 0.0–0.5)
Eosinophils Relative: 1 %
HCT: 49 % (ref 39.0–52.0)
Hemoglobin: 16.4 g/dL (ref 13.0–17.0)
Immature Granulocytes: 0 %
Lymphocytes Relative: 22 %
Lymphs Abs: 3.8 10*3/uL (ref 0.7–4.0)
MCH: 30.1 pg (ref 26.0–34.0)
MCHC: 33.5 g/dL (ref 30.0–36.0)
MCV: 89.9 fL (ref 80.0–100.0)
Monocytes Absolute: 1.3 10*3/uL — ABNORMAL HIGH (ref 0.1–1.0)
Monocytes Relative: 8 %
Neutro Abs: 11.4 10*3/uL — ABNORMAL HIGH (ref 1.7–7.7)
Neutrophils Relative %: 68 %
Platelets: 279 10*3/uL (ref 150–400)
RBC: 5.45 MIL/uL (ref 4.22–5.81)
RDW: 13.4 % (ref 11.5–15.5)
WBC: 16.8 10*3/uL — ABNORMAL HIGH (ref 4.0–10.5)
nRBC: 0 % (ref 0.0–0.2)

## 2019-07-29 LAB — URINALYSIS, COMPLETE (UACMP) WITH MICROSCOPIC
Bilirubin Urine: NEGATIVE
Glucose, UA: NEGATIVE mg/dL
Ketones, ur: NEGATIVE mg/dL
Leukocytes,Ua: NEGATIVE
Nitrite: NEGATIVE
Protein, ur: 100 mg/dL — AB
RBC / HPF: >50 RBC/hpf — ABNORMAL HIGH (ref 0–5)
Specific Gravity, Urine: 1.032 — ABNORMAL HIGH (ref 1.005–1.030)
pH: 5 (ref 5.0–8.0)

## 2019-07-29 LAB — BASIC METABOLIC PANEL
Anion gap: 12 (ref 5–15)
BUN: 14 mg/dL (ref 6–20)
CO2: 22 mmol/L (ref 22–32)
Calcium: 8.9 mg/dL (ref 8.9–10.3)
Chloride: 107 mmol/L (ref 98–111)
Creatinine, Ser: 0.91 mg/dL (ref 0.61–1.24)
GFR calc Af Amer: >60 mL/min (ref >60)
GFR calc non Af Amer: >60 mL/min (ref >60)
Glucose, Bld: 122 mg/dL — ABNORMAL HIGH (ref 70–99)
Potassium: 3.3 mmol/L — ABNORMAL LOW (ref 3.5–5.1)
Sodium: 141 mmol/L (ref 135–145)

## 2019-07-29 LAB — LIPASE, BLOOD: Lipase: 25 U/L (ref 11–51)

## 2019-07-29 MED ORDER — SODIUM CHLORIDE 0.9 % IV BOLUS
1000.0000 mL | Freq: Once | INTRAVENOUS | Status: AC
  Administered 2019-07-29: 1000 mL via INTRAVENOUS

## 2019-07-29 MED ORDER — IBUPROFEN 800 MG PO TABS: 800.0000 mg | ORAL_TABLET | Freq: Three times a day (TID) | ORAL | 0 refills | Status: AC | PRN

## 2019-07-29 MED ORDER — HYDROMORPHONE HCL 1 MG/ML IJ SOLN
1.0000 mg | Freq: Once | INTRAMUSCULAR | Status: AC
  Administered 2019-07-29: 1 mg via INTRAVENOUS
  Filled 2019-07-29: qty 1

## 2019-07-29 MED ORDER — OXYCODONE-ACETAMINOPHEN 5-325 MG PO TABS
2.0000 | ORAL_TABLET | Freq: Once | ORAL | Status: AC
  Administered 2019-07-29: 2 via ORAL
  Filled 2019-07-29: qty 2

## 2019-07-29 MED ORDER — ONDANSETRON 4 MG PO TBDP
4.0000 mg | ORAL_TABLET | Freq: Three times a day (TID) | ORAL | 0 refills | Status: DC | PRN
Start: 2019-07-29 — End: 2021-01-25

## 2019-07-29 MED ORDER — ONDANSETRON HCL 4 MG/2ML IJ SOLN
4.0000 mg | Freq: Once | INTRAMUSCULAR | Status: AC
  Administered 2019-07-29: 4 mg via INTRAVENOUS
  Filled 2019-07-29: qty 2

## 2019-07-29 MED ORDER — OXYCODONE-ACETAMINOPHEN 5-325 MG PO TABS: 1.0000 | ORAL_TABLET | ORAL | 0 refills | Status: DC | PRN

## 2019-07-29 MED ORDER — KETOROLAC TROMETHAMINE 30 MG/ML IJ SOLN
15.0000 mg | Freq: Once | INTRAMUSCULAR | Status: AC
  Administered 2019-07-29: 15 mg via INTRAVENOUS
  Filled 2019-07-29: qty 1

## 2019-07-29 NOTE — ED Notes (Signed)
Pt given urine strainer, as per provider

## 2019-07-29 NOTE — Discharge Instructions (Signed)
1. Take pain & nausea medicines as needed (Motrin, Percocet/Zofran #30). Make sure to take a stool softener while taking narcotic pain medicines. 2. Drink plenty of bottled or filtered water daily. 3. Return to the ER for worsening symptoms, persistent vomiting, fever, difficulty breathing or other concerns.

## 2019-07-29 NOTE — ED Provider Notes (Signed)
[facility] Emergency Department Provider Note   ____________________________________________   First MD Initiated Contact with Patient 07/29/19 0423     (approximate)  I have reviewed the triage vital signs and the nursing notes.   HISTORY  Chief Complaint Flank Pain (Kidney stones)    HPI [name] is a 29 y.o. male who presents to the ED from home with a chief complaint of right flank pain.  History of kidney stones and states this pain feels similarly.  Pain began 4 days ago, waxing and waning.  Pain associated with nausea/vomiting and hematuria.  Radiation to his testicles.  Patient denies fever, cough, chest pain, shortness of breath.       Past Medical History:  Diagnosis Date  . Bipolar 1 disorder (HCC)   . Depression   . GERD (gastroesophageal reflux disease)   . Headache    migraines  . History of kidney stones   . Hypothyroidism   . PTSD (post-traumatic stress disorder)   . Suicide attempt [facility])    several attempts  . Thyroid disease     Patient Active Problem List   Diagnosis Date Noted  . Vomiting 11/27/2014  . Dyspnea 11/26/2014  . Leucocytosis 11/26/2014  . Autism 11/16/2014  . Bipolar 1 disorder (HCC) 11/16/2014  . ARDS (adult respiratory distress syndrome) (HCC) 11/16/2014  . Severe sepsis with acute organ dysfunction (HCC) 11/16/2014  . Aspiration pneumonia (HCC) 11/15/2014  . Overdose   . Acute respiratory failure with hypoxia (HCC) 11/13/2014    Past Surgical History:  Procedure Laterality Date  . TONSILLECTOMY N/A 08/24/2017   Procedure: TONSILLECTOMY;  Surgeon: Geanie Logan, MD;  Location: [facility] ORS;  Service: ENT;  Laterality: N/A;    Prior to Admission medications   Medication Sig Start Date End Date Taking? Authorizing Provider  amoxicillin (AMOXIL) 400 MG/5ML suspension 2 1/4 teaspoons PO BID x 10 days 08/24/17   Geanie Logan, MD  bisacodyl (BISACODYL LAXATIVE) 5 MG EC tablet Take 1 tablet (5 mg  total) by mouth daily as needed for moderate constipation. 03/09/17   Cuthriell, Delorise Royals, PA-C  Buprenorphine HCl-Naloxone HCl (SUBOXONE) 8-2 MG FILM Place 0.5-1 Film under the tongue See admin instructions. Dissolve 1 film under tongue in the morning, dissolve 0.5 film under tongue in the afternoon and dissolve 1 film under tongue at bedtime    [provider]  citalopram (CELEXA) 40 MG tablet Take 40 mg by mouth daily.    [provider]  cloNIDine (CATAPRES - DOSED IN MG/24 HR) 0.1 mg/24hr patch Place 0.1 mg onto the skin every Sunday.    [provider]  docusate sodium (COLACE) 100 MG capsule Take 2 capsules (200 mg total) by mouth 2 (two) times daily. Patient not taking: Reported on 08/12/2017 02/18/16   Sharman Cheek, MD  ergocalciferol (VITAMIN D2) 50000 units capsule Take 50,000 Units by mouth every Sunday.    [provider]  flavoxATE (URISPAS) 100 MG tablet Take 1 tablet (100 mg total) by mouth 3 (three) times daily as needed for bladder spasms. Patient not taking: Reported on 08/12/2017 06/25/16   Merrily Brittle, MD  HYDROcodone-acetaminophen (HYCET) 7.5-325 mg/15 ml solution 10-15cc PO every 4-6 hours as needed for pain 08/24/17   Geanie Logan, MD  hydrocortisone cream 1 % Apply 1 application topically 3 (three) times daily as needed for itching.    [provider]  ibuprofen (ADVIL) 800 MG tablet Take 1 tablet (800 mg total) by mouth every 8 (  eight) hours as needed for moderate pain. 07/29/19   Irean Hong, MD  lactulose (CEPHULAC) 10 g packet Take 1 packet (10 g total) by mouth 3 (three) times daily. Patient not taking: Reported on 08/12/2017 03/09/17   Cuthriell, Delorise Royals, PA-C  lamoTRIgine (LAMICTAL) 200 MG tablet Take 200 mg by mouth 2 (two) times daily.     [provider]  levothyroxine (SYNTHROID, LEVOTHROID) 75 MCG tablet Take 75 mcg by mouth daily before breakfast.    [provider]  lidocaine (XYLOCAINE) 5 %  ointment Apply 1 application topically as needed. Patient taking differently: Apply 1 application topically daily as needed for moderate pain.  03/09/17   Cuthriell, Delorise Royals, PA-C  ondansetron (ZOFRAN ODT) 4 MG disintegrating tablet Take 1 tablet (4 mg total) by mouth every 8 (eight) hours as needed for nausea or vomiting. 07/29/19   Irean Hong, MD  oxyCODONE-acetaminophen (PERCOCET/ROXICET) 5-325 MG tablet Take 1 tablet by mouth every 4 (four) hours as needed for severe pain. 07/29/19   Irean Hong, MD  pantoprazole (PROTONIX) 40 MG tablet Take 40 mg by mouth daily.    [provider]  polyethylene glycol (MIRALAX) packet Take 17 g by mouth daily as needed. Patient not taking: Reported on 08/12/2017 08/24/16   Darci Current, MD  polyethylene glycol powder [facility]) powder 2 cap fulls in a full glass of water, three times a day, for 5 days. Patient not taking: Reported on 08/12/2017 02/18/16   Sharman Cheek, MD  prednisoLONE (ORAPRED) 15 MG/5ML solution 10cc PO BID x 3 days, then 5cc PO BID x 3 days, then 5cc PO QD x 3 days 08/24/17   Geanie Logan, MD  QUEtiapine (SEROQUEL) 100 MG tablet Take one tab in am and two tabs in the PM Patient not taking: Reported on 11/27/2014 11/25/14   Alford Highland, MD  rizatriptan (MAXALT) 10 MG tablet Take 10 mg by mouth as needed for migraine. May repeat in 2 hours if needed    [provider]  senna (SENOKOT) 8.6 MG TABS tablet Take 2 tablets (17.2 mg total) by mouth 2 (two) times daily. Patient not taking: Reported on 08/12/2017 02/18/16   Sharman Cheek, MD  Sodium Bicarbonate (AFTER BITE) 5 % LIQD Apply 1 application topically 3 (three) times daily as needed (for itching).    [provider]    Allergies Sulfa antibiotics  Family History  Problem Relation Age of Onset  . Breast cancer Mother     Social History Social History   Tobacco Use  . Smoking status: Current Some Day Smoker    Packs/day: 1.00     Types: Cigarettes  . Smokeless tobacco: Never Used  Substance Use Topics  . Alcohol use: No    Comment: hx of alcohol abuse - none for 1 year  . 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 ENT: No sore throat. Cardiovascular: Denies chest pain. Respiratory: Denies shortness of breath. Gastrointestinal: No abdominal pain.  Positive for right flank pain, nausea and vomiting.  No diarrhea.  No constipation. Genitourinary: Positive for hematuria.  Negative for dysuria. Musculoskeletal: Negative for back pain. Skin: Negative for rash. Neurological: Negative for headaches, focal weakness or numbness.   ____________________________________________   PHYSICAL EXAM:  VITAL SIGNS: ED Triage Vitals  Enc Vitals Group     BP 07/29/19 0244 138/80  Pulse Rate 07/29/19 0244 60     Resp 07/29/19 0244 18     Temp 07/29/19 0244 97.6 F (36.4 C)     Temp Source 07/29/19 0244 Oral     SpO2 07/29/19 0244 96 %     Weight 07/29/19 0237 300 lb (136.1 kg)     Height 07/29/19 0237 5\' 7"  (1.702 m)     Head Circumference --      Peak Flow --      Pain Score 07/29/19 0237 8     Pain Loc --      Pain Edu? --      Excl. in GC? --     Constitutional: Alert and oriented.  Uncomfortable appearing and in moderate acute distress. Eyes: Conjunctivae are normal. PERRL. EOMI. Head: Atraumatic. Nose: No congestion/rhinnorhea. Mouth/Throat: Mucous membranes are moist.  Oropharynx non-erythematous. Neck: No stridor.   Cardiovascular: Normal rate, regular rhythm. Grossly normal heart sounds.  Good peripheral circulation. Respiratory: Normal respiratory effort.  No retractions. Lungs CTAB. Gastrointestinal: Soft and nontender to light or deep palpation. No distention. No abdominal bruits.  Mild right CVA tenderness.  Actively dry heaving. Musculoskeletal: No lower extremity  tenderness nor edema.  No joint effusions. Neurologic:  Normal speech and language. No gross focal neurologic deficits are appreciated. No gait instability. Skin:  Skin is warm, diaphoretic and intact. No rash noted. Psychiatric: Mood and affect are normal. Speech and behavior are normal.  ____________________________________________   LABS (all labs ordered are listed, but only abnormal results are displayed)  Labs Reviewed  CBC WITH DIFFERENTIAL/PLATELET - Abnormal; Notable for the following components:      Result Value   WBC 16.8 (*)    Neutro Abs 11.4 (*)    Monocytes Absolute 1.3 (*)    All other components within normal limits  BASIC METABOLIC PANEL - Abnormal; Notable for the following components:   Potassium 3.3 (*)    Glucose, Bld 122 (*)    All other components within normal limits  URINALYSIS, COMPLETE (UACMP) WITH MICROSCOPIC - Abnormal; Notable for the following components:   Color, Urine AMBER (*)    APPearance CLOUDY (*)    Specific Gravity, Urine 1.032 (*)    Hgb urine dipstick LARGE (*)    Protein, ur 100 (*)    RBC / HPF >50 (*)    Bacteria, UA RARE (*)    All other components within normal limits  LIPASE, BLOOD   ____________________________________________  EKG  None ____________________________________________  RADIOLOGY  ED MD interpretation: 4 mm distal right ureteral stone with mild right hydronephrosis  Official radiology report(s): CT Renal Stone Study  Result Date: 07/29/2019 CLINICAL DATA:  Hematuria, flank pain EXAM: CT ABDOMEN AND PELVIS WITHOUT CONTRAST TECHNIQUE: Multidetector CT imaging of the abdomen and pelvis was performed following the standard protocol without IV contrast. COMPARISON:  04/16/2018 FINDINGS: Lower chest: Lung bases are clear. Hepatobiliary: Unenhanced liver is unremarkable. Gallbladder is notable for layering sludge versus noncalcified gallstones (series 2/image 32). No associated inflammatory changes. No intrahepatic  or extrahepatic ductal dilatation. Pancreas: Within normal limits. Spleen: Within normal limits. Adrenals/Urinary Tract: Adrenal glands are within normal limits. Kidneys are within normal limits. Mild right hydronephrosis. 4 mm distal right ureteral calculus just above the UVJ (coronal image 93). Bladder is underdistended. Stomach/Bowel: Stomach is within normal limits. No evidence of bowel obstruction. Normal appendix (series 2/image 62). Vascular/Lymphatic: No evidence of abdominal aortic aneurysm. No suspicious abdominopelvic lymphadenopathy. Reproductive: Prostate is unremarkable. Other: No abdominopelvic ascites.  Musculoskeletal: Mild degenerative changes of the lower thoracic spine. IMPRESSION: 4 mm distal right ureteral calculus just above the UVJ. Mild right hydronephrosis. Electronically Signed   By: Julian Hy M.D.   On: 07/29/2019 04:19    ____________________________________________   PROCEDURES  Procedure(s) performed (including Critical Care):  Procedures   ____________________________________________   INITIAL IMPRESSION / ASSESSMENT AND PLAN / ED COURSE  As part of my medical decision making, I reviewed the following data within the [place] notes reviewed and incorporated, Labs reviewed, Old chart reviewed, Radiograph reviewed, Notes from prior ED visits and [place] Controlled Substance Database     [name] was evaluated in Emergency Department on 07/29/2019 for the symptoms described in the history of present illness. He was evaluated in the context of the global COVID-19 pandemic, which necessitated consideration that the patient might be at risk for infection with the SARS-CoV-2 virus that causes COVID-19. Institutional protocols and algorithms that pertain to the evaluation of patients at risk for COVID-19 are in a state of rapid change based on information released by regulatory bodies including the CDC and federal and state organizations.  These policies and algorithms were followed during the patient's care in the ED.    29 year old male presenting with right flank pain. Differential diagnosis includes, but is not limited to, acute appendicitis, renal colic, testicular torsion, urinary tract infection/pyelonephritis, prostatitis,  epididymitis, diverticulitis, small bowel obstruction or ileus, colitis, abdominal aortic aneurysm, gastroenteritis, hernia, etc.  Laboratory results reveal moderate leukocytosis, normal renal function, UA negative for infection.  CT demonstrates 4 mm right ureteral stone.  Will initiate IV fluid resuscitation, IV Dilaudid for pain paired with IV Toradol, IV Zofran for nausea.  Patient has anaphylaxis to sulfa drugs so will hold Flomax as this has cross-reactivity.   Clinical Course as of Jul 29 631  Sun Jul 29, 2019  2774 Pain better.  Will administer oral analgesia at this time.   [JS]  0620 Pain significantly better.  Eager for discharge home.  Will discharge home on Percocet and Zofran.  Hold Flomax as patient has anaphylaxis to sulfa drugs.  Strict return precautions given.  Patient verbalizes understanding and agrees with plan of care.   [JS]    Clinical Course User Index [JS] Paulette Blanch, MD     ____________________________________________   FINAL CLINICAL IMPRESSION(S) / ED DIAGNOSES  Final diagnoses:  Ureteral colic  Right flank pain     ED Discharge Orders         Ordered    ibuprofen (ADVIL) 800 MG tablet  Every 8 hours PRN     07/29/19 0429    oxyCODONE-acetaminophen (PERCOCET/ROXICET) 5-325 MG tablet  Every 4 hours PRN     07/29/19 0429    ondansetron (ZOFRAN ODT) 4 MG disintegrating tablet  Every 8 hours PRN     07/29/19 0429           Note:  This document was prepared using Dragon voice recognition software and may include unintentional dictation errors.   Paulette Blanch, MD 07/29/19 [phone]

## 2019-07-29 NOTE — ED Triage Notes (Signed)
Patient presents to ED for kidney stones. States he has a history of same and this pain feels the same as before. Hematuria. Pain x 4 days, tonight is much worse.

## 2019-07-29 NOTE — ED Notes (Signed)
Pt states history of kidney stones. Pt endorses pain 9/10. Pt states pain to right lower back and to the groin.

## 2020-09-06 ENCOUNTER — Emergency Department
Admission: EM | Admit: 2020-09-06 | Discharge: 2020-09-08 | Disposition: A | Discharge status: Home / Self Care | Payer: Federal, State, Local not specified - PPO | Attending: Emergency Medicine | Admitting: Emergency Medicine

## 2020-09-06 ENCOUNTER — Other Ambulatory Visit: Payer: Self-pay

## 2020-09-06 DIAGNOSIS — H9221 Otorrhagia, right ear: Secondary | ICD-10-CM | POA: Insufficient documentation

## 2020-09-06 DIAGNOSIS — Z5321 Procedure and treatment not carried out due to patient leaving prior to being seen by health care provider: Secondary | ICD-10-CM | POA: Diagnosis not present

## 2020-09-06 DIAGNOSIS — R519 Headache, unspecified: Secondary | ICD-10-CM | POA: Diagnosis not present

## 2020-09-06 NOTE — ED Triage Notes (Addendum)
Pt states he has blood coming out of his right ear that started aprox 20 min ago. Pt denies pain at this time. Pt also denies any trauma to the ear, pt states he developed a headache after the bleeding started. Pt a&o x4, no neuro deficits at this time. Pt denies hitting head.

## 2021-01-25 ENCOUNTER — Other Ambulatory Visit: Payer: Self-pay

## 2021-01-25 ENCOUNTER — Emergency Department
Admission: EM | Admit: 2021-01-25 | Discharge: 2021-01-26 | Disposition: A | Discharge status: Home / Self Care | Payer: Federal, State, Local not specified - PPO | Attending: Emergency Medicine | Admitting: Emergency Medicine

## 2021-01-25 ENCOUNTER — Emergency Department: Payer: Federal, State, Local not specified - PPO

## 2021-01-25 DIAGNOSIS — F17210 Nicotine dependence, cigarettes, uncomplicated: Secondary | ICD-10-CM | POA: Insufficient documentation

## 2021-01-25 DIAGNOSIS — Z20822 Contact with and (suspected) exposure to covid-19: Secondary | ICD-10-CM | POA: Diagnosis not present

## 2021-01-25 DIAGNOSIS — F840 Autistic disorder: Secondary | ICD-10-CM | POA: Insufficient documentation

## 2021-01-25 DIAGNOSIS — R1084 Generalized abdominal pain: Secondary | ICD-10-CM | POA: Insufficient documentation

## 2021-01-25 DIAGNOSIS — E039 Hypothyroidism, unspecified: Secondary | ICD-10-CM | POA: Insufficient documentation

## 2021-01-25 DIAGNOSIS — Z79899 Other long term (current) drug therapy: Secondary | ICD-10-CM | POA: Diagnosis not present

## 2021-01-25 DIAGNOSIS — R109 Unspecified abdominal pain: Secondary | ICD-10-CM | POA: Diagnosis present

## 2021-01-25 DIAGNOSIS — J101 Influenza due to other identified influenza virus with other respiratory manifestations: Secondary | ICD-10-CM | POA: Insufficient documentation

## 2021-01-25 DIAGNOSIS — M545 Low back pain, unspecified: Secondary | ICD-10-CM

## 2021-01-25 LAB — TROPONIN I (HIGH SENSITIVITY)
Troponin I (High Sensitivity): 3 ng/L (ref <18)
Troponin I (High Sensitivity): 3 ng/L (ref <18)

## 2021-01-25 LAB — CBC
HCT: 45.8 % (ref 39.0–52.0)
Hemoglobin: 15.4 g/dL (ref 13.0–17.0)
MCH: 29.8 pg (ref 26.0–34.0)
MCHC: 33.6 g/dL (ref 30.0–36.0)
MCV: 88.8 fL (ref 80.0–100.0)
Platelets: 182 10*3/uL (ref 150–400)
RBC: 5.16 MIL/uL (ref 4.22–5.81)
RDW: 13 % (ref 11.5–15.5)
WBC: 10.3 10*3/uL (ref 4.0–10.5)
nRBC: 0 % (ref 0.0–0.2)

## 2021-01-25 LAB — URINALYSIS, ROUTINE W REFLEX MICROSCOPIC
Bilirubin Urine: NEGATIVE
Glucose, UA: NEGATIVE mg/dL
Hgb urine dipstick: NEGATIVE
Ketones, ur: NEGATIVE mg/dL
Leukocytes,Ua: NEGATIVE
Nitrite: NEGATIVE
Protein, ur: NEGATIVE mg/dL
Specific Gravity, Urine: 1.015 (ref 1.005–1.030)
pH: 6 (ref 5.0–8.0)

## 2021-01-25 LAB — BASIC METABOLIC PANEL
Anion gap: 8 (ref 5–15)
BUN: 12 mg/dL (ref 6–20)
CO2: 25 mmol/L (ref 22–32)
Calcium: 9.3 mg/dL (ref 8.9–10.3)
Chloride: 106 mmol/L (ref 98–111)
Creatinine, Ser: 0.9 mg/dL (ref 0.61–1.24)
GFR, Estimated: >60 mL/min (ref >60)
Glucose, Bld: 133 mg/dL — ABNORMAL HIGH (ref 70–99)
Potassium: 3.6 mmol/L (ref 3.5–5.1)
Sodium: 139 mmol/L (ref 135–145)

## 2021-01-25 MED ORDER — ACETAMINOPHEN 500 MG PO TABS
1000.0000 mg | ORAL_TABLET | Freq: Once | ORAL | Status: AC
  Administered 2021-01-25: 1000 mg via ORAL
  Filled 2021-01-25: qty 2

## 2021-01-25 MED ORDER — KETOROLAC TROMETHAMINE 30 MG/ML IJ SOLN
15.0000 mg | INTRAMUSCULAR | Status: AC
  Administered 2021-01-25: 15 mg via INTRAMUSCULAR
  Filled 2021-01-25: qty 1

## 2021-01-25 MED ORDER — ONDANSETRON 4 MG PO TBDP
4.0000 mg | ORAL_TABLET | Freq: Three times a day (TID) | ORAL | 0 refills | Status: DC | PRN
Start: 2021-01-25 — End: 2021-11-14

## 2021-01-25 MED ORDER — NAPROXEN 500 MG PO TABS
500.0000 mg | ORAL_TABLET | Freq: Two times a day (BID) | ORAL | 0 refills | Status: DC
Start: 2021-01-25 — End: 2023-04-18

## 2021-01-25 MED ORDER — ONDANSETRON 4 MG PO TBDP
ORAL_TABLET | ORAL | Status: AC
  Filled 2021-01-25: qty 1

## 2021-01-25 MED ORDER — NAPROXEN 250 MG PO TABS: 250.0000 mg | ORAL_TABLET | Freq: Two times a day (BID) | ORAL | 0 refills | Status: AC

## 2021-01-25 MED ORDER — IBUPROFEN 400 MG PO TABS
400.0000 mg | ORAL_TABLET | Freq: Once | ORAL | Status: AC
  Administered 2021-01-25: 400 mg via ORAL
  Filled 2021-01-25: qty 1

## 2021-01-25 MED ORDER — ONDANSETRON 4 MG PO TBDP
4.0000 mg | ORAL_TABLET | Freq: Once | ORAL | Status: AC
  Administered 2021-01-25: 4 mg via ORAL
  Filled 2021-01-25: qty 1

## 2021-01-25 NOTE — ED Provider Notes (Signed)
Patient signed out to me by Dr. Scotty Court.  Pending a CT renal study and a urine.  Patient presenting with bilateral flank pain and some abdominal pain.  His urine sample was within normal limits.  CT renal study is negative does not show any explanation of his pain.  Will discharge with course of naproxen.  Discussed primary care follow-up.   Georga Hacking, MD 01/25/21 [phone]

## 2021-01-25 NOTE — ED Provider Notes (Signed)
[facility] Emergency Department Provider Note  ____________________________________________  Time seen: Approximately 10:00 PM  I have reviewed the triage vital signs and the nursing notes.   HISTORY  Chief Complaint Flank Pain    HPI KASTYN STELTENPOHL is a 30 y.o. male with a past history of GERD, kidney stones, bipolar disorder who comes ED complaining of bilateral flank pain radiating to the lower abdomen that is been going on for the past 3 days, constant, waxing and waning, no aggravating or alleviating factors.  Denies dysuria frequency urgency, denies fever but endorses chills.  Triage note also reports patient had complained of chest pain and body aches.  He denies that currently.    Past Medical History:  Diagnosis Date   Bipolar 1 disorder ([place])    Depression    GERD (gastroesophageal reflux disease)    Headache    migraines   History of kidney stones    Hypothyroidism    PTSD (post-traumatic stress disorder)    Suicide attempt [facility])    several attempts   Thyroid disease      Patient Active Problem List   Diagnosis Date Noted   Vomiting 11/27/2014   Dyspnea 11/26/2014   Leucocytosis 11/26/2014   Autism 11/16/2014   Bipolar 1 disorder ([place]) 11/16/2014   ARDS (adult respiratory distress syndrome) ([place]) 11/16/2014   Severe sepsis with acute organ dysfunction ([place]) 11/16/2014   Aspiration pneumonia ([place]) 11/15/2014   Overdose    Acute respiratory failure with hypoxia ([place]) 11/13/2014     Past Surgical History:  Procedure Laterality Date   TONSILLECTOMY N/A 08/24/2017   Procedure: TONSILLECTOMY;  Surgeon: Clyde Canterbury, MD;  Location: [facility] ORS;  Service: ENT;  Laterality: N/A;     Prior to Admission medications   Medication Sig Start Date End Date Taking? Authorizing Provider  naproxen (NAPROSYN) 500 MG tablet Take 1 tablet (500 mg total) by mouth 2 (two) times daily with a meal. 01/25/21  Yes Carrie Mew, MD  ondansetron  (ZOFRAN ODT) 4 MG disintegrating tablet Take 1 tablet (4 mg total) by mouth every 8 (eight) hours as needed for nausea or vomiting. 01/25/21  Yes Carrie Mew, MD  amoxicillin (AMOXIL) 400 MG/5ML suspension 2 1/4 teaspoons PO BID x 10 days 08/24/17   Clyde Canterbury, MD  bisacodyl (BISACODYL LAXATIVE) 5 MG EC tablet Take 1 tablet (5 mg total) by mouth daily as needed for moderate constipation. 03/09/17   Cuthriell, Charline Bills, PA-C  Buprenorphine HCl-Naloxone HCl (SUBOXONE) 8-2 MG FILM Place 0.5-1 Film under the tongue See admin instructions. Dissolve 1 film under tongue in the morning, dissolve 0.5 film under tongue in the afternoon and dissolve 1 film under tongue at bedtime    [provider]  citalopram (CELEXA) 40 MG tablet Take 40 mg by mouth daily.    [provider]  cloNIDine (CATAPRES - DOSED IN MG/24 HR) 0.1 mg/24hr patch Place 0.1 mg onto the skin every Sunday.    [provider]  docusate sodium (COLACE) 100 MG capsule Take 2 capsules (200 mg total) by mouth 2 (two) times daily. Patient not taking: Reported on 08/12/2017 02/18/16   Carrie Mew, MD  ergocalciferol (VITAMIN D2) 50000 units capsule Take 50,000 Units by mouth every Sunday.    [provider]  flavoxATE (URISPAS) 100 MG tablet Take 1 tablet (100 mg total) by mouth 3 (three) times daily as needed for bladder spasms. Patient not taking: Reported on 08/12/2017 06/25/16   Darel Hong, MD  HYDROcodone-acetaminophen (HYCET) 7.5-325 mg/15 ml solution 10-15cc PO every 4-6 hours as needed for pain 08/24/17   Clyde Canterbury, MD  hydrocortisone cream 1 % Apply 1 application topically 3 (three) times daily as needed for itching.    [provider]  ibuprofen (ADVIL) 800 MG tablet Take 1 tablet (800 mg total) by mouth every 8 (eight) hours as needed for moderate pain. 07/29/19   Paulette Blanch, MD  lactulose (CEPHULAC) 10 g packet Take 1 packet (10 g total) by mouth 3 (three) times  daily. Patient not taking: Reported on 08/12/2017 03/09/17   Cuthriell, Charline Bills, PA-C  lamoTRIgine (LAMICTAL) 200 MG tablet Take 200 mg by mouth 2 (two) times daily.     [provider]  levothyroxine (SYNTHROID, LEVOTHROID) 75 MCG tablet Take 75 mcg by mouth daily before breakfast.    [provider]  lidocaine (XYLOCAINE) 5 % ointment Apply 1 application topically as needed. Patient taking differently: Apply 1 application topically daily as needed for moderate pain.  03/09/17   Cuthriell, Charline Bills, PA-C  oxyCODONE-acetaminophen (PERCOCET/ROXICET) 5-325 MG tablet Take 1 tablet by mouth every 4 (four) hours as needed for severe pain. 07/29/19   Paulette Blanch, MD  pantoprazole (PROTONIX) 40 MG tablet Take 40 mg by mouth daily.    [provider]  polyethylene glycol (MIRALAX) packet Take 17 g by mouth daily as needed. Patient not taking: Reported on 08/12/2017 08/24/16   Gregor Hams, MD  polyethylene glycol powder [facility]) powder 2 cap fulls in a full glass of water, three times a day, for 5 days. Patient not taking: Reported on 08/12/2017 02/18/16   Carrie Mew, MD  prednisoLONE (ORAPRED) 15 MG/5ML solution 10cc PO BID x 3 days, then 5cc PO BID x 3 days, then 5cc PO QD x 3 days 08/24/17   Clyde Canterbury, MD  QUEtiapine (SEROQUEL) 100 MG tablet Take one tab in am and two tabs in the PM Patient not taking: Reported on 11/27/2014 11/25/14   Loletha Grayer, MD  rizatriptan (MAXALT) 10 MG tablet Take 10 mg by mouth as needed for migraine. May repeat in 2 hours if needed    [provider]  senna (SENOKOT) 8.6 MG TABS tablet Take 2 tablets (17.2 mg total) by mouth 2 (two) times daily. Patient not taking: Reported on 08/12/2017 02/18/16   Carrie Mew, MD  Sodium Bicarbonate (AFTER BITE) 5 % LIQD Apply 1 application topically 3 (three) times daily as needed (for itching).    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  ENT:   No sore throat. No rhinorrhea. Cardiovascular:   No chest pain or syncope. Respiratory:   No dyspnea or cough. Gastrointestinal:   Positive as above for abdominal pain.  Positive vomiting.  No constipation or diarrhea.  Musculoskeletal:   Negative for  focal pain or swelling All other systems reviewed and are negative except as documented above in ROS and HPI.  ____________________________________________   PHYSICAL EXAM:  VITAL SIGNS: ED Triage Vitals  Enc Vitals Group     BP 01/25/21 1735 114/82     Pulse Rate 01/25/21 1735 (!) 132     Resp 01/25/21 1735 18     Temp 01/25/21 1735 99.9 F (37.7 C)     Temp Source 01/25/21 1735 Oral     SpO2 01/25/21 1735 97 %     Weight 01/25/21 1735 240 lb (108.9 kg)     Height 01/25/21 1735 5\' 7"  (1.702 m)     Head Circumference --      Peak Flow --      Pain Score 01/25/21 1742 9     Pain Loc --      Pain Edu? --      Excl. in [place]? --     Vital signs reviewed, nursing assessments reviewed.   Constitutional:   Alert and oriented. Non-toxic appearance. Eyes:   Conjunctivae are normal. EOMI. PERRL. ENT      Head:   Normocephalic and atraumatic.      Nose:   Normal.      Mouth/Throat:   Moist mucosa      Neck:   No meningismus. Full ROM. Hematological/Lymphatic/Immunilogical:   No cervical lymphadenopathy. Cardiovascular:   RRR, heart rate 90. Symmetric bilateral radial and DP pulses.  No murmurs. Cap refill less than 2 seconds. Respiratory:    Normal respiratory effort without tachypnea/retractions. Breath sounds are clear and equal bilaterally. No wheezes/rales/rhonchi. Gastrointestinal:   Soft with diffuse left-sided abdominal tenderness . Non distended. There is no CVA tenderness.  No rebound, rigidity, or guarding. Genitourinary:   deferred Musculoskeletal:   Normal range of motion in all extremities. No joint effusions.  No lower extremity tenderness.  No edema. Neurologic:   Normal speech and language.  Motor grossly intact. No acute focal neurologic deficits are appreciated.  Skin:    Skin is warm, dry and intact. No rash noted.  No petechiae, purpura, or bullae.  ____________________________________________    LABS (pertinent positives/negatives) (all labs ordered are listed, but only abnormal results are displayed) Labs Reviewed  BASIC METABOLIC PANEL - Abnormal; Notable for the following components:      Result Value   Glucose, Bld 133 (*)    All other components within normal limits  URINALYSIS, ROUTINE W REFLEX MICROSCOPIC - Abnormal; Notable for the following components:   Color, Urine YELLOW (*)    APPearance CLEAR (*)    All other components within normal limits  RESP PANEL BY RT-PCR (FLU A&B, COVID) ARPGX2  CBC  TROPONIN I (HIGH SENSITIVITY)  TROPONIN I (HIGH SENSITIVITY)   ____________________________________________   EKG Interpreted by me Sinus tachycardia rate 114.  Normal axis and intervals.  Normal QRS ST segments and T waves.   ____________________________________________    [id number]  DG Chest 2 View  Result Date: 01/25/2021 CLINICAL DATA:  Chest pain EXAM: CHEST - 2 VIEW COMPARISON:  February 18, 2016 FINDINGS: The heart size and mediastinal contours are within normal limits. Both lungs are clear. The visualized skeletal structures are unremarkable. IMPRESSION: No active cardiopulmonary disease. Electronically Signed   By: Dorise Bullion III M.D.   On: 01/25/2021 18:41   CT Renal  Stone Study  Result Date: 01/25/2021 CLINICAL DATA:  Flank pain EXAM: CT ABDOMEN AND PELVIS WITHOUT CONTRAST TECHNIQUE: Multidetector CT imaging of the  abdomen and pelvis was performed following the standard protocol without IV contrast. COMPARISON:  07/29/2019 FINDINGS: Lower chest: No acute abnormality. Hepatobiliary: No focal liver abnormality is seen. No gallstones, gallbladder wall thickening, or biliary dilatation. Pancreas: Unremarkable. No pancreatic ductal dilatation or surrounding inflammatory changes. Spleen: Normal in size without focal abnormality. Adrenals/Urinary Tract: Adrenal glands are within normal limits. Kidneys demonstrate no renal calculi or obstructive changes. Ureters are within normal limits. The bladder is partially distended. Stomach/Bowel: The appendix is within normal limits. No obstructive or inflammatory changes of the colon are seen. Small bowel and stomach are within normal limits. Vascular/Lymphatic: No significant vascular findings are present. No enlarged abdominal or pelvic lymph nodes. Reproductive: Prostate is unremarkable. Other: No abdominal wall hernia or abnormality. No abdominopelvic ascites. Musculoskeletal: No acute or significant osseous findings. IMPRESSION: No acute abnormality noted to correspond with the given clinical history Electronically Signed   By: Alcide Clever M.D.   On: 01/25/2021 23:07    ____________________________________________   PROCEDURES Procedures  ____________________________________________  DIFFERENTIAL DIAGNOSIS   Ureterolithiasis, colitis, diverticulitis, pancreatitis, viral illness  CLINICAL IMPRESSION / ASSESSMENT AND PLAN / ED COURSE  Medications ordered in the ED: Medications  ondansetron (ZOFRAN-ODT) 4 MG disintegrating tablet (  Given 01/25/21 1816)  ondansetron (ZOFRAN-ODT) disintegrating tablet 4 mg (4 mg Oral Given 01/25/21 2221)  ketorolac (TORADOL) 30 MG/ML injection 15 mg (15 mg Intramuscular Given 01/25/21  2304)    Pertinent labs & imaging results that were available during my care of the patient were reviewed by me and considered in my medical decision making (see chart for details).  DARICK FETTERS was evaluated in Emergency Department on 01/25/2021 for the symptoms described in the history of present illness. He was evaluated in the context of the global COVID-19 pandemic, which necessitated consideration that the patient might be at risk for infection with the SARS-CoV-2 virus that causes COVID-19. Institutional protocols and algorithms that pertain to the evaluation of patients at risk for COVID-19 are in a state of rapid change based on information released by regulatory bodies including the CDC and federal and state organizations. These policies and algorithms were followed during the patient's care in the ED.   Patient presents with generalized abdominal pain, found to have left-sided abdominal tenderness.  Initially was found to be tachycardic which has resolved.  Will give IM Toradol for pain relief, Zofran for nausea, obtain CT abdomen pelvis.  Suspect viral illness, possibly influenza.  Will check swab, plan for supportive care if CT is negative.  Clinical Course as of 01/25/21 2339  Wynelle Link Jan 25, 2021  2337 IMPRESSION: No acute abnormality noted to correspond with the given clinical history     [KM]    Clinical Course User Index [KM] Georga Hacking, MD     ____________________________________________   FINAL CLINICAL IMPRESSION(S) / ED DIAGNOSES    Final diagnoses:  Generalized abdominal pain     ED Discharge Orders          Ordered    naproxen (NAPROSYN) 500 MG tablet  2 times daily with meals        01/25/21 2339    ondansetron (ZOFRAN ODT) 4 MG disintegrating tablet  Every 8 hours PRN        01/25/21 2339            Portions of this note were generated with dragon dictation software. Dictation errors may occur despite best attempts at proofreading.     Sharman Cheek, MD 01/25/21  2344  

## 2021-01-25 NOTE — ED Triage Notes (Signed)
Pt come with c/o CP, N/V and belly pain. Pt states pain all over. Pt states lower back and leg pain. Pt vomiting in triage. Pt tachy at 132

## 2021-01-26 LAB — RESP PANEL BY RT-PCR (FLU A&B, COVID) ARPGX2
Influenza A by PCR: POSITIVE — AB
Influenza B by PCR: NEGATIVE
SARS Coronavirus 2 by RT PCR: NEGATIVE

## 2021-11-14 ENCOUNTER — Emergency Department
Admission: EM | Admit: 2021-11-14 | Discharge: 2021-11-14 | Disposition: A | Discharge status: Home / Self Care | Payer: Federal, State, Local not specified - PPO | Attending: Emergency Medicine | Admitting: Emergency Medicine

## 2021-11-14 ENCOUNTER — Emergency Department: Payer: Federal, State, Local not specified - PPO

## 2021-11-14 ENCOUNTER — Other Ambulatory Visit: Payer: Self-pay

## 2021-11-14 DIAGNOSIS — Y92410 Unspecified street and highway as the place of occurrence of the external cause: Secondary | ICD-10-CM | POA: Insufficient documentation

## 2021-11-14 DIAGNOSIS — S299XXA Unspecified injury of thorax, initial encounter: Secondary | ICD-10-CM | POA: Diagnosis present

## 2021-11-14 MED ORDER — KETOROLAC TROMETHAMINE 15 MG/ML IJ SOLN
15.0000 mg | Freq: Once | INTRAMUSCULAR | Status: AC
  Administered 2021-11-14: 15 mg via INTRAMUSCULAR
  Filled 2021-11-14: qty 1

## 2021-11-14 MED ORDER — LIDOCAINE 5 % EX PTCH
1.0000 | MEDICATED_PATCH | CUTANEOUS | Status: DC
  Administered 2021-11-14: 1 via TRANSDERMAL
  Filled 2021-11-14: qty 1

## 2021-11-14 MED ORDER — LIDOCAINE 5 % EX PTCH: 1.0000 | MEDICATED_PATCH | Freq: Two times a day (BID) | CUTANEOUS | 0 refills | Status: AC

## 2021-11-14 MED ORDER — ACETAMINOPHEN 325 MG PO TABS
650.0000 mg | ORAL_TABLET | Freq: Once | ORAL | Status: AC
  Administered 2021-11-14: 650 mg via ORAL
  Filled 2021-11-14: qty 2
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
## 2021-11-14 NOTE — Discharge Instructions (Addendum)
You may continue to take Tylenol/ibuprofen per package instructions to help with your symptoms.  Your chest x-ray did not show any broken ribs or deflated lung.  Please return the emergency department immediately for any new, worsening, or change in symptoms or other concerns.  It was a pleasure caring for you today.
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
## 2022-08-30 ENCOUNTER — Other Ambulatory Visit: Payer: Self-pay | Admitting: Family

## 2022-09-06 ENCOUNTER — Other Ambulatory Visit: Payer: Self-pay | Admitting: Family

## 2022-10-05 ENCOUNTER — Other Ambulatory Visit: Payer: Self-pay | Admitting: Family

## 2023-02-02 ENCOUNTER — Other Ambulatory Visit: Payer: Self-pay | Admitting: Family

## 2023-02-16 ENCOUNTER — Other Ambulatory Visit: Payer: Self-pay | Admitting: Family

## 2023-03-03 ENCOUNTER — Other Ambulatory Visit: Payer: Self-pay | Admitting: Family

## 2023-03-08 ENCOUNTER — Other Ambulatory Visit: Payer: Self-pay | Admitting: Family

## 2023-04-04 ENCOUNTER — Other Ambulatory Visit: Payer: Self-pay | Admitting: Family

## 2023-04-18 ENCOUNTER — Encounter: Payer: Self-pay | Admitting: Family

## 2023-04-18 ENCOUNTER — Ambulatory Visit: Payer: Medicaid Other | Admitting: Family

## 2023-04-18 DIAGNOSIS — E538 Deficiency of other specified B group vitamins: Secondary | ICD-10-CM

## 2023-04-18 DIAGNOSIS — R7303 Prediabetes: Secondary | ICD-10-CM

## 2023-04-18 DIAGNOSIS — F3175 Bipolar disorder, in partial remission, most recent episode depressed: Secondary | ICD-10-CM

## 2023-04-18 DIAGNOSIS — I10 Essential (primary) hypertension: Secondary | ICD-10-CM

## 2023-04-18 DIAGNOSIS — Z114 Encounter for screening for human immunodeficiency virus [HIV]: Secondary | ICD-10-CM

## 2023-04-18 DIAGNOSIS — F319 Bipolar disorder, unspecified: Secondary | ICD-10-CM

## 2023-04-18 DIAGNOSIS — E039 Hypothyroidism, unspecified: Secondary | ICD-10-CM

## 2023-04-18 DIAGNOSIS — E782 Mixed hyperlipidemia: Secondary | ICD-10-CM

## 2023-04-18 DIAGNOSIS — E559 Vitamin D deficiency, unspecified: Secondary | ICD-10-CM

## 2023-04-18 DIAGNOSIS — Z1159 Encounter for screening for other viral diseases: Secondary | ICD-10-CM

## 2023-04-18 MED ORDER — TEMAZEPAM 15 MG PO CAPS: 15.0000 mg | ORAL_CAPSULE | Freq: Every day | ORAL | 1 refills | Status: DC

## 2023-04-21 LAB — CBC WITH DIFFERENTIAL/PLATELET
Basophils Absolute: 0.1 10*3/uL (ref 0.0–0.2)
Basos: 1 %
EOS (ABSOLUTE): 0.3 10*3/uL (ref 0.0–0.4)
Eos: 3 %
Hematocrit: 48.7 % (ref 37.5–51.0)
Hemoglobin: 16.3 g/dL (ref 13.0–17.7)
Immature Grans (Abs): 0 10*3/uL (ref 0.0–0.1)
Immature Granulocytes: 0 %
Lymphocytes Absolute: 3.4 10*3/uL — ABNORMAL HIGH (ref 0.7–3.1)
Lymphs: 38 %
MCH: 29.9 pg (ref 26.6–33.0)
MCHC: 33.5 g/dL (ref 31.5–35.7)
MCV: 89 fL (ref 79–97)
Monocytes Absolute: 0.6 10*3/uL (ref 0.1–0.9)
Monocytes: 7 %
Neutrophils Absolute: 4.5 10*3/uL (ref 1.4–7.0)
Neutrophils: 51 %
Platelets: 265 10*3/uL (ref 150–450)
RBC: 5.45 x10E6/uL (ref 4.14–5.80)
RDW: 12.7 % (ref 11.6–15.4)
WBC: 9 10*3/uL (ref 3.4–10.8)

## 2023-04-21 LAB — CMP14+EGFR
ALT: 22 [IU]/L (ref 0–44)
AST: 23 [IU]/L (ref 0–40)
Albumin: 5.3 g/dL — ABNORMAL HIGH (ref 4.1–5.1)
Alkaline Phosphatase: 84 [IU]/L (ref 44–121)
BUN/Creatinine Ratio: 9 (ref 9–20)
BUN: 9 mg/dL (ref 6–20)
Bilirubin Total: <0.2 mg/dL (ref 0.0–1.2)
CO2: 16 mmol/L — ABNORMAL LOW (ref 20–29)
Calcium: 10.6 mg/dL — ABNORMAL HIGH (ref 8.7–10.2)
Chloride: 118 mmol/L (ref 96–106)
Creatinine, Ser: 0.99 mg/dL (ref 0.76–1.27)
Globulin, Total: 2.9 g/dL (ref 1.5–4.5)
Total Protein: 8.2 g/dL (ref 6.0–8.5)
eGFR: 104 mL/min/{1.73_m2} (ref >59)

## 2023-04-21 LAB — LIPID PANEL
Chol/HDL Ratio: 3.3 {ratio} (ref 0.0–5.0)
Cholesterol, Total: 285 mg/dL — ABNORMAL HIGH (ref 100–199)
HDL: 86 mg/dL (ref >39)

## 2023-04-21 LAB — VITAMIN B12: Vitamin B-12: 479 pg/mL (ref 232–1245)

## 2023-04-21 LAB — HEMOGLOBIN A1C
Est. average glucose Bld gHb Est-mCnc: 114 mg/dL
Hgb A1c MFr Bld: 5.6 % (ref 4.8–5.6)

## 2023-04-21 LAB — VITAMIN D 25 HYDROXY (VIT D DEFICIENCY, FRACTURES): Vit D, 25-Hydroxy: 41.1 ng/mL (ref 30.0–100.0)

## 2023-04-21 LAB — TSH: TSH: 1.12 u[IU]/mL (ref 0.450–4.500)

## 2023-04-21 LAB — HCV AB W REFLEX TO QUANT PCR: HCV Ab: NONREACTIVE

## 2023-04-21 LAB — HCV INTERPRETATION

## 2023-04-23 ENCOUNTER — Other Ambulatory Visit: Payer: Self-pay | Admitting: Family

## 2023-05-02 NOTE — Progress Notes (Signed)
 Patient notified

## 2023-06-03 ENCOUNTER — Other Ambulatory Visit: Payer: Self-pay | Admitting: Family

## 2023-06-11 ENCOUNTER — Encounter: Payer: Self-pay | Admitting: Family

## 2023-06-11 DIAGNOSIS — E559 Vitamin D deficiency, unspecified: Secondary | ICD-10-CM | POA: Insufficient documentation

## 2023-06-11 DIAGNOSIS — E039 Hypothyroidism, unspecified: Secondary | ICD-10-CM | POA: Insufficient documentation

## 2023-06-11 DIAGNOSIS — E782 Mixed hyperlipidemia: Secondary | ICD-10-CM | POA: Insufficient documentation

## 2023-06-11 NOTE — Assessment & Plan Note (Signed)
 Checking labs today.  Will continue supplements as needed.

## 2023-06-11 NOTE — Progress Notes (Signed)
 Established Patient Office Visit  Subjective:  Patient ID: [name], male    DOB: 1990/06/13  Age: 33 y.o. MRN: [id number]  Chief Complaint  Patient presents with   Follow-up    Medication refills    Patient is here today for his  follow up.  He has been feeling fairly well since last appointment.   He does not have additional concerns to discuss today.  Labs are due today. He needs refills.   I have reviewed his active problem list, medication list, allergies, notes from last encounter, lab results for his appointment today.      No other concerns at this time.   Past Medical History:  Diagnosis Date   Acute respiratory failure with hypoxia (HCC) 11/13/2014   ARDS (adult respiratory distress syndrome) (HCC) 11/16/2014   Aspiration pneumonia (HCC) 11/15/2014   Bipolar 1 disorder (HCC)    Depression    Dyspnea 11/26/2014   GERD (gastroesophageal reflux disease)    Headache    migraines   History of kidney stones    Hypothyroidism    Leucocytosis 11/26/2014   Overdose    PTSD (post-traumatic stress disorder)    Severe sepsis with acute organ dysfunction (HCC) 11/16/2014   Suicide attempt (HCC)    several attempts   Thyroid disease    Vomiting 11/27/2014    Past Surgical History:  Procedure Laterality Date   TONSILLECTOMY N/A 08/24/2017   Procedure: TONSILLECTOMY;  Surgeon: Geanie Logan, MD;  Location: [facility] ORS;  Service: ENT;  Laterality: N/A;    Social History   Socioeconomic History   Marital status: Single    Spouse name: Not on file   Number of children: Not on file   Years of education: Not on file   Highest education level: Not on file  Occupational History   Not on file  Tobacco Use   Smoking status: Some Days    Current packs/day: 1.00    Types: Cigarettes   Smokeless tobacco: Never  Vaping Use   Vaping status: Never Used  Substance and Sexual Activity   Alcohol use: No    Comment: hx of alcohol abuse - none for 1 year   Drug  use: Not Currently    Comment: hx of heroin, mj, demerol, xanax ,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      Objective:   BP 100/66   Pulse 85   Ht 5\' 7"  (1.702 m)   Wt 246 lb 3.2 oz (111.7 kg)   SpO2 96%   BMI 38.56 kg/m   Vitals:   04/18/23 1503  BP: 100/66  Pulse: 85  Height: 5\' 7"  (1.702 m)  Weight: 246 lb 3.2 oz (111.7 kg)  SpO2: 96%  BMI (Calculated): 38.55    Physical Exam Vitals and nursing note reviewed.  Constitutional:  Appearance: Normal appearance. He is obese.  HENT:     Head: Normocephalic and atraumatic.  Eyes:     Pupils: Pupils are equal, round, and reactive to light.  Cardiovascular:     Rate and Rhythm: Normal rate and regular rhythm.     Pulses: Normal pulses.     Heart sounds: Normal heart sounds.  Pulmonary:     Effort: Pulmonary effort is normal.     Breath sounds: Normal breath sounds.  Neurological:     Mental Status: He is alert.  Psychiatric:        Mood and Affect: Mood normal.        Behavior: Behavior normal.      Results for orders placed or performed in visit on 04/18/23  Lipid panel  Result Value Ref Range   Cholesterol, Total 285 (H) 100 - 199 mg/dL   Triglycerides CANCELED mg/dL   HDL 86 >16 mg/dL   VLDL Cholesterol Cal CANCELED mg/dL   LDL Chol Calc (NIH) CANCELED mg/dL   LDL CALC COMMENT: CANCELED    Chol/HDL Ratio 3.3 0.0 - 5.0 ratio  VITAMIN D  25 Hydroxy (Vit-D Deficiency, Fractures)  Result Value Ref Range   Vit D, 25-Hydroxy 41.1 30.0 - 100.0 ng/mL  CMP14+EGFR  Result Value Ref Range   Glucose CANCELED mg/dL   BUN 9 6 - 20 mg/dL   Creatinine, Ser 1.09 0.76 - 1.27 mg/dL   eGFR 604 >54 UJ/WJX/9.14   BUN/Creatinine Ratio 9 9 - 20   Sodium CANCELED mmol/L   Potassium CANCELED mmol/L   Chloride 118 (HH) 96 - 106 mmol/L   CO2 16 (L) 20 - 29 mmol/L   Calcium 10.6 (H) 8.7 - 10.2 mg/dL   Total Protein 8.2 6.0 - 8.5 g/dL   Albumin 5.3 (H) 4.1 - 5.1 g/dL   Globulin, Total 2.9 1.5 - 4.5 g/dL   Bilirubin Total <7.8 0.0 - 1.2 mg/dL   Alkaline Phosphatase 84 44 - 121 IU/L   AST 23 0 - 40 IU/L   ALT 22 0 - 44 IU/L  TSH  Result Value Ref Range   TSH 1.120 0.450 - 4.500 uIU/mL  Hemoglobin A1c  Result Value Ref Range   Hgb A1c MFr Bld 5.6 4.8 - 5.6 %   Est. average glucose Bld gHb Est-mCnc 114 mg/dL  Vitamin G95  Result Value Ref Range   Vitamin B-12 479 232 - 1,245 pg/mL  CBC with Diff  Result Value Ref Range   WBC 9.0 3.4 - 10.8 x10E3/uL   RBC 5.45 4.14 - 5.80 x10E6/uL   Hemoglobin 16.3 13.0 - 17.7 g/dL   Hematocrit 62.1 30.8 - 51.0 %   MCV 89 79 - 97 fL   MCH 29.9 26.6 - 33.0 pg   MCHC 33.5 31.5 - 35.7 g/dL   RDW 65.7 84.6 - 96.2 %   Platelets 265 150 - 450 x10E3/uL   Neutrophils 51 Not Estab. %   Lymphs 38 Not Estab. %   Monocytes 7 Not Estab. %   Eos 3 Not Estab. %   Basos 1 Not Estab. %   Neutrophils Absolute 4.5 1.4 - 7.0 x10E3/uL   Lymphocytes Absolute 3.4 (H) 0.7 - 3.1 x10E3/uL   Monocytes Absolute 0.6 0.1 - 0.9 x10E3/uL   EOS (ABSOLUTE) 0.3 0.0 - 0.4 x10E3/uL   Basophils Absolute 0.1 0.0 - 0.2 x10E3/uL   Immature Granulocytes 0 Not Estab. %   Immature Grans (Abs) 0.0 0.0 - 0.1 x10E3/uL  Hepatitis C  Ab reflex to Quant PCR  Result Value Ref Range   HCV Ab Non Reactive Non Reactive  Interpretation:  Result Value Ref Range   HCV Interp 1: Comment     Recent Results (from the past 2160 hours)  Lipid  panel     Status: Abnormal   Collection Time: 04/18/23  3:41 PM  Result Value Ref Range   Cholesterol, Total 285 (H) 100 - 199 mg/dL   Triglycerides CANCELED mg/dL    Comment: [facility] was unable to collect sufficient specimen to perform the following test(s), and is providing the patient with re-collection instructions.  Result canceled by the ancillary.    HDL 86 >39 mg/dL   VLDL Cholesterol Cal CANCELED mg/dL    Comment: Unable to calculate result since non-numeric result obtained for component test.  Result canceled by the ancillary.    LDL Chol Calc (NIH) CANCELED mg/dL    Comment: Unable to calculate result since non-numeric result obtained for component test.  Result canceled by the ancillary.    LDL CALC COMMENT: CANCELED     Comment: Unable to calculate result since non-numeric result obtained for component test.  Result canceled by the ancillary.    Chol/HDL Ratio 3.3 0.0 - 5.0 ratio    Comment:                                   T. Chol/HDL Ratio                                             Men  Women                               1/2 Avg.Risk  3.4    3.3                                   Avg.Risk  5.0    4.4                                2X Avg.Risk  9.6    7.1                                3X Avg.Risk 23.4   11.0   VITAMIN D 25 Hydroxy (Vit-D Deficiency, Fractures)     Status: None   Collection Time: 04/18/23  3:41 PM  Result Value Ref Range   Vit D, 25-Hydroxy 41.1 30.0 - 100.0 ng/mL    Comment: Vitamin D deficiency has been defined by the Institute of Medicine and an Endocrine Society practice guideline as a level of serum 25-OH vitamin D less than 20 ng/mL (1,2). The Endocrine Society went on to further define vitamin D insufficiency as a level between 21 and 29 ng/mL (2). 1. IOM (Institute of Medicine). 2010. Dietary reference    intakes for calcium and D. [place]: The    [employer]. 2. Holick MF, Binkley Fieldon, Bischoff-Ferrari HA,  et al.    Evaluation, treatment, and prevention of vitamin D    deficiency: an Endocrine  Society clinical practice    guideline. JCEM. 2011 Jul; 96(7):1911-30.   CMP14+EGFR     Status: Abnormal   Collection Time: 04/18/23  3:41 PM  Result Value Ref Range   Glucose CANCELED mg/dL    Comment: Test not performed.  Serum was in contact with cells when received which will make the result inaccurate.  Result canceled by the ancillary.    BUN 9 6 - 20 mg/dL   Creatinine, Ser 1.30 0.76 - 1.27 mg/dL   eGFR 865 >78 IO/NGE/9.52   BUN/Creatinine Ratio 9 9 - 20   Sodium CANCELED mmol/L    Comment: [facility] was unable to collect sufficient specimen to perform the following test(s), and is providing the patient with re-collection instructions.  Result canceled by the ancillary.    Potassium CANCELED mmol/L    Comment: Test not performed.  Serum was in contact with cells when received which will make the result inaccurate.  Result canceled by the ancillary.    Chloride 118 (HH) 96 - 106 mmol/L   CO2 16 (L) 20 - 29 mmol/L   Calcium 10.6 (H) 8.7 - 10.2 mg/dL   Total Protein 8.2 6.0 - 8.5 g/dL   Albumin 5.3 (H) 4.1 - 5.1 g/dL   Globulin, Total 2.9 1.5 - 4.5 g/dL   Bilirubin Total <8.4 0.0 - 1.2 mg/dL   Alkaline Phosphatase 84 44 - 121 IU/L   AST 23 0 - 40 IU/L   ALT 22 0 - 44 IU/L  TSH     Status: None   Collection Time: 04/18/23  3:41 PM  Result Value Ref Range   TSH 1.120 0.450 - 4.500 uIU/mL  Hemoglobin A1c     Status: None   Collection Time: 04/18/23  3:41 PM  Result Value Ref Range   Hgb A1c MFr Bld 5.6 4.8 - 5.6 %    Comment:          Prediabetes: 5.7 - 6.4          Diabetes: >6.4          Glycemic control for adults with diabetes: <7.0    Est. average glucose Bld gHb Est-mCnc 114 mg/dL  Vitamin X32     Status: None   Collection Time: 04/18/23  3:41 PM  Result Value Ref Range   Vitamin B-12 479 232 - 1,245 pg/mL  CBC with Diff     Status: Abnormal   Collection Time:  04/18/23  3:41 PM  Result Value Ref Range   WBC 9.0 3.4 - 10.8 x10E3/uL   RBC 5.45 4.14 - 5.80 x10E6/uL   Hemoglobin 16.3 13.0 - 17.7 g/dL   Hematocrit 44.0 10.2 - 51.0 %   MCV 89 79 - 97 fL   MCH 29.9 26.6 - 33.0 pg   MCHC 33.5 31.5 - 35.7 g/dL   RDW 72.5 36.6 - 44.0 %   Platelets 265 150 - 450 x10E3/uL   Neutrophils 51 Not Estab. %   Lymphs 38 Not Estab. %   Monocytes 7 Not Estab. %   Eos 3 Not Estab. %   Basos 1 Not Estab. %   Neutrophils Absolute 4.5 1.4 - 7.0 x10E3/uL   Lymphocytes Absolute 3.4 (H) 0.7 - 3.1 x10E3/uL   Monocytes Absolute 0.6 0.1 - 0.9 x10E3/uL   EOS (ABSOLUTE) 0.3 0.0 - 0.4 x10E3/uL   Basophils Absolute 0.1 0.0 - 0.2 x10E3/uL   Immature Granulocytes 0 Not Estab. %   Immature Grans (Abs) 0.0 0.0 - 0.1 x10E3/uL  Hepatitis C Ab reflex to Quant PCR     Status: None   Collection Time: 04/18/23  3:41 PM  Result Value Ref Range   HCV Ab Non Reactive Non Reactive  Interpretation:     Status: None   Collection Time: 04/18/23  3:41 PM  Result Value Ref Range   HCV Interp 1: Comment     Comment: Not infected with HCV unless early or acute infection is suspected (which may be delayed in an immunocompromised individual), or other evidence exists to indicate HCV infection.        Assessment & Plan:   Problem List Items Addressed This Visit       Endocrine   Hypothyroidism (acquired)   Patient stable.  Well controlled with current therapy.   Continue current meds.         Relevant Orders   CMP14+EGFR (Completed)   TSH (Completed)   CBC with Diff (Completed)     Other   Bipolar 1 disorder (HCC)   Patient stable.  Well controlled with current therapy.   Continue current meds.        Obesity, morbid (HCC) - Primary   Continue current meds.  Will adjust as needed based on results.  The patient is asked to make an attempt to improve diet and exercise patterns to aid in medical management of this problem. Addressed importance of increasing and  maintaining water intake.        Relevant Orders   CMP14+EGFR (Completed)   CBC with Diff (Completed)   Mixed hyperlipidemia   Checking labs today.  Continue current therapy for lipid control. Will modify as needed based on labwork results.        Relevant Orders   Lipid panel (Completed)   CMP14+EGFR (Completed)   CBC with Diff (Completed)   Vitamin D deficiency, unspecified   Checking labs today.  Will continue supplements as needed.        Relevant Orders   VITAMIN D 25 Hydroxy (Vit-D Deficiency, Fractures) (Completed)   CMP14+EGFR (Completed)   CBC with Diff (Completed)   Other Visit Diagnoses       B12 deficiency due to diet       Checking labs today.  Will continue supplements as needed.   Relevant Orders   CMP14+EGFR (Completed)   Vitamin B12 (Completed)   CBC with Diff (Completed)     Need for hepatitis C screening test       Test ordered in office today. Will call with results.   Relevant Orders   CMP14+EGFR (Completed)   CBC with Diff (Completed)   Hepatitis C Ab reflex to Quant PCR (Completed)     Screening for HIV without presence of risk factors       Test ordered in office today. Will call with results.   Relevant Orders   CMP14+EGFR (Completed)   CBC with Diff (Completed)     Prediabetes       A1C is in prediabetic ranges. Patient counseled on dietary choices and verbalized understanding. Will reassess at follow up after next lab check.   Relevant Orders   CMP14+EGFR (Completed)   Hemoglobin A1c (Completed)   CBC with Diff (Completed)     Essential hypertension, benign       Blood pressure well controlled with current medications.  Continue current therapy.  Will reassess at follow up.   Relevant Orders   CMP14+EGFR (Completed)   CBC with Diff (Completed)       Return  in about 6 months (around 10/16/2023).   Total time spent: 20 minutes  Miki Kins, FNP  04/18/2023   This document may have been prepared by [facility] Voice  Recognition software and as such may include unintentional dictation errors.

## 2023-06-11 NOTE — Assessment & Plan Note (Signed)
 Patient stable.  Well controlled with current therapy.   Continue current meds.

## 2023-06-11 NOTE — Assessment & Plan Note (Signed)
 Checking labs today.  Continue current therapy for lipid control. Will modify as needed based on labwork results.

## 2023-06-11 NOTE — Assessment & Plan Note (Signed)
 Continue current meds.  Will adjust as needed based on results.  The patient is asked to make an attempt to improve diet and exercise patterns to aid in medical management of this problem. Addressed importance of increasing and maintaining water intake.

## 2023-08-03 ENCOUNTER — Other Ambulatory Visit: Payer: Self-pay | Admitting: Family

## 2023-09-01 ENCOUNTER — Other Ambulatory Visit: Payer: Self-pay | Admitting: Family

## 2023-09-29 ENCOUNTER — Other Ambulatory Visit: Payer: Self-pay | Admitting: Family

## 2023-10-17 ENCOUNTER — Ambulatory Visit: Payer: Medicaid Other | Admitting: Family

## 2023-11-04 ENCOUNTER — Other Ambulatory Visit: Payer: Self-pay | Admitting: Family

## 2023-12-01 ENCOUNTER — Other Ambulatory Visit: Payer: Self-pay | Admitting: Family

## 2024-01-23 ENCOUNTER — Other Ambulatory Visit: Payer: Self-pay | Admitting: Family

## 2024-02-13 ENCOUNTER — Other Ambulatory Visit: Payer: Self-pay | Admitting: Family

## 2024-02-18 ENCOUNTER — Other Ambulatory Visit: Payer: Self-pay | Admitting: Family
